# Patient Record
Sex: Female | Born: 1990 | Race: White | Hispanic: No | Marital: Single | Smoking: Current every day smoker
Health system: Southern US, Community
[De-identification: ages and names within clinical notes are randomized; demographics above are authoritative.]

## PROBLEM LIST (undated history)

## (undated) ENCOUNTER — Inpatient Hospital Stay (HOSPITAL_COMMUNITY): Payer: Self-pay

## (undated) DIAGNOSIS — F99 Mental disorder, not otherwise specified: Secondary | ICD-10-CM

## (undated) DIAGNOSIS — IMO0002 Reserved for concepts with insufficient information to code with codable children: Secondary | ICD-10-CM

## (undated) DIAGNOSIS — G8929 Other chronic pain: Secondary | ICD-10-CM

## (undated) DIAGNOSIS — T8859XA Other complications of anesthesia, initial encounter: Secondary | ICD-10-CM

## (undated) DIAGNOSIS — M549 Dorsalgia, unspecified: Secondary | ICD-10-CM

## (undated) HISTORY — PX: WISDOM TOOTH EXTRACTION: SHX21

## (undated) HISTORY — DX: Mental disorder, not otherwise specified: F99

## (undated) HISTORY — PX: NO PAST SURGERIES: SHX2092

## (undated) HISTORY — DX: Morbid (severe) obesity due to excess calories: E66.01

## (undated) HISTORY — DX: Other complications of anesthesia, initial encounter: T88.59XA

---
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
## 2010-09-04 ENCOUNTER — Emergency Department (HOSPITAL_COMMUNITY)
Admission: EM | Admit: 2010-09-04 | Discharge: 2010-09-04 | Disposition: A | Discharge status: Home / Self Care | Payer: Medicaid Other | Attending: Emergency Medicine | Admitting: Emergency Medicine

## 2010-09-04 DIAGNOSIS — M549 Dorsalgia, unspecified: Secondary | ICD-10-CM | POA: Insufficient documentation

## 2010-09-04 DIAGNOSIS — G8929 Other chronic pain: Secondary | ICD-10-CM | POA: Insufficient documentation

## 2010-09-04 DIAGNOSIS — M25539 Pain in unspecified wrist: Secondary | ICD-10-CM | POA: Insufficient documentation

## 2010-09-04 DIAGNOSIS — J45909 Unspecified asthma, uncomplicated: Secondary | ICD-10-CM | POA: Insufficient documentation

## 2010-09-04 DIAGNOSIS — Z79899 Other long term (current) drug therapy: Secondary | ICD-10-CM | POA: Insufficient documentation

## 2010-09-04 LAB — POCT PREGNANCY, URINE: Preg Test, Ur: NEGATIVE

## 2010-11-14 ENCOUNTER — Emergency Department (HOSPITAL_COMMUNITY)
Admission: EM | Admit: 2010-11-14 | Discharge: 2010-11-15 | Disposition: A | Discharge status: Home / Self Care | Payer: Medicaid Other | Attending: Emergency Medicine | Admitting: Emergency Medicine

## 2010-11-14 DIAGNOSIS — R21 Rash and other nonspecific skin eruption: Secondary | ICD-10-CM | POA: Insufficient documentation

## 2010-12-04 ENCOUNTER — Emergency Department (HOSPITAL_COMMUNITY)
Admission: EM | Admit: 2010-12-04 | Discharge: 2010-12-05 | Disposition: A | Discharge status: Home / Self Care | Payer: Medicaid Other | Attending: Emergency Medicine | Admitting: Emergency Medicine

## 2010-12-04 DIAGNOSIS — IMO0002 Reserved for concepts with insufficient information to code with codable children: Secondary | ICD-10-CM | POA: Insufficient documentation

## 2010-12-04 DIAGNOSIS — M542 Cervicalgia: Secondary | ICD-10-CM | POA: Insufficient documentation

## 2010-12-04 DIAGNOSIS — R110 Nausea: Secondary | ICD-10-CM | POA: Insufficient documentation

## 2010-12-04 DIAGNOSIS — W010XXA Fall on same level from slipping, tripping and stumbling without subsequent striking against object, initial encounter: Secondary | ICD-10-CM | POA: Insufficient documentation

## 2010-12-04 DIAGNOSIS — R109 Unspecified abdominal pain: Secondary | ICD-10-CM | POA: Insufficient documentation

## 2010-12-05 ENCOUNTER — Emergency Department (HOSPITAL_COMMUNITY): Payer: Medicaid Other

## 2010-12-05 LAB — URINALYSIS, ROUTINE W REFLEX MICROSCOPIC
Hgb urine dipstick: NEGATIVE
Ketones, ur: NEGATIVE mg/dL
Specific Gravity, Urine: 1.022 (ref 1.005–1.030)
Urobilinogen, UA: 1 mg/dL (ref 0.0–1.0)

## 2011-02-08 ENCOUNTER — Emergency Department (HOSPITAL_COMMUNITY)
Admission: EM | Admit: 2011-02-08 | Discharge: 2011-02-09 | Disposition: A | Discharge status: Home / Self Care | Payer: Self-pay | Attending: Emergency Medicine | Admitting: Emergency Medicine

## 2011-02-08 DIAGNOSIS — R109 Unspecified abdominal pain: Secondary | ICD-10-CM | POA: Insufficient documentation

## 2011-02-08 DIAGNOSIS — R1011 Right upper quadrant pain: Secondary | ICD-10-CM | POA: Insufficient documentation

## 2011-02-08 DIAGNOSIS — R110 Nausea: Secondary | ICD-10-CM | POA: Insufficient documentation

## 2011-02-08 DIAGNOSIS — J45909 Unspecified asthma, uncomplicated: Secondary | ICD-10-CM | POA: Insufficient documentation

## 2011-02-08 LAB — URINALYSIS, ROUTINE W REFLEX MICROSCOPIC
Glucose, UA: NEGATIVE mg/dL
Hgb urine dipstick: NEGATIVE
Ketones, ur: NEGATIVE mg/dL
Leukocytes, UA: NEGATIVE
pH: 5.5 (ref 5.0–8.0)

## 2011-02-09 ENCOUNTER — Emergency Department (HOSPITAL_COMMUNITY): Payer: Self-pay

## 2011-02-09 LAB — DIFFERENTIAL
Lymphocytes Relative: 30 % (ref 12–46)
Lymphs Abs: 2.4 10*3/uL (ref 0.7–4.0)
Monocytes Relative: 7 % (ref 3–12)
Neutrophils Relative %: 61 % (ref 43–77)

## 2011-02-09 LAB — COMPREHENSIVE METABOLIC PANEL
BUN: 8 mg/dL (ref 6–23)
CO2: 26 mEq/L (ref 19–32)
Chloride: 100 mEq/L (ref 96–112)
Creatinine, Ser: 0.66 mg/dL (ref 0.50–1.10)
GFR calc Af Amer: >60 mL/min (ref >60)
GFR calc non Af Amer: >60 mL/min (ref >60)
Glucose, Bld: 104 mg/dL — ABNORMAL HIGH (ref 70–99)
Total Bilirubin: 0.2 mg/dL — ABNORMAL LOW (ref 0.3–1.2)

## 2011-02-09 LAB — CBC
HCT: 39.7 % (ref 36.0–46.0)
MCV: 84.8 fL (ref 78.0–100.0)
RBC: 4.68 MIL/uL (ref 3.87–5.11)
WBC: 8.1 10*3/uL (ref 4.0–10.5)

## 2011-02-09 LAB — LIPASE, BLOOD: Lipase: 25 U/L (ref 11–59)

## 2011-04-09 ENCOUNTER — Inpatient Hospital Stay (HOSPITAL_COMMUNITY): Payer: Self-pay

## 2011-04-09 ENCOUNTER — Encounter (HOSPITAL_COMMUNITY): Payer: Self-pay | Admitting: *Deleted

## 2011-04-09 ENCOUNTER — Inpatient Hospital Stay (HOSPITAL_COMMUNITY)
Admission: AD | Admit: 2011-04-09 | Discharge: 2011-04-09 | Disposition: A | Discharge status: Home / Self Care | Payer: Self-pay | Source: Ambulatory Visit | Attending: Obstetrics & Gynecology | Admitting: Obstetrics & Gynecology

## 2011-04-09 DIAGNOSIS — O219 Vomiting of pregnancy, unspecified: Secondary | ICD-10-CM

## 2011-04-09 DIAGNOSIS — O210 Mild hyperemesis gravidarum: Secondary | ICD-10-CM | POA: Insufficient documentation

## 2011-04-09 HISTORY — DX: Dorsalgia, unspecified: M54.9

## 2011-04-09 HISTORY — DX: Other chronic pain: G89.29

## 2011-04-09 HISTORY — DX: Reserved for concepts with insufficient information to code with codable children: IMO0002

## 2011-04-09 LAB — HCG, QUANTITATIVE, PREGNANCY: hCG, Beta Chain, Quant, S: 8444 m[IU]/mL — ABNORMAL HIGH (ref <5)

## 2011-04-09 LAB — URINALYSIS, ROUTINE W REFLEX MICROSCOPIC
Glucose, UA: NEGATIVE mg/dL
Ketones, ur: NEGATIVE mg/dL
Leukocytes, UA: NEGATIVE
Nitrite: NEGATIVE
Protein, ur: NEGATIVE mg/dL

## 2011-04-09 LAB — WET PREP, GENITAL: Trich, Wet Prep: NONE SEEN

## 2011-04-09 LAB — POCT PREGNANCY, URINE: Preg Test, Ur: POSITIVE

## 2011-04-09 LAB — CBC
MCH: 27.7 pg (ref 26.0–34.0)
MCHC: 32.7 g/dL (ref 30.0–36.0)
Platelets: 270 10*3/uL (ref 150–400)
RBC: 4.65 MIL/uL (ref 3.87–5.11)

## 2011-04-09 MED ORDER — PROMETHAZINE HCL 25 MG PO TABS: 12.5000 mg | ORAL_TABLET | Freq: Four times a day (QID) | ORAL | Status: AC | PRN

## 2011-04-09 NOTE — Progress Notes (Signed)
Patient states she had a positive home pregnancy test on 10-26. Has had nausea and vomiting on and off since 10-27. Has some upper abdominal pain on and off with vomiting. Occasional lower sharp pain (twice in the past 4 days not today) Has not had any vomiting today. No bleeding or discharge.

## 2011-04-09 NOTE — ED Provider Notes (Signed)
History     CSN: [id number] Arrival date & time: 04/09/2011  3:56 PM   None     Chief Complaint  Patient presents with  . Emesis    HPI Cassandra Harrison is a 20 y.o. female who presents to MAU for nausea, vomiting, lower abdominal cramping and breast tenderness. She is past due for her period this month.  The history was provided by the patient.  Past Medical History  Diagnosis Date  . Asthma   . Chronic back pain   . Migraine   . Bulging disc     Past Surgical History  Procedure Date  . No past surgeries     No family history on file.  History  Substance Use Topics  . Smoking status: Current Everyday Smoker  . Smokeless tobacco: Not on file  . Alcohol Use: No    OB History    Grav Para Term Preterm Abortions TAB SAB Ect Mult Living   1               Review of Systems  Constitutional: Positive for fatigue. Negative for fever, chills and diaphoresis.  HENT: Negative for ear pain, congestion, sore throat, facial swelling, neck pain, neck stiffness, dental problem and sinus pressure.   Eyes: Negative for photophobia, pain and discharge.  Respiratory: Negative for cough, chest tightness and wheezing.   Gastrointestinal: Positive for nausea, vomiting and abdominal pain. Negative for diarrhea, constipation and abdominal distention.  Genitourinary: Positive for frequency and vaginal discharge. Negative for dysuria, flank pain, vaginal bleeding and difficulty urinating.  Musculoskeletal: Positive for back pain. Negative for myalgias and gait problem.       Chronic back pain.  Skin: Negative for color change and rash.  Neurological: Negative for dizziness, speech difficulty, weakness, light-headedness, numbness and headaches.  Psychiatric/Behavioral: Negative for confusion and agitation. The patient is not nervous/anxious.     Allergies  Latex  Home Medications  No current outpatient prescriptions on file.  BP 122/67  Pulse 91  Temp(Src) 98.6 F (37 C) (Oral)   Resp 16  Ht 5' 2.5" (1.588 m)  Wt 178 lb 6.4 oz (80.922 kg)  BMI 32.11 kg/m2  SpO2 99%  LMP 03/09/2011  Physical Exam  Nursing note and vitals reviewed. Constitutional: She is oriented to person, place, and time. She appears well-developed and well-nourished. No distress.  HENT:  Head: Normocephalic.  Eyes: EOM are normal.  Neck: Neck supple.  Cardiovascular: Normal rate.   Pulmonary/Chest: Effort normal.  Abdominal: Soft. There is no tenderness.  Genitourinary:       External genitalia without lesions. White discharge vaginal vault. Cervix closed and long. No CMT, no adnexal tenderness or mass palpable. Uterus with minimal enlargement.  Musculoskeletal: Normal range of motion.  Neurological: She is alert and oriented to person, place, and time. No cranial nerve deficit.  Skin: Skin is warm and dry.   Results for orders placed during the hospital encounter of 04/09/11 (from the past 24 hour(s))  HCG, QUANTITATIVE, PREGNANCY     Status: Abnormal   Collection Time   04/09/11  4:07 PM      Component Value Range   hCG, Beta Chain, Quant, S 8444 (*) <5 (mIU/mL)  URINALYSIS, ROUTINE W REFLEX MICROSCOPIC     Status: Normal   Collection Time   04/09/11  4:40 PM      Component Value Range   Color, Urine YELLOW  YELLOW    Appearance CLEAR  CLEAR    Specific  Gravity, Urine 1.025  1.005 - 1.030    pH 6.0  5.0 - 8.0    Glucose, UA NEGATIVE  NEGATIVE (mg/dL)   Hgb urine dipstick NEGATIVE  NEGATIVE    Bilirubin Urine NEGATIVE  NEGATIVE    Ketones, ur NEGATIVE  NEGATIVE (mg/dL)   Protein, ur NEGATIVE  NEGATIVE (mg/dL)   Urobilinogen, UA 0.2  0.0 - 1.0 (mg/dL)   Nitrite NEGATIVE  NEGATIVE    Leukocytes, UA NEGATIVE  NEGATIVE   POCT PREGNANCY, URINE     Status: Normal   Collection Time   04/09/11  5:00 PM      Component Value Range   Preg Test, Ur POSITIVE    WET PREP, GENITAL     Status: Abnormal   Collection Time   04/09/11  5:07 PM      Component Value Range   Yeast, Wet Prep  NONE SEEN  NONE SEEN    Trich, Wet Prep NONE SEEN  NONE SEEN    Clue Cells, Wet Prep NONE SEEN  NONE SEEN    WBC, Wet Prep HPF POC MANY (*) NONE SEEN   ABO/RH     Status: Normal   Collection Time   04/09/11  5:16 PM      Component Value Range   ABO/RH(D) B NEG    CBC     Status: Normal   Collection Time   04/09/11  5:16 PM      Component Value Range   WBC 7.7  4.0 - 10.5 (K/uL)   RBC 4.65  3.87 - 5.11 (MIL/uL)   Hemoglobin 12.9  12.0 - 15.0 (g/dL)   HCT 40.9  81.1 - 91.4 (%)   MCV 84.7  78.0 - 100.0 (fL)   MCH 27.7  26.0 - 34.0 (pg)   MCHC 32.7  30.0 - 36.0 (g/dL)   RDW 78.2  95.6 - 21.3 (%)   Platelets 270  150 - 400 (K/uL)   Assessment: Nausea and vomiting in early pregnancy  Plan:  Phenergan 12.5 mg po q 4 hours prn   Start prenatal care   Return here as needed for problems.  ED Course  Procedures  MDM          Kerrie Buffalo, NP 04/09/11 [phone]

## 2011-04-09 NOTE — Progress Notes (Signed)
Pt in c/o nausea and vomiting since Saturday, none today.  Also reports a dull, lower abdominal constant pain with intermittent sharp pains on left side (x1 Friday, x1 yesterday).  Denies any discharge or bleeding.

## 2011-05-29 ENCOUNTER — Inpatient Hospital Stay (HOSPITAL_COMMUNITY)
Admission: AD | Admit: 2011-05-29 | Discharge: 2011-05-29 | Disposition: A | Discharge status: Home / Self Care | Payer: Medicaid Other | Source: Ambulatory Visit | Attending: Obstetrics & Gynecology | Admitting: Obstetrics & Gynecology

## 2011-05-29 ENCOUNTER — Encounter (HOSPITAL_COMMUNITY): Payer: Self-pay | Admitting: *Deleted

## 2011-05-29 DIAGNOSIS — O210 Mild hyperemesis gravidarum: Secondary | ICD-10-CM | POA: Insufficient documentation

## 2011-05-29 DIAGNOSIS — O26899 Other specified pregnancy related conditions, unspecified trimester: Secondary | ICD-10-CM

## 2011-05-29 DIAGNOSIS — O219 Vomiting of pregnancy, unspecified: Secondary | ICD-10-CM

## 2011-05-29 DIAGNOSIS — O99891 Other specified diseases and conditions complicating pregnancy: Secondary | ICD-10-CM | POA: Insufficient documentation

## 2011-05-29 DIAGNOSIS — R109 Unspecified abdominal pain: Secondary | ICD-10-CM | POA: Insufficient documentation

## 2011-05-29 DIAGNOSIS — K219 Gastro-esophageal reflux disease without esophagitis: Secondary | ICD-10-CM | POA: Insufficient documentation

## 2011-05-29 LAB — URINALYSIS, ROUTINE W REFLEX MICROSCOPIC
Bilirubin Urine: NEGATIVE
Glucose, UA: NEGATIVE mg/dL
Hgb urine dipstick: NEGATIVE
Ketones, ur: 15 mg/dL — AB
Protein, ur: NEGATIVE mg/dL

## 2011-05-29 LAB — CBC
HCT: 36.2 % (ref 36.0–46.0)
MCV: 82.8 fL (ref 78.0–100.0)
RDW: 12.9 % (ref 11.5–15.5)
WBC: 7 10*3/uL (ref 4.0–10.5)

## 2011-05-29 LAB — COMPREHENSIVE METABOLIC PANEL
Albumin: 3.4 g/dL — ABNORMAL LOW (ref 3.5–5.2)
BUN: 8 mg/dL (ref 6–23)
CO2: 22 mEq/L (ref 19–32)
Chloride: 100 mEq/L (ref 96–112)
Creatinine, Ser: 0.54 mg/dL (ref 0.50–1.10)
GFR calc non Af Amer: >90 mL/min (ref >90)
Total Bilirubin: 0.3 mg/dL (ref 0.3–1.2)

## 2011-05-29 MED ORDER — ONDANSETRON 4 MG PO TBDP
4.0000 mg | ORAL_TABLET | Freq: Three times a day (TID) | ORAL | Status: AC | PRN
Start: 2011-05-29 — End: 2011-06-05

## 2011-05-29 MED ORDER — LACTATED RINGERS IV BOLUS (SEPSIS)
1000.0000 mL | Freq: Once | INTRAVENOUS | Status: AC
  Administered 2011-05-29: 1000 mL via INTRAVENOUS

## 2011-05-29 MED ORDER — ONDANSETRON 8 MG PO TBDP
8.0000 mg | ORAL_TABLET | Freq: Once | ORAL | Status: AC
  Administered 2011-05-29: 8 mg via ORAL
  Filled 2011-05-29: qty 1

## 2011-05-29 MED ORDER — GI COCKTAIL ~~LOC~~
30.0000 mL | Freq: Once | ORAL | Status: AC
  Administered 2011-05-29: 30 mL via ORAL
  Filled 2011-05-29: qty 30

## 2011-05-29 MED ORDER — FAMOTIDINE 20 MG PO TABS: 20.0000 mg | ORAL_TABLET | Freq: Two times a day (BID) | ORAL | Status: DC

## 2011-05-29 NOTE — Progress Notes (Signed)
Pt states her stomach has hurt, she has been nauseated and had 1 epsiode of diarrhea this morning

## 2011-05-29 NOTE — Progress Notes (Signed)
Patient is here with c/o n/v and abdominal cramping all day. She states that she is unable to keep fluids and meal down. Denies any vaginal bleeding, or discharge

## 2011-05-29 NOTE — ED Provider Notes (Signed)
History     Chief Complaint  Patient presents with  . Abdominal Pain   HPI 20 y.o. G1P0 at [redacted]w[redacted]d with abd pain, has been having generalized abdominal pain only when hungry since onset of pregnancy, pain worse today, not relieved by eating, tried cereal with milk and McChicken Sandwich. Nausea, no vomiting, 1 episode of diarrhea this morning. No pelvic pain or vaginal bleeding/discharge. Has not tried any meds for her symptoms. Planning on prenatal care at [facility].    Past Medical History  Diagnosis Date  . Asthma   . Chronic back pain   . Migraine   . Bulging disc   . Constipation     Past Surgical History  Procedure Date  . No past surgeries     History reviewed. No pertinent family history.  History  Substance Use Topics  . Smoking status: Current Everyday Smoker  . Smokeless tobacco: Not on file  . Alcohol Use: No    Allergies:  Allergies  Allergen Reactions  . Latex Rash    Patient states that she is allergic to latex condoms.  They give her a rash and a yeast infection.    Prescriptions prior to admission  Medication Sig Dispense Refill  . albuterol (PROVENTIL HFA;VENTOLIN HFA) 108 (90 BASE) MCG/ACT inhaler Inhale into the lungs every 6 (six) hours as needed.        [name] ibuprofen (ADVIL,MOTRIN) 200 MG tablet Take 400 mg by mouth every 6 (six) hours as needed. Patient took this medication for a headache.         Review of Systems  Constitutional: Negative.   Respiratory: Negative.   Cardiovascular: Negative.   Gastrointestinal: Positive for nausea, abdominal pain and diarrhea. Negative for vomiting and constipation.  Genitourinary: Negative for dysuria, urgency, frequency, hematuria and flank pain.       Negative for vaginal bleeding, vaginal discharge  Musculoskeletal: Negative.   Neurological: Negative.   Psychiatric/Behavioral: Negative.    Physical Exam   Blood pressure 114/58, pulse 90, temperature 99 F (37.2 C), temperature source Oral, resp. rate  20, height 5\' 2"  (1.575 m), weight 177 lb (80.287 kg), last menstrual period 03/09/2011, SpO2 99.00%.  Physical Exam  Nursing note and vitals reviewed. Constitutional: She is oriented to person, place, and time. She appears well-developed and well-nourished. No distress.  Cardiovascular: Normal rate.   Respiratory: Effort normal. No respiratory distress.  GI: Soft. She exhibits no distension and no mass. There is no tenderness. There is no rebound and no guarding.  Musculoskeletal: Normal range of motion.  Neurological: She is alert and oriented to person, place, and time.  Skin: Skin is warm and dry.  Psychiatric: She has a normal mood and affect.    MAU Course  Procedures Results for orders placed during the hospital encounter of 05/29/11 (from the past 24 hour(s))  URINALYSIS, ROUTINE W REFLEX MICROSCOPIC     Status: Abnormal   Collection Time   05/29/11  9:00 PM      Component Value Range   Color, Urine YELLOW  YELLOW    APPearance CLEAR  CLEAR    Specific Gravity, Urine >1.030 (*) 1.005 - 1.030    pH 6.0  5.0 - 8.0    Glucose, UA NEGATIVE  NEGATIVE (mg/dL)   Hgb urine dipstick NEGATIVE  NEGATIVE    Bilirubin Urine NEGATIVE  NEGATIVE    Ketones, ur 15 (*) NEGATIVE (mg/dL)   Protein, ur NEGATIVE  NEGATIVE (mg/dL)   Urobilinogen, UA 1.0  0.0 -  1.0 (mg/dL)   Nitrite NEGATIVE  NEGATIVE    Leukocytes, UA NEGATIVE  NEGATIVE   CBC     Status: Normal   Collection Time   05/29/11  9:25 PM      Component Value Range   WBC 7.0  4.0 - 10.5 (K/uL)   RBC 4.37  3.87 - 5.11 (MIL/uL)   Hemoglobin 12.2  12.0 - 15.0 (g/dL)   HCT 40.9  81.1 - 91.4 (%)   MCV 82.8  78.0 - 100.0 (fL)   MCH 27.9  26.0 - 34.0 (pg)   MCHC 33.7  30.0 - 36.0 (g/dL)   RDW 78.2  95.6 - 21.3 (%)   Platelets 199  150 - 400 (K/uL)  COMPREHENSIVE METABOLIC PANEL     Status: Abnormal   Collection Time   05/29/11  9:25 PM      Component Value Range   Sodium 132 (*) 135 - 145 (mEq/L)   Potassium 3.8  3.5 - 5.1  (mEq/L)   Chloride 100  96 - 112 (mEq/L)   CO2 22  19 - 32 (mEq/L)   Glucose, Bld 99  70 - 99 (mg/dL)   BUN 8  6 - 23 (mg/dL)   Creatinine, Ser 0.86  0.50 - 1.10 (mg/dL)   Calcium 9.6  8.4 - 57.8 (mg/dL)   Total Protein 6.8  6.0 - 8.3 (g/dL)   Albumin 3.4 (*) 3.5 - 5.2 (g/dL)   AST 13  0 - 37 (U/L)   ALT 11  0 - 35 (U/L)   Alkaline Phosphatase 48  39 - 117 (U/L)   Total Bilirubin 0.3  0.3 - 1.2 (mg/dL)   GFR calc non Af Amer >90  >90 (mL/min)   GFR calc Af Amer >90  >90 (mL/min)   IV hydration, nausea and pain improved with Zofran ODT and GI cocktail, tolerating po gingerale and crackers  Assessment and Plan  20 y.o. G1P0 at [redacted]w[redacted]d Pregnancy nausea/GERD - rx Zofran and Pepcid F/U for prenatal care as soon as possible  Linnette Panella 05/29/2011, 9:18 PM

## 2011-06-12 NOTE — L&D Delivery Note (Signed)
Delivery Note At 8:02 AM a viable unspecified sex was delivered via Vaginal, Spontaneous Delivery (Presentation: ;  ).  APGAR: , ; weight .   Placenta status: , .  Cord: 3 vessels with the following complications: None.  Cord pH: not done  Anesthesia: Epidural  Episiotomy: None Lacerations: None Suture Repair: 2.0 Est. Blood Loss (mL):   Mom to postpartum.  Baby to nursery-stable.  Mihika Surrette A 12/08/2011, 8:10 AM

## 2011-07-04 LAB — OB RESULTS CONSOLE RPR
RPR: NONREACTIVE
RPR: NONREACTIVE

## 2011-07-04 LAB — OB RESULTS CONSOLE HIV ANTIBODY (ROUTINE TESTING): HIV: NONREACTIVE

## 2011-09-18 ENCOUNTER — Other Ambulatory Visit: Payer: Self-pay | Admitting: Obstetrics

## 2011-09-18 ENCOUNTER — Encounter (HOSPITAL_COMMUNITY): Payer: Self-pay | Admitting: *Deleted

## 2011-09-18 ENCOUNTER — Inpatient Hospital Stay (HOSPITAL_COMMUNITY)
Admission: AD | Admit: 2011-09-18 | Discharge: 2011-09-18 | Disposition: A | Discharge status: Home / Self Care | Payer: Medicaid Other | Source: Ambulatory Visit | Attending: Obstetrics | Admitting: Obstetrics

## 2011-09-18 ENCOUNTER — Other Ambulatory Visit (HOSPITAL_COMMUNITY): Payer: Self-pay | Admitting: *Deleted

## 2011-09-18 DIAGNOSIS — Z2989 Encounter for other specified prophylactic measures: Secondary | ICD-10-CM | POA: Insufficient documentation

## 2011-09-18 DIAGNOSIS — Z298 Encounter for other specified prophylactic measures: Secondary | ICD-10-CM | POA: Insufficient documentation

## 2011-09-18 MED ORDER — RHO D IMMUNE GLOBULIN 1500 UNIT/2ML IJ SOLN
300.0000 ug | Freq: Once | INTRAMUSCULAR | Status: AC
  Administered 2011-09-18: 300 ug via INTRAMUSCULAR
  Filled 2011-09-18: qty 2

## 2011-09-18 NOTE — MAU Note (Signed)
Pt states, " I am here for Rhogam."

## 2011-09-19 LAB — RH IG WORKUP (INCLUDES ABO/RH)
ABO/RH(D): B NEG
Gestational Age(Wks): 28

## 2011-10-01 ENCOUNTER — Inpatient Hospital Stay (HOSPITAL_COMMUNITY)
Admission: AD | Admit: 2011-10-01 | Discharge: 2011-10-01 | Disposition: A | Discharge status: Hospice | Payer: Medicaid Other | Source: Ambulatory Visit | Attending: Obstetrics | Admitting: Obstetrics

## 2011-10-01 ENCOUNTER — Encounter (HOSPITAL_COMMUNITY): Payer: Self-pay | Admitting: *Deleted

## 2011-10-01 DIAGNOSIS — O99891 Other specified diseases and conditions complicating pregnancy: Secondary | ICD-10-CM | POA: Insufficient documentation

## 2011-10-01 DIAGNOSIS — R079 Chest pain, unspecified: Secondary | ICD-10-CM | POA: Insufficient documentation

## 2011-10-01 DIAGNOSIS — K30 Functional dyspepsia: Secondary | ICD-10-CM

## 2011-10-01 DIAGNOSIS — R1013 Epigastric pain: Secondary | ICD-10-CM

## 2011-10-01 DIAGNOSIS — O26899 Other specified pregnancy related conditions, unspecified trimester: Secondary | ICD-10-CM

## 2011-10-01 DIAGNOSIS — R109 Unspecified abdominal pain: Secondary | ICD-10-CM | POA: Insufficient documentation

## 2011-10-01 LAB — URINALYSIS, ROUTINE W REFLEX MICROSCOPIC
Bilirubin Urine: NEGATIVE
Glucose, UA: NEGATIVE mg/dL
Hgb urine dipstick: NEGATIVE
Ketones, ur: NEGATIVE mg/dL
Nitrite: NEGATIVE
Protein, ur: NEGATIVE mg/dL
Specific Gravity, Urine: 1.02 (ref 1.005–1.030)
Urobilinogen, UA: 0.2 mg/dL (ref 0.0–1.0)
pH: 6.5 (ref 5.0–8.0)

## 2011-10-01 MED ORDER — ONDANSETRON 8 MG PO TBDP
8.0000 mg | ORAL_TABLET | Freq: Once | ORAL | Status: AC
  Administered 2011-10-01: 8 mg via ORAL
  Filled 2011-10-01: qty 1

## 2011-10-01 MED ORDER — GI COCKTAIL ~~LOC~~
30.0000 mL | Freq: Once | ORAL | Status: AC
  Administered 2011-10-01: 30 mL via ORAL
  Filled 2011-10-01: qty 30

## 2011-10-01 NOTE — MAU Note (Signed)
Pt 's O2 Sat is 98-99%; laughing and talking with her friend at present'; pt does not appear to be having any distress with her heart or breathing; c/o pain over entire abdomen;;

## 2011-10-01 NOTE — MAU Provider Note (Signed)
History     CSN: [id number]  Arrival date and time: 10/01/11 5784   First Provider Initiated Contact with Patient 10/01/11 1854      Chief Complaint  Patient presents with  . Abdominal Pain  . Chest Pain   HPI Cassandra Harrison is 21 y.o. G1P0000 [redacted]w[redacted]d weeks presenting with abdominal pain that began at 5am went back to sleep and woke up at 9:30 and then it progressively worsened.   Tightening of her chest at began at 3:50 after she got off work.  Hx of asthma but doesn't feel it is that.  Her 02 Sats are 99-100% here.  Doesn't know where her inhaler is but will find it tonight.  Ate cheeseburger, FF and hushpuppies at 2:30.  Hasn't tried an antiacid for discomfort.  Has had constipation lately with last BM today.  Has nausea without vomiting.  Patient is laughing.  Exposed to "stomach flu" X 3 over the last 2 weeks.     Past Medical History  Diagnosis Date  . Asthma   . Chronic back pain   . Migraine   . Bulging disc   . Constipation     Past Surgical History  Procedure Date  . No past surgeries     Family History  Problem Relation Age of Onset  . Diabetes Mother   . COPD Mother   . Heart disease Mother   . COPD Father   . Heart disease Father   . Cancer Sister   . Cancer Paternal Aunt   . Diabetes Maternal Grandmother   . Cancer Maternal Grandmother   . COPD Paternal Grandmother   . Heart disease Paternal Grandfather     History  Substance Use Topics  . Smoking status: Current Everyday Smoker -- 0.2 packs/day  . Smokeless tobacco: Not on file  . Alcohol Use: No    Allergies:  Allergies  Allergen Reactions  . Latex Rash    Patient states that she is allergic to latex condoms.  They give her a rash and a yeast infection.    Prescriptions prior to admission  Medication Sig Dispense Refill  . acetaminophen (TYLENOL) 500 MG tablet Take 1,000 mg by mouth every 6 (six) hours as needed. Patient used this medication for pain.       [name] albuterol (PROVENTIL  HFA;VENTOLIN HFA) 108 (90 BASE) MCG/ACT inhaler Inhale into the lungs every 6 (six) hours as needed.        [name] dextromethorphan-guaiFENesin (ROBITUSSIN-DM) 10-100 MG/5ML liquid Take 5 mLs by mouth every 4 (four) hours as needed. Patient used this medication for congestion and cold.       . famotidine (PEPCID) 20 MG tablet Take 1 tablet (20 mg total) by mouth 2 (two) times daily.  60 tablet  3    ROS Physical Exam   Blood pressure 109/64, pulse 97, temperature 97.1 F (36.2 C), temperature source Oral, resp. rate 18, height 5' 2.5" (1.588 m), weight 96.163 kg (212 lb), last menstrual period 03/09/2011, SpO2 100.00%.  Physical Exam  Constitutional: She is oriented to person, place, and time. She appears well-developed and well-nourished. No distress.  HENT:  Head: Normocephalic.  Neck: Normal range of motion.  Cardiovascular: Normal rate.   Respiratory: Effort normal.  GI: There is no tenderness. There is no rebound and no guarding.  Neurological: She is alert and oriented to person, place, and time.  Skin: Skin is warm and dry.   CERVICAL EXAM BY Dee, RN--high, closed  MAU Course  Procedures  FMS baseline fetal heart rate is 140, moderate variability, no decels, contractions not seen, 10x10s seen  MDM GI Cocktail ordered.  At 10:00 patient states her chest tightness has resolved.  Nausea persists.  Will  Rx for Zofran.   21:00 patient states her nausea is better after Zofran.  Is ready to go home.  Assessment and Plan  A:  Abdominal pain at [redacted] wks gestation  P:  Instructed to avoid greasy, fried foods.      Keep appointment with Dr. Gaynell Face for tomorrow-encouraged to tell Dr. Gaynell Face about visit tonight.     Winchester Ducre,EVE M 10/01/2011, 6:57 PM

## 2011-10-01 NOTE — Discharge Instructions (Signed)
Abdominal Pain During Pregnancy Abdominal discomfort is common in pregnancy. Most of the time, it does not cause harm. There are many causes of abdominal pain. Some causes are more serious than others. Some of the causes of abdominal pain in pregnancy are easily diagnosed. Occasionally, the diagnosis takes time to understand. Other times, the cause is not determined. Abdominal pain can be a sign that something is very wrong with the pregnancy, or the pain may have nothing to do with the pregnancy at all. For this reason, always tell your caregiver if you have any abdominal discomfort. CAUSES Common and harmless causes of abdominal pain include:  Constipation.   Excess gas and bloating.   Round ligament pain. This is pain that is felt in the folds of the groin.   The position the baby or placenta is in.   Baby kicks.   Braxton-Hicks contractions. These are mild contractions that do not cause cervical dilation.  Serious causes of abdominal pain include:  Ectopic pregnancy. This happens when a fertilized egg implants outside of the uterus.   Miscarriage.   Preterm labor. This is when labor starts at less than 37 weeks of pregnancy.   Placental abruption. This is when the placenta partially or completely separates from the uterus.   Preeclampsia. This is often associated with high blood pressure and has been referred to as "toxemia in pregnancy."   Uterine or amniotic fluid infections.  Causes unrelated to pregnancy include:  Urinary tract infection.   Gallbladder stones or inflammation.   Hepatitis or other liver illness.   Intestinal problems, stomach flu, food poisoning, or ulcer.   Appendicitis.   Kidney (renal) stones.   Kidney infection (pylonephritis).  HOME CARE INSTRUCTIONS  For mild pain:  Do not have sexual intercourse or put anything in your vagina until your symptoms go away completely.   Get plenty of rest until your pain improves. If your pain does not  improve in 1 hour, call your caregiver.   Drink clear fluids if you feel nauseous. Avoid solid food as long as you are uncomfortable or nauseous.   Only take medicine as directed by your caregiver.   Keep all follow-up appointments with your caregiver.  SEEK IMMEDIATE MEDICAL CARE IF:  You are bleeding, leaking fluid, or passing tissue from the vagina.   You have increasing pain or cramping.   You have persistent vomiting.   You have painful or bloody urination.   You have a fever.   You notice a decrease in your baby's movements.   You have extreme weakness or feel faint.   You have shortness of breath, with or without abdominal pain.   You develop a severe headache with abdominal pain.   You have abnormal vaginal discharge with abdominal pain.   You have persistent diarrhea.   You have abdominal pain that continues even after rest, or gets worse.  MAKE SURE YOU:   Understand these instructions.   Will watch your condition.   Will get help right away if you are not doing well or get worse.  Document Released: 05/28/2005 Document Revised: 05/17/2011 Document Reviewed: 12/22/2010 [facility] Patient Information 2012 [place], [place].Heartburn During Pregnancy  Heartburn happens when stomach acid goes up into the esophagus. The esophagus is the tube between the mouth and the stomach. This acid causes a burning pain in the chest or throat. This happens more often in the later part of pregnancy because the womb (uterus) gets larger. It may also happen because of hormone  changes. Heartburn problems often go away after giving birth. HOME CARE  Take all medicine as told by your doctor.   Raise the head of your bed with blocks only as told by your doctor.   Do not exercise right after eating.   Avoid eating 2 or 3 hours before bed. Do not lie down right after eating.   Eat small meals throughout the day instead of 3 large meals.   Avoid foods that give you heartburn. Foods  you may want to avoid include:   Peppers.   Chocolate.   High-fat foods, including fried foods.   Spicy foods.   Garlic and onions.   Citrus fruits, including oranges, grapefruit, lemons, and limes.   Food containing tomatoes or tomato products.   Mint.   Bubbly (carbonated) drinks and drinks with caffeine.   Vinegar.  GET HELP RIGHT AWAY IF:  You have bad chest pain that goes down your arm or into your jaw or neck.   You feel sweaty, dizzy, or lightheaded.   You have trouble breathing.   You throw up (vomit) blood.   You have trouble or pain when swallowing.   You have bloody or black poop (stool).   You have heartburn more than 3 times a week, for more than 2 weeks.  MAKE SURE YOU:  Understand these instructions.   Will watch your condition.   Will get help right away if you are not doing well or get worse.  Document Released: 06/30/2010 Document Revised: 05/17/2011 Document Reviewed: 10/15/2010 [facility] Patient Information 2012 [place], [place].

## 2011-10-01 NOTE — MAU Note (Signed)
This morning, woke up, stomach was hurting. Went back to sleep, woke up at 0930- stomach still hurting but not was bad.  Went to work. Hurt off/ on during the day.  Now chest is feeling tight, ? Asthma related. Has been nausea- nothing coming up, just burping.

## 2011-11-30 ENCOUNTER — Encounter (HOSPITAL_COMMUNITY): Payer: Self-pay

## 2011-11-30 ENCOUNTER — Inpatient Hospital Stay (HOSPITAL_COMMUNITY)
Admission: AD | Admit: 2011-11-30 | Discharge: 2011-11-30 | Disposition: A | Discharge status: Home / Self Care | Payer: Medicaid Other | Source: Ambulatory Visit | Attending: Obstetrics | Admitting: Obstetrics

## 2011-11-30 DIAGNOSIS — N949 Unspecified condition associated with female genital organs and menstrual cycle: Secondary | ICD-10-CM | POA: Insufficient documentation

## 2011-11-30 DIAGNOSIS — O99891 Other specified diseases and conditions complicating pregnancy: Secondary | ICD-10-CM | POA: Insufficient documentation

## 2011-11-30 DIAGNOSIS — R109 Unspecified abdominal pain: Secondary | ICD-10-CM | POA: Insufficient documentation

## 2011-11-30 LAB — URINALYSIS, ROUTINE W REFLEX MICROSCOPIC
Bilirubin Urine: NEGATIVE
Leukocytes, UA: NEGATIVE
Nitrite: NEGATIVE
Specific Gravity, Urine: 1.025 (ref 1.005–1.030)
Urobilinogen, UA: 0.2 mg/dL (ref 0.0–1.0)
pH: 6 (ref 5.0–8.0)

## 2011-11-30 MED ORDER — OXYCODONE-ACETAMINOPHEN 5-325 MG PO TABS
2.0000 | ORAL_TABLET | Freq: Once | ORAL | Status: AC
  Administered 2011-11-30: 2 via ORAL
  Filled 2011-11-30: qty 2

## 2011-11-30 NOTE — Discharge Instructions (Signed)

## 2011-11-30 NOTE — MAU Note (Signed)
Pt states, " I've had pelvic pain and cramping since 11am. Sometimes it goes away and then comes right back."

## 2011-12-05 ENCOUNTER — Encounter (HOSPITAL_COMMUNITY): Payer: Self-pay | Admitting: *Deleted

## 2011-12-05 ENCOUNTER — Telehealth (HOSPITAL_COMMUNITY): Payer: Self-pay | Admitting: *Deleted

## 2011-12-05 NOTE — Telephone Encounter (Signed)
Preadmission screen  

## 2011-12-07 ENCOUNTER — Encounter (HOSPITAL_COMMUNITY): Payer: Self-pay

## 2011-12-07 ENCOUNTER — Encounter (HOSPITAL_COMMUNITY): Payer: Self-pay | Admitting: Anesthesiology

## 2011-12-07 ENCOUNTER — Inpatient Hospital Stay (HOSPITAL_COMMUNITY): Payer: Medicaid Other | Admitting: Anesthesiology

## 2011-12-07 ENCOUNTER — Inpatient Hospital Stay (HOSPITAL_COMMUNITY)
Admission: RE | Admit: 2011-12-07 | Discharge: 2011-12-10 | DRG: 775 | Disposition: A | Discharge status: Home / Self Care | Payer: Medicaid Other | Source: Ambulatory Visit | Attending: Obstetrics | Admitting: Obstetrics

## 2011-12-07 LAB — CBC
Hemoglobin: 11.2 g/dL — ABNORMAL LOW (ref 12.0–15.0)
MCH: 26.6 pg (ref 26.0–34.0)
MCHC: 32.9 g/dL (ref 30.0–36.0)
MCV: 80.8 fL (ref 78.0–100.0)
Platelets: 219 10*3/uL (ref 150–400)
RBC: 4.21 MIL/uL (ref 3.87–5.11)

## 2011-12-07 LAB — TYPE AND SCREEN

## 2011-12-07 MED ORDER — LIDOCAINE HCL (PF) 1 % IJ SOLN
30.0000 mL | INTRAMUSCULAR | Status: DC | PRN
  Filled 2011-12-07: qty 30

## 2011-12-07 MED ORDER — DIPHENHYDRAMINE HCL 50 MG/ML IJ SOLN
12.5000 mg | INTRAMUSCULAR | Status: DC | PRN
Start: 2011-12-07 — End: 2011-12-10
  Administered 2011-12-07 (×2): 12.5 mg via INTRAVENOUS
  Filled 2011-12-07 (×2): qty 1

## 2011-12-07 MED ORDER — CITRIC ACID-SODIUM CITRATE 334-500 MG/5ML PO SOLN: 30.0000 mL | ORAL | Status: DC | PRN

## 2011-12-07 MED ORDER — ONDANSETRON HCL 4 MG/2ML IJ SOLN: 4.0000 mg | Freq: Four times a day (QID) | INTRAMUSCULAR | Status: DC | PRN

## 2011-12-07 MED ORDER — TERBUTALINE SULFATE 1 MG/ML IJ SOLN: 0.2500 mg | Freq: Once | INTRAMUSCULAR | Status: AC | PRN

## 2011-12-07 MED ORDER — LACTATED RINGERS IV SOLN: 500.0000 mL | Freq: Once | INTRAVENOUS | Status: DC

## 2011-12-07 MED ORDER — OXYCODONE-ACETAMINOPHEN 5-325 MG PO TABS: 1.0000 | ORAL_TABLET | ORAL | Status: DC | PRN

## 2011-12-07 MED ORDER — LIDOCAINE HCL (PF) 1 % IJ SOLN
INTRAMUSCULAR | Status: DC | PRN
  Administered 2011-12-07 (×2): 5 mL

## 2011-12-07 MED ORDER — OXYTOCIN BOLUS FROM INFUSION
250.0000 mL | Freq: Once | INTRAVENOUS | Status: DC
  Filled 2011-12-07: qty 500

## 2011-12-07 MED ORDER — FLEET ENEMA 7-19 GM/118ML RE ENEM: 1.0000 | ENEMA | RECTAL | Status: DC | PRN

## 2011-12-07 MED ORDER — PHENYLEPHRINE 40 MCG/ML (10ML) SYRINGE FOR IV PUSH (FOR BLOOD PRESSURE SUPPORT): 80.0000 ug | PREFILLED_SYRINGE | INTRAVENOUS | Status: DC | PRN

## 2011-12-07 MED ORDER — OXYTOCIN 40 UNITS IN LACTATED RINGERS INFUSION - SIMPLE MED
1.0000 m[IU]/min | INTRAVENOUS | Status: DC
  Administered 2011-12-07: 1 m[IU]/min via INTRAVENOUS
  Filled 2011-12-07: qty 1000

## 2011-12-07 MED ORDER — LACTATED RINGERS IV SOLN
INTRAVENOUS | Status: DC
  Administered 2011-12-07 – 2011-12-08 (×5): via INTRAVENOUS

## 2011-12-07 MED ORDER — LACTATED RINGERS IV SOLN
500.0000 mL | INTRAVENOUS | Status: DC | PRN
  Administered 2011-12-08: 300 mL via INTRAVENOUS

## 2011-12-07 MED ORDER — EPHEDRINE 5 MG/ML INJ
10.0000 mg | INTRAVENOUS | Status: DC | PRN
  Filled 2011-12-07: qty 4

## 2011-12-07 MED ORDER — ACETAMINOPHEN 325 MG PO TABS: 650.0000 mg | ORAL_TABLET | ORAL | Status: DC | PRN

## 2011-12-07 MED ORDER — EPHEDRINE 5 MG/ML INJ: 10.0000 mg | INTRAVENOUS | Status: DC | PRN

## 2011-12-07 MED ORDER — BUTORPHANOL TARTRATE 2 MG/ML IJ SOLN
1.0000 mg | INTRAMUSCULAR | Status: DC | PRN
  Administered 2011-12-07 (×2): 1 mg via INTRAVENOUS
  Filled 2011-12-07 (×2): qty 1

## 2011-12-07 MED ORDER — PHENYLEPHRINE 40 MCG/ML (10ML) SYRINGE FOR IV PUSH (FOR BLOOD PRESSURE SUPPORT)
80.0000 ug | PREFILLED_SYRINGE | INTRAVENOUS | Status: DC | PRN
  Filled 2011-12-07: qty 5

## 2011-12-07 MED ORDER — IBUPROFEN 600 MG PO TABS
600.0000 mg | ORAL_TABLET | Freq: Four times a day (QID) | ORAL | Status: DC | PRN
  Filled 2011-12-07 (×2): qty 1

## 2011-12-07 MED ORDER — OXYTOCIN 40 UNITS IN LACTATED RINGERS INFUSION - SIMPLE MED
62.5000 mL/h | Freq: Once | INTRAVENOUS | Status: AC
  Administered 2011-12-08: 999 mL/h via INTRAVENOUS

## 2011-12-07 MED ORDER — FENTANYL 2.5 MCG/ML BUPIVACAINE 1/10 % EPIDURAL INFUSION (WH - ANES)
14.0000 mL/h | INTRAMUSCULAR | Status: DC
  Administered 2011-12-07 – 2011-12-08 (×4): 14 mL/h via EPIDURAL
  Filled 2011-12-07 (×4): qty 60

## 2011-12-07 NOTE — Anesthesia Procedure Notes (Signed)
Epidural Patient location during procedure: OB Start time: 12/07/2011 7:09 PM  Staffing Anesthesiologist: Brayton Caves R Performed by: anesthesiologist   Preanesthetic Checklist Completed: patient identified, site marked, surgical consent, pre-op evaluation, timeout performed, IV checked, risks and benefits discussed and monitors and equipment checked  Epidural Patient position: sitting Prep: site prepped and draped and DuraPrep Patient monitoring: continuous pulse ox and blood pressure Approach: midline Injection technique: LOR air and LOR saline  Needle:  Needle type: Tuohy  Needle gauge: 17 G Needle length: 9 cm Needle insertion depth: 6 cm Catheter type: closed end flexible Catheter size: 19 Gauge Catheter at skin depth: 11 cm Test dose: negative  Assessment Events: blood not aspirated, injection not painful, no injection resistance, negative IV test and no paresthesia  Additional Notes Patient identified.  Risk benefits discussed including failed block, incomplete pain control, headache, nerve damage, paralysis, blood pressure changes, nausea, vomiting, reactions to medication both toxic or allergic, and postpartum back pain.  Patient expressed understanding and wished to proceed.  All questions were answered.  Sterile technique used throughout procedure and epidural site dressed with sterile barrier dressing. No paresthesia or other complications noted.The patient did not experience any signs of intravascular injection such as tinnitus or metallic taste in mouth nor signs of intrathecal spread such as rapid motor block. Please see nursing notes for vital signs.

## 2011-12-07 NOTE — H&P (Signed)
This is Dr. Francoise Ceo dictating the history and physical on   Cassandra Harrison she's a 21 year old gravida 1 at 40 weeks and 2 days loose desires induction her EDC is 12/05/2011 she's on low-dose Pitocin cervix was 1 cm 90% vertex -2 amniotomy was performed fluid clear GBS was negative Past medical history negative Past surgical history negative Social history negative System review noncontributory Physical exam well-developed female in no distress HEENT negative Lungs clear to P&A Heart regular rhythm no murmurs no gallops Abdomen term estimated fetal weight 6 lbs. 12 oz. Pelvic as described above Extremities negative and and and and

## 2011-12-07 NOTE — Anesthesia Preprocedure Evaluation (Signed)
Anesthesia Evaluation  Patient identified by MRN, date of birth, ID band Patient awake    Reviewed: Allergy & Precautions, H&P , Patient's Chart, lab work & pertinent test results  Airway Mallampati: III TM Distance: >3 FB Neck ROM: full    Dental No notable dental hx.    Pulmonary neg pulmonary ROS, asthma ,  breath sounds clear to auscultation  Pulmonary exam normal       Cardiovascular negative cardio ROS  Rhythm:regular Rate:Normal     Neuro/Psych  Headaches, negative neurological ROS  negative psych ROS   GI/Hepatic negative GI ROS, Neg liver ROS,   Endo/Other  negative endocrine ROSMorbid obesity  Renal/GU negative Renal ROS     Musculoskeletal   Abdominal   Peds  Hematology negative hematology ROS (+)   Anesthesia Other Findings Chronic back pain     Migraine        Bulging disc     Constipation        Asthma   last used 11/29/11    Reproductive/Obstetrics (+) Pregnancy                           Anesthesia Physical Anesthesia Plan  ASA: III  Anesthesia Plan: Epidural   Post-op Pain Management:    Induction:   Airway Management Planned:   Additional Equipment:   Intra-op Plan:   Post-operative Plan:   Informed Consent: I have reviewed the patients History and Physical, chart, labs and discussed the procedure including the risks, benefits and alternatives for the proposed anesthesia with the patient or authorized representative who has indicated his/her understanding and acceptance.     Plan Discussed with:   Anesthesia Plan Comments:         Anesthesia Quick Evaluation

## 2011-12-08 ENCOUNTER — Encounter (HOSPITAL_COMMUNITY): Payer: Self-pay

## 2011-12-08 MED ORDER — ONDANSETRON HCL 4 MG/2ML IJ SOLN: 4.0000 mg | INTRAMUSCULAR | Status: DC | PRN

## 2011-12-08 MED ORDER — DIPHENHYDRAMINE HCL 25 MG PO CAPS: 25.0000 mg | ORAL_CAPSULE | Freq: Four times a day (QID) | ORAL | Status: DC | PRN

## 2011-12-08 MED ORDER — DIBUCAINE 1 % RE OINT
1.0000 "application " | TOPICAL_OINTMENT | RECTAL | Status: DC | PRN
  Filled 2011-12-08: qty 28

## 2011-12-08 MED ORDER — IBUPROFEN 600 MG PO TABS
600.0000 mg | ORAL_TABLET | Freq: Four times a day (QID) | ORAL | Status: DC
  Administered 2011-12-08 – 2011-12-10 (×7): 600 mg via ORAL
  Filled 2011-12-08 (×5): qty 1

## 2011-12-08 MED ORDER — SENNOSIDES-DOCUSATE SODIUM 8.6-50 MG PO TABS
2.0000 | ORAL_TABLET | Freq: Every day | ORAL | Status: DC
  Administered 2011-12-08 – 2011-12-09 (×2): 2 via ORAL

## 2011-12-08 MED ORDER — FERROUS SULFATE 325 (65 FE) MG PO TABS
325.0000 mg | ORAL_TABLET | Freq: Two times a day (BID) | ORAL | Status: DC
  Administered 2011-12-08 – 2011-12-10 (×4): 325 mg via ORAL
  Filled 2011-12-08 (×5): qty 1

## 2011-12-08 MED ORDER — LANOLIN HYDROUS EX OINT: TOPICAL_OINTMENT | CUTANEOUS | Status: DC | PRN

## 2011-12-08 MED ORDER — OXYCODONE-ACETAMINOPHEN 5-325 MG PO TABS
1.0000 | ORAL_TABLET | ORAL | Status: DC | PRN
  Administered 2011-12-09: 1 via ORAL
  Filled 2011-12-08: qty 1

## 2011-12-08 MED ORDER — SIMETHICONE 80 MG PO CHEW: 80.0000 mg | CHEWABLE_TABLET | ORAL | Status: DC | PRN

## 2011-12-08 MED ORDER — ONDANSETRON HCL 4 MG PO TABS: 4.0000 mg | ORAL_TABLET | ORAL | Status: DC | PRN

## 2011-12-08 MED ORDER — BENZOCAINE-MENTHOL 20-0.5 % EX AERO
1.0000 "application " | INHALATION_SPRAY | CUTANEOUS | Status: DC | PRN
  Administered 2011-12-08: 1 via TOPICAL
  Filled 2011-12-08 (×2): qty 56

## 2011-12-08 MED ORDER — WITCH HAZEL-GLYCERIN EX PADS: 1.0000 "application " | MEDICATED_PAD | CUTANEOUS | Status: DC | PRN

## 2011-12-08 MED ORDER — PRENATAL MULTIVITAMIN CH
1.0000 | ORAL_TABLET | Freq: Every day | ORAL | Status: DC
  Administered 2011-12-09 – 2011-12-10 (×2): 1 via ORAL
  Filled 2011-12-08 (×2): qty 1

## 2011-12-08 MED ORDER — TETANUS-DIPHTH-ACELL PERTUSSIS 5-2.5-18.5 LF-MCG/0.5 IM SUSP
0.5000 mL | Freq: Once | INTRAMUSCULAR | Status: AC
  Administered 2011-12-09: 0.5 mL via INTRAMUSCULAR
  Filled 2011-12-08: qty 0.5

## 2011-12-08 MED ORDER — ZOLPIDEM TARTRATE 5 MG PO TABS: 5.0000 mg | ORAL_TABLET | Freq: Every evening | ORAL | Status: DC | PRN

## 2011-12-08 NOTE — Addendum Note (Signed)
Addendum  created 12/08/11 1948 by Len Blalock, CRNA   Modules edited:Charges VN, Notes Section

## 2011-12-08 NOTE — Anesthesia Postprocedure Evaluation (Signed)
  Anesthesia Post-op Note  Patient: Cassandra Harrison  Procedure(s) Performed: * No procedures listed *  Patient Location: PACU and Mother/Baby  Anesthesia Type: Epidural  Level of Consciousness: awake, alert  and oriented  Airway and Oxygen Therapy: Patient Spontanous Breathing     Post-op Assessment: Patient's Cardiovascular Status Stable and Respiratory Function Stable  Post-op Vital Signs: stable  Complications: No apparent anesthesia complications

## 2011-12-09 LAB — CBC
MCHC: 32.6 g/dL (ref 30.0–36.0)
Platelets: 176 10*3/uL (ref 150–400)
RDW: 15.4 % (ref 11.5–15.5)

## 2011-12-09 MED ORDER — RHO D IMMUNE GLOBULIN 1500 UNIT/2ML IJ SOLN
300.0000 ug | Freq: Once | INTRAMUSCULAR | Status: AC
  Administered 2011-12-09: 300 ug via INTRAMUSCULAR
  Filled 2011-12-09: qty 2

## 2011-12-09 NOTE — Progress Notes (Signed)
Patient ID: Cassandra Harrison, female   DOB: 09-26-1990, 21 y.o.   MRN: [id number] Postpartum day one Vital signs normal Fundus firm Legs negative No complaints

## 2011-12-10 LAB — RH IG WORKUP (INCLUDES ABO/RH)
ABO/RH(D): B NEG
Gestational Age(Wks): 40
Unit division: 0

## 2011-12-10 MED ORDER — PNEUMOCOCCAL VAC POLYVALENT 25 MCG/0.5ML IJ INJ
0.5000 mL | INJECTION | Freq: Once | INTRAMUSCULAR | Status: AC
  Administered 2011-12-10: 0.5 mL via INTRAMUSCULAR
  Filled 2011-12-10: qty 0.5

## 2011-12-10 NOTE — Progress Notes (Signed)
UR chart review completed.  

## 2011-12-10 NOTE — Discharge Summary (Signed)
Obstetric Discharge Summary Reason for Admission: induction of labor Prenatal Procedures: none Intrapartum Procedures: spontaneous vaginal delivery Postpartum Procedures: none Complications-Operative and Postpartum: none Hemoglobin  Date Value Range Status  12/09/2011 9.7* 12.0 - 15.0 g/dL Final     HCT  Date Value Range Status  12/09/2011 29.8* 36.0 - 46.0 % Final    Physical Exam:  General: alert Lochia: appropriate Uterine Fundus: firm Incision: healing well DVT Evaluation: No evidence of DVT seen on physical exam.  Discharge Diagnoses: Term Pregnancy-delivered  Discharge Information: Date: 12/10/2011 Activity: pelvic rest Diet: routine Medications: Percocet Condition: stable Instructions: refer to practice specific booklet Discharge to: home Follow-up Information    Follow up with Haadi Santellan A, MD. Call in 6 weeks.   Contact information:   [address] [place] [address] [phone]          Newborn Data: Live born female  Birth Weight: 7 lb 11.3 oz (3495 g) APGAR: 8, 9  Home with mother.  Rebie Peale A 12/10/2011, 6:27 AM

## 2012-05-25 ENCOUNTER — Encounter (HOSPITAL_COMMUNITY): Payer: Self-pay | Admitting: Nurse Practitioner

## 2012-05-25 ENCOUNTER — Emergency Department (HOSPITAL_COMMUNITY): Payer: Self-pay

## 2012-05-25 ENCOUNTER — Emergency Department (HOSPITAL_COMMUNITY)
Admission: EM | Admit: 2012-05-25 | Discharge: 2012-05-25 | Disposition: A | Discharge status: Home / Self Care | Payer: Self-pay | Attending: Emergency Medicine | Admitting: Emergency Medicine

## 2012-05-25 DIAGNOSIS — Z79899 Other long term (current) drug therapy: Secondary | ICD-10-CM | POA: Insufficient documentation

## 2012-05-25 DIAGNOSIS — F17200 Nicotine dependence, unspecified, uncomplicated: Secondary | ICD-10-CM | POA: Insufficient documentation

## 2012-05-25 DIAGNOSIS — R059 Cough, unspecified: Secondary | ICD-10-CM | POA: Insufficient documentation

## 2012-05-25 DIAGNOSIS — G8929 Other chronic pain: Secondary | ICD-10-CM | POA: Insufficient documentation

## 2012-05-25 DIAGNOSIS — J45909 Unspecified asthma, uncomplicated: Secondary | ICD-10-CM | POA: Insufficient documentation

## 2012-05-25 DIAGNOSIS — R05 Cough: Secondary | ICD-10-CM

## 2012-05-25 DIAGNOSIS — R062 Wheezing: Secondary | ICD-10-CM | POA: Insufficient documentation

## 2012-05-25 DIAGNOSIS — Z8719 Personal history of other diseases of the digestive system: Secondary | ICD-10-CM | POA: Insufficient documentation

## 2012-05-25 DIAGNOSIS — Z8679 Personal history of other diseases of the circulatory system: Secondary | ICD-10-CM | POA: Insufficient documentation

## 2012-05-25 MED ORDER — PREDNISONE 20 MG PO TABS
60.0000 mg | ORAL_TABLET | Freq: Once | ORAL | Status: AC
  Administered 2012-05-25: 60 mg via ORAL
  Filled 2012-05-25: qty 3

## 2012-05-25 MED ORDER — IPRATROPIUM BROMIDE 0.02 % IN SOLN
0.5000 mg | Freq: Once | RESPIRATORY_TRACT | Status: AC
  Administered 2012-05-25: 0.5 mg via RESPIRATORY_TRACT
  Filled 2012-05-25: qty 2.5

## 2012-05-25 MED ORDER — ALBUTEROL SULFATE (5 MG/ML) 0.5% IN NEBU
5.0000 mg | INHALATION_SOLUTION | Freq: Once | RESPIRATORY_TRACT | Status: AC
  Administered 2012-05-25: 5 mg via RESPIRATORY_TRACT
  Filled 2012-05-25: qty 1

## 2012-05-25 MED ORDER — ALBUTEROL SULFATE HFA 108 (90 BASE) MCG/ACT IN AERS: 2.0000 | INHALATION_SPRAY | RESPIRATORY_TRACT | Status: DC | PRN

## 2012-05-25 MED ORDER — PREDNISONE 20 MG PO TABS: 60.0000 mg | ORAL_TABLET | Freq: Every day | ORAL | Status: DC

## 2012-05-25 NOTE — ED Provider Notes (Addendum)
History   This chart was scribed for Cassandra Roots, MD by Melba Coon, ED Scribe. The patient was seen in room TR09C/TR09C and the patient's care was started at 3:10PM.    CSN: [id number]  Arrival date & time 05/25/12  1355   None     Chief Complaint  Patient presents with  . URI    (Consider location/radiation/quality/duration/timing/severity/associated sxs/prior treatment) The history is provided by the patient. No language interpreter was used.   Cassandra Harrison is a 21 y.o. female who presents to the Emergency Department complaining of constant, moderate to severe URI symptoms to include: cough, cold, and congestion with an onset 2 days ago that has gotten progressively worse. She has never experienced these type of symptoms in their current severity. Reports sore throat and rhinorrhea. Denies HA, fever, neck pain, rash, back pain, CP, SOB, abdominal pain, nausea, emesis, diarrhea, dysuria, or extremity pain, edema, weakness, numbness, or tingling. She has a Hx of asthma. No known allergies. No other pertinent medical symptoms. She is a current smoker.   Past Medical History  Diagnosis Date  . Chronic back pain   . Migraine   . Bulging disc   . Constipation   . Asthma     last used 11/29/11    Past Surgical History  Procedure Date  . No past surgeries     Family History  Problem Relation Age of Onset  . Diabetes Mother   . COPD Mother   . Heart disease Mother   . COPD Father   . Heart disease Father   . Cancer Sister   . Cancer Paternal Aunt   . Diabetes Maternal Grandmother   . Cancer Maternal Grandmother   . COPD Paternal Grandmother   . Heart disease Paternal Grandfather   . Other Neg Hx     History  Substance Use Topics  . Smoking status: Current Every Day Smoker -- 0.2 packs/day  . Smokeless tobacco: Not on file  . Alcohol Use: No    OB History    Grav Para Term Preterm Abortions TAB SAB Ect Mult Living   1 1 1  0 0 0 0 0 0 1      Review  of Systems 10 Systems reviewed and all are negative for acute change except as noted in the HPI.   Allergies  Latex  Home Medications   Current Outpatient Rx  Name  Route  Sig  Dispense  Refill  . ALBUTEROL SULFATE HFA 108 (90 BASE) MCG/ACT IN AERS   Inhalation   Inhale 2 puffs into the lungs every 6 (six) hours as needed. For shortness of breath         . BECLOMETHASONE DIPROPIONATE 80 MCG/ACT IN AERS   Inhalation   Inhale 1 puff into the lungs 2 (two) times daily.         [name] DM-PHENYLEPHRINE-ACETAMINOPHEN 10-5-325 MG PO CAPS   Oral   Take 1 packet by mouth at bedtime as needed. For cough         . GUAIFENESIN 100 MG/5ML PO LIQD   Oral   Take 200 mg by mouth 3 (three) times daily as needed. For cough           BP 116/79  Pulse 90  Temp 97.9 F (36.6 C) (Oral)  Resp 20  SpO2 97%  LMP 05/25/2012  Physical Exam  Nursing note and vitals reviewed. Constitutional: She is oriented to person, place, and time. She appears well-developed and well-nourished.  No distress.  HENT:  Nose: Nose normal.  Mouth/Throat: Oropharynx is clear and moist.  Eyes: Conjunctivae normal are normal. No scleral icterus.  Neck: Normal range of motion. Neck supple. No tracheal deviation present.  Cardiovascular: Normal rate, regular rhythm, normal heart sounds and intact distal pulses.  Exam reveals no gallop and no friction rub.   No murmur heard. Pulmonary/Chest: Effort normal. No respiratory distress. She has wheezes.  Abdominal: Soft. Normal appearance and bowel sounds are normal. She exhibits no distension. There is no tenderness.  Musculoskeletal: She exhibits no edema and no tenderness.  Neurological: She is alert and oriented to person, place, and time.  Skin: Skin is warm and dry. No rash noted.  Psychiatric: She has a normal mood and affect.    ED Course  Procedures (including critical care time)  COORDINATION OF CARE:  3:13PM - CXR and breathing treatment will be ordered  for [name].   4:11PM - imaging results reviewed and are unremarkable.   *RADIOLOGY REPORT*  Clinical Data: Cough. Chest congestion.  CHEST - 2 VIEW  Comparison: None.  Findings: The heart size and mediastinal contours are within normal limits. Both lungs are clear. The visualized skeletal structures are unremarkable.  IMPRESSION: No active cardiopulmonary disease.   Original Report Authenticated By: Myles Rosenthal, M.D.      MDM  I personally performed the services described in this documentation, which was scribed in my presence. The recorded information has been reviewed and is accurate.  Albuterol and atrovent neb. Cxr. pred po.  Reviewed nursing notes and prior charts for additional history.   Recheck wheezing improved. cxr neg. Hx/exam c/w viral uri and asthma/wheezing.   Persistent wheezing. Albuterol and atrovent neb.    Recheck good air exhange, no resp difficulty, stable for d/c.      Cassandra Roots, MD 05/25/12 1739

## 2012-05-25 NOTE — ED Notes (Signed)
Pt c/o cough, cold, congestion x 2 days. No fevers.

## 2013-01-30 ENCOUNTER — Encounter (HOSPITAL_COMMUNITY): Payer: Self-pay

## 2013-01-30 ENCOUNTER — Emergency Department (HOSPITAL_COMMUNITY): Payer: Medicaid Other

## 2013-01-30 ENCOUNTER — Emergency Department (HOSPITAL_COMMUNITY)
Admission: EM | Admit: 2013-01-30 | Discharge: 2013-01-30 | Disposition: A | Discharge status: Home / Self Care | Payer: Medicaid Other | Attending: Emergency Medicine | Admitting: Emergency Medicine

## 2013-01-30 DIAGNOSIS — F17200 Nicotine dependence, unspecified, uncomplicated: Secondary | ICD-10-CM | POA: Insufficient documentation

## 2013-01-30 DIAGNOSIS — IMO0002 Reserved for concepts with insufficient information to code with codable children: Secondary | ICD-10-CM | POA: Insufficient documentation

## 2013-01-30 DIAGNOSIS — G8929 Other chronic pain: Secondary | ICD-10-CM | POA: Insufficient documentation

## 2013-01-30 DIAGNOSIS — Z8719 Personal history of other diseases of the digestive system: Secondary | ICD-10-CM | POA: Insufficient documentation

## 2013-01-30 DIAGNOSIS — R0789 Other chest pain: Secondary | ICD-10-CM | POA: Insufficient documentation

## 2013-01-30 DIAGNOSIS — Z8739 Personal history of other diseases of the musculoskeletal system and connective tissue: Secondary | ICD-10-CM | POA: Insufficient documentation

## 2013-01-30 DIAGNOSIS — J45901 Unspecified asthma with (acute) exacerbation: Secondary | ICD-10-CM | POA: Insufficient documentation

## 2013-01-30 DIAGNOSIS — R000 Tachycardia, unspecified: Secondary | ICD-10-CM | POA: Insufficient documentation

## 2013-01-30 DIAGNOSIS — R059 Cough, unspecified: Secondary | ICD-10-CM | POA: Insufficient documentation

## 2013-01-30 DIAGNOSIS — Z91040 Latex allergy status: Secondary | ICD-10-CM | POA: Insufficient documentation

## 2013-01-30 DIAGNOSIS — J40 Bronchitis, not specified as acute or chronic: Secondary | ICD-10-CM | POA: Insufficient documentation

## 2013-01-30 DIAGNOSIS — R05 Cough: Secondary | ICD-10-CM | POA: Insufficient documentation

## 2013-01-30 DIAGNOSIS — Z8679 Personal history of other diseases of the circulatory system: Secondary | ICD-10-CM | POA: Insufficient documentation

## 2013-01-30 MED ORDER — ALBUTEROL SULFATE (5 MG/ML) 0.5% IN NEBU
5.0000 mg | INHALATION_SOLUTION | Freq: Once | RESPIRATORY_TRACT | Status: AC
  Administered 2013-01-30: 5 mg via RESPIRATORY_TRACT
  Filled 2013-01-30 (×2): qty 1

## 2013-01-30 MED ORDER — PREDNISONE 20 MG PO TABS
60.0000 mg | ORAL_TABLET | Freq: Once | ORAL | Status: AC
  Administered 2013-01-30: 60 mg via ORAL
  Filled 2013-01-30: qty 3

## 2013-01-30 MED ORDER — ALBUTEROL SULFATE (5 MG/ML) 0.5% IN NEBU
5.0000 mg | INHALATION_SOLUTION | Freq: Once | RESPIRATORY_TRACT | Status: AC
  Administered 2013-01-30: 5 mg via RESPIRATORY_TRACT
  Filled 2013-01-30: qty 1

## 2013-01-30 MED ORDER — HYDROCODONE-ACETAMINOPHEN 5-325 MG PO TABS: 1.0000 | ORAL_TABLET | ORAL | Status: DC | PRN

## 2013-01-30 MED ORDER — IPRATROPIUM BROMIDE 0.02 % IN SOLN
0.5000 mg | Freq: Once | RESPIRATORY_TRACT | Status: AC
  Administered 2013-01-30: 0.5 mg via RESPIRATORY_TRACT
  Filled 2013-01-30: qty 2.5

## 2013-01-30 MED ORDER — PREDNISONE 20 MG PO TABS: 40.0000 mg | ORAL_TABLET | Freq: Every day | ORAL | Status: DC

## 2013-01-30 MED ORDER — HYDROCODONE-ACETAMINOPHEN 5-325 MG PO TABS
1.0000 | ORAL_TABLET | Freq: Once | ORAL | Status: AC
Start: 2013-01-30 — End: 2013-01-30
  Administered 2013-01-30: 1 via ORAL
  Filled 2013-01-30: qty 1

## 2013-01-30 MED ORDER — OXYCODONE-ACETAMINOPHEN 5-325 MG PO TABS
1.0000 | ORAL_TABLET | Freq: Once | ORAL | Status: DC
  Filled 2013-01-30: qty 1

## 2013-01-30 NOTE — ED Notes (Signed)
Patient transported to X-ray 

## 2013-01-30 NOTE — Discharge Instructions (Signed)

## 2013-01-30 NOTE — ED Provider Notes (Signed)
CSN: [id number]     Arrival date & time 01/30/13  1645 History     First MD Initiated Contact with Patient 01/30/13 1712     Chief Complaint  Patient presents with  . URI   (Consider location/radiation/quality/duration/timing/severity/associated sxs/prior Treatment) The history is provided by the patient.   patient's had chest tightness and cough for the last 3 days. She's had some wheezing. She states she's had contact with someone with similar symptoms. She's a history of asthma. She states she's been taking her medications. She states she is dull chest tightness. Some mild sputum production, no fevers. No lightheadedness or dizziness. No Swelling. She does not smoke. Past Medical History  Diagnosis Date  . Chronic back pain   . Migraine   . Bulging disc   . Constipation   . Asthma     last used 11/29/11   Past Surgical History  Procedure Laterality Date  . No past surgeries     Family History  Problem Relation Age of Onset  . Diabetes Mother   . COPD Mother   . Heart disease Mother   . COPD Father   . Heart disease Father   . Cancer Sister   . Cancer Paternal Aunt   . Diabetes Maternal Grandmother   . Cancer Maternal Grandmother   . COPD Paternal Grandmother   . Heart disease Paternal Grandfather   . Other Neg Hx    History  Substance Use Topics  . Smoking status: Current Every Day Smoker -- 0.25 packs/day  . Smokeless tobacco: Not on file  . Alcohol Use: No   OB History   Grav Para Term Preterm Abortions TAB SAB Ect Mult Living   1 1 1  0 0 0 0 0 0 1     Review of Systems  Constitutional: Negative for activity change and appetite change.  HENT: Negative for neck stiffness.   Eyes: Negative for pain.  Respiratory: Positive for cough, chest tightness and shortness of breath.   Cardiovascular: Negative for chest pain and leg swelling.  Gastrointestinal: Negative for nausea, vomiting, abdominal pain and diarrhea.  Genitourinary: Negative for flank pain.   Musculoskeletal: Negative for back pain.  Skin: Negative for rash.  Neurological: Negative for weakness, numbness and headaches.  Psychiatric/Behavioral: Negative for behavioral problems.    Allergies  Latex  Home Medications   Current Outpatient Rx  Name  Route  Sig  Dispense  Refill  . albuterol (PROVENTIL HFA;VENTOLIN HFA) 108 (90 BASE) MCG/ACT inhaler   Inhalation   Inhale 2 puffs into the lungs every 6 (six) hours as needed. For shortness of breath         . albuterol (PROVENTIL HFA;VENTOLIN HFA) 108 (90 BASE) MCG/ACT inhaler   Inhalation   Inhale 2 puffs into the lungs every 4 (four) hours as needed for wheezing.   1 Inhaler   3   . beclomethasone (QVAR) 80 MCG/ACT inhaler   Inhalation   Inhale 1 puff into the lungs 2 (two) times daily.         [name] DM-Phenylephrine-Acetaminophen (ALKA-SELTZER PLS SINUS & COUGH) 10-5-325 MG CAPS   Oral   Take 1 packet by mouth at bedtime as needed. For cough         . guaiFENesin (ROBITUSSIN) 100 MG/5ML liquid   Oral   Take 200 mg by mouth 3 (three) times daily as needed. For cough         . HYDROcodone-acetaminophen (NORCO/VICODIN) 5-325 MG per tablet   Oral  Take 1 tablet by mouth every 4 (four) hours as needed for pain (cough).   10 tablet   0   . predniSONE (DELTASONE) 20 MG tablet   Oral   Take 3 tablets (60 mg total) by mouth daily.   12 tablet   0   . predniSONE (DELTASONE) 20 MG tablet   Oral   Take 2 tablets (40 mg total) by mouth daily.   6 tablet   0    BP 109/65  Pulse 109  Temp(Src) 98.2 F (36.8 C)  Resp 19  SpO2 100%  LMP 01/26/2013  Breastfeeding? No Physical Exam  Nursing note and vitals reviewed. Constitutional: She is oriented to person, place, and time. She appears well-developed and well-nourished.  HENT:  Head: Normocephalic and atraumatic.  Eyes: EOM are normal. Pupils are equal, round, and reactive to light.  Neck: Normal range of motion. Neck supple.  Cardiovascular: Regular  rhythm and normal heart sounds.   No murmur heard. Mild tachycardia  Pulmonary/Chest: Effort normal. No respiratory distress. She has wheezes. She has no rales.  Mild diffuse expiratory wheezes  Abdominal: Soft. Bowel sounds are normal. She exhibits no distension. There is no tenderness. There is no rebound and no guarding.  Musculoskeletal: Normal range of motion.  Neurological: She is alert and oriented to person, place, and time. No cranial nerve deficit.  Skin: Skin is warm and dry.  Psychiatric: She has a normal mood and affect. Her speech is normal.    ED Course   Procedures (including critical care time)  Labs Reviewed - No data to display Dg Chest 2 View (if Patient Has Fever And/or Copd)  01/30/2013   *RADIOLOGY REPORT*  Clinical Data:  Cough, cold symptoms, history asthma and smoking  CHEST - 2 VIEW  Comparison: 05/25/2012  Findings: Normal heart size, mediastinal contours, and pulmonary vascularity. Mild chronic peribronchial thickening. Lungs otherwise clear. No pleural effusion or pneumothorax. No acute bony abnormalities.  IMPRESSION: Chronic peribronchial thickening which could reflect chronic bronchitis or asthma. No acute infiltrate.   Original Report Authenticated By: Ulyses Southward, M.D.   1. Bronchitis     MDM  Patient with shortness of breath. Likely bronchitis. X-ray does not show acute infiltrate, patient will be discharged home. She does not appear to be in severe respiratory distress.  Juliet Rude. Rubin Payor, MD 01/31/13 [phone]

## 2013-01-30 NOTE — ED Notes (Signed)
Chest congestion, tight cough. Nasal congested, headache   Chest tightness

## 2013-02-21 ENCOUNTER — Encounter (HOSPITAL_COMMUNITY): Payer: Self-pay

## 2013-02-21 ENCOUNTER — Emergency Department (HOSPITAL_COMMUNITY): Payer: Medicaid Other

## 2013-02-21 ENCOUNTER — Emergency Department (HOSPITAL_COMMUNITY)
Admission: EM | Admit: 2013-02-21 | Discharge: 2013-02-21 | Disposition: A | Discharge status: Home / Self Care | Payer: Medicaid Other | Attending: Emergency Medicine | Admitting: Emergency Medicine

## 2013-02-21 DIAGNOSIS — Z79899 Other long term (current) drug therapy: Secondary | ICD-10-CM | POA: Insufficient documentation

## 2013-02-21 DIAGNOSIS — F17200 Nicotine dependence, unspecified, uncomplicated: Secondary | ICD-10-CM | POA: Insufficient documentation

## 2013-02-21 DIAGNOSIS — J45901 Unspecified asthma with (acute) exacerbation: Secondary | ICD-10-CM | POA: Insufficient documentation

## 2013-02-21 DIAGNOSIS — Z8719 Personal history of other diseases of the digestive system: Secondary | ICD-10-CM | POA: Insufficient documentation

## 2013-02-21 DIAGNOSIS — IMO0001 Reserved for inherently not codable concepts without codable children: Secondary | ICD-10-CM | POA: Insufficient documentation

## 2013-02-21 DIAGNOSIS — Z91040 Latex allergy status: Secondary | ICD-10-CM | POA: Insufficient documentation

## 2013-02-21 DIAGNOSIS — J3489 Other specified disorders of nose and nasal sinuses: Secondary | ICD-10-CM

## 2013-02-21 DIAGNOSIS — R51 Headache: Secondary | ICD-10-CM | POA: Insufficient documentation

## 2013-02-21 DIAGNOSIS — R5381 Other malaise: Secondary | ICD-10-CM | POA: Insufficient documentation

## 2013-02-21 DIAGNOSIS — H9209 Otalgia, unspecified ear: Secondary | ICD-10-CM | POA: Insufficient documentation

## 2013-02-21 DIAGNOSIS — IMO0002 Reserved for concepts with insufficient information to code with codable children: Secondary | ICD-10-CM | POA: Insufficient documentation

## 2013-02-21 DIAGNOSIS — H53149 Visual discomfort, unspecified: Secondary | ICD-10-CM | POA: Insufficient documentation

## 2013-02-21 DIAGNOSIS — J029 Acute pharyngitis, unspecified: Secondary | ICD-10-CM | POA: Insufficient documentation

## 2013-02-21 DIAGNOSIS — G8929 Other chronic pain: Secondary | ICD-10-CM | POA: Insufficient documentation

## 2013-02-21 DIAGNOSIS — J40 Bronchitis, not specified as acute or chronic: Secondary | ICD-10-CM

## 2013-02-21 DIAGNOSIS — Z8679 Personal history of other diseases of the circulatory system: Secondary | ICD-10-CM | POA: Insufficient documentation

## 2013-02-21 DIAGNOSIS — R0789 Other chest pain: Secondary | ICD-10-CM | POA: Insufficient documentation

## 2013-02-21 MED ORDER — BENZONATATE 100 MG PO CAPS
ORAL_CAPSULE | ORAL | Status: AC
  Filled 2013-02-21: qty 1

## 2013-02-21 MED ORDER — ALBUTEROL (5 MG/ML) CONTINUOUS INHALATION SOLN
10.0000 mg/h | INHALATION_SOLUTION | RESPIRATORY_TRACT | Status: DC
  Administered 2013-02-21: 10 mg/h via RESPIRATORY_TRACT

## 2013-02-21 MED ORDER — MOMETASONE FUROATE 50 MCG/ACT NA SUSP: 2.0000 | Freq: Every day | NASAL | Status: DC

## 2013-02-21 MED ORDER — OXYMETAZOLINE HCL 0.05 % NA SOLN: 2.0000 | Freq: Two times a day (BID) | NASAL | Status: DC

## 2013-02-21 MED ORDER — PREDNISONE 20 MG PO TABS: 40.0000 mg | ORAL_TABLET | Freq: Every day | ORAL | Status: DC

## 2013-02-21 MED ORDER — IBUPROFEN 200 MG PO TABS
ORAL_TABLET | ORAL | Status: AC
  Filled 2013-02-21: qty 3

## 2013-02-21 MED ORDER — BENZONATATE 100 MG PO CAPS
100.0000 mg | ORAL_CAPSULE | Freq: Once | ORAL | Status: AC
  Administered 2013-02-21: 100 mg via ORAL

## 2013-02-21 MED ORDER — BENZONATATE 100 MG PO CAPS: 100.0000 mg | ORAL_CAPSULE | Freq: Three times a day (TID) | ORAL | Status: DC

## 2013-02-21 MED ORDER — IBUPROFEN 200 MG PO TABS
600.0000 mg | ORAL_TABLET | Freq: Once | ORAL | Status: AC
  Administered 2013-02-21: 600 mg via ORAL

## 2013-02-21 MED ORDER — METHYLPREDNISOLONE SODIUM SUCC 125 MG IJ SOLR
125.0000 mg | Freq: Once | INTRAMUSCULAR | Status: AC
  Administered 2013-02-21: 125 mg via INTRAVENOUS

## 2013-02-21 MED ORDER — ALBUTEROL (5 MG/ML) CONTINUOUS INHALATION SOLN
INHALATION_SOLUTION | RESPIRATORY_TRACT | Status: AC
  Filled 2013-02-21: qty 20

## 2013-02-21 MED ORDER — METHYLPREDNISOLONE SODIUM SUCC 125 MG IJ SOLR
INTRAMUSCULAR | Status: AC
  Filled 2013-02-21: qty 2

## 2013-02-21 NOTE — ED Notes (Signed)
Pt states cold x 2 days with coughing and sinus pressure.  Pt has pain behind ears and pain in chest with coughing.  Pt also states sore throat.  Pt was taking Nyquil at night.

## 2013-02-21 NOTE — ED Provider Notes (Signed)
CSN: [id number]     Arrival date & time 02/21/13  0012 History   First MD Initiated Contact with Patient 02/21/13 0146     Chief Complaint  Patient presents with  . Cough   (Consider location/radiation/quality/duration/timing/severity/associated sxs/prior Treatment) Patient is a 22 y.o. female presenting with cough.  Cough Associated symptoms: ear pain, headaches, myalgias, rhinorrhea, shortness of breath, sore throat and wheezing   Associated symptoms: no chest pain, no chills, no fever and no rash    Patient has had 2 days of URI symptoms characterized as nasal congestion, coughing, sinus pressure, bilateral ear pain and sore throat. She states that she has had some chest tightness and wheezing as well. She has diffuse myalgias but denies fever. He has a history of asthma and has been using her rescue inhaler. No sick contacts. No lower extremity swelling or edema. Past Medical History  Diagnosis Date  . Chronic back pain   . Migraine   . Bulging disc   . Constipation   . Asthma     last used 11/29/11   Past Surgical History  Procedure Laterality Date  . No past surgeries     Family History  Problem Relation Age of Onset  . Diabetes Mother   . COPD Mother   . Heart disease Mother   . COPD Father   . Heart disease Father   . Cancer Sister   . Cancer Paternal Aunt   . Diabetes Maternal Grandmother   . Cancer Maternal Grandmother   . COPD Paternal Grandmother   . Heart disease Paternal Grandfather   . Other Neg Hx    History  Substance Use Topics  . Smoking status: Current Every Day Smoker -- 0.25 packs/day  . Smokeless tobacco: Not on file  . Alcohol Use: No   OB History   Grav Para Term Preterm Abortions TAB SAB Ect Mult Living   1 1 1  0 0 0 0 0 0 1     Review of Systems  Constitutional: Positive for fatigue. Negative for fever and chills.  HENT: Positive for ear pain, congestion, sore throat, rhinorrhea and sinus pressure. Negative for neck pain and neck  stiffness.   Eyes: Positive for photophobia. Negative for visual disturbance.  Respiratory: Positive for cough, chest tightness, shortness of breath and wheezing.   Cardiovascular: Negative for chest pain.  Gastrointestinal: Negative for nausea, vomiting and abdominal pain.  Musculoskeletal: Positive for myalgias.  Skin: Negative for rash and wound.  Neurological: Positive for headaches. Negative for dizziness, weakness, light-headedness and numbness.  All other systems reviewed and are negative.    Allergies  Olive oil and Latex  Home Medications   Current Outpatient Rx  Name  Route  Sig  Dispense  Refill  . DM-Phenylephrine-Acetaminophen (ALKA-SELTZER PLS SINUS & COUGH) 10-5-325 MG CAPS   Oral   Take 1 packet by mouth at bedtime as needed. For cough         . Pseudoeph-Doxylamine-DM-APAP (NYQUIL PO)   Oral   Take 10 mLs by mouth at bedtime as needed (cold symptoms).         [name] albuterol (PROVENTIL HFA;VENTOLIN HFA) 108 (90 BASE) MCG/ACT inhaler   Inhalation   Inhale 2 puffs into the lungs every 4 (four) hours as needed for wheezing.   1 Inhaler   3   . benzonatate (TESSALON) 100 MG capsule   Oral   Take 1 capsule (100 mg total) by mouth every 8 (eight) hours.   21 capsule  0   . mometasone (NASONEX) 50 MCG/ACT nasal spray   Nasal   Place 2 sprays into the nose daily.   17 g   12   . oxymetazoline (AFRIN NASAL SPRAY) 0.05 % nasal spray   Nasal   Place 2 sprays into the nose 2 (two) times daily.   30 mL   0   . predniSONE (DELTASONE) 20 MG tablet   Oral   Take 2 tablets (40 mg total) by mouth daily.   10 tablet   0    BP 107/73  Pulse 97  Temp(Src) 98.6 F (37 C) (Oral)  Resp 18  SpO2 100%  LMP 01/26/2013 Physical Exam  Nursing note and vitals reviewed. Constitutional: She is oriented to person, place, and time. She appears well-developed and well-nourished. No distress.  HENT:  Head: Normocephalic and atraumatic.  Mouth/Throat: Oropharynx is  clear and moist.  Patient with mild frontal sinus tenderness to palpation. Patient has bilateral swollen nasal turbinates. Patient has bilateral erythematous TMs. Posterior oropharynx is erythematous without evidence of exudates.  Eyes: EOM are normal. Pupils are equal, round, and reactive to light.  Neck: Normal range of motion. Neck supple.  No meningeal signs  Cardiovascular: Normal rate and regular rhythm.   Pulmonary/Chest: Effort normal. No respiratory distress. She has wheezes (mild diffuse expiratory wheezing throughout). She has no rales.  Abdominal: Soft. Bowel sounds are normal. She exhibits no distension and no mass. There is no tenderness. There is no rebound and no guarding.  Musculoskeletal: Normal range of motion. She exhibits no edema and no tenderness.  No calf swelling or tenderness.  Lymphadenopathy:    She has no cervical adenopathy.  Neurological: She is alert and oriented to person, place, and time.  Patient is alert and oriented x3 with clear, goal oriented speech. Patient has 5/5 motor in all extremities. Sensation is intact to light touch.Patient has a normal gait and walks without assistance.   Skin: Skin is warm and dry. No rash noted. No erythema.  Psychiatric: She has a normal mood and affect. Her behavior is normal.    ED Course  Procedures (including critical care time) Labs Review Labs Reviewed - No data to display Imaging Review Dg Chest 2 View  02/21/2013   *RADIOLOGY REPORT*  Clinical Data: Cough  CHEST - 2 VIEW  Comparison: Prior radiograph from 01/30/2013  Findings: Cardiac and mediastinal silhouettes are stable in size and contour, and remain within normal limits.  The lungs are normally inflated.  No airspace consolidation, pulmonary edema, or pleural effusion is identified.  There is no pneumothorax.  No acute osseous abnormality.  IMPRESSION: No acute cardiopulmonary process.   Original Report Authenticated By: Rise Mu, M.D.    MDM   Patient with likely viral with URI versus sinusitis with exacerbation of her underlying asthma. Chest x-ray with no evidence of pneumonia.  The patient's wheezing has improved significantly since the breathing treatment. She also states the cough is improved. We'll discharge home with a short course of steroids and antitussives. Return precautions have been given.  Loren Racer, MD 02/21/13 [phone]

## 2013-08-17 ENCOUNTER — Emergency Department (HOSPITAL_COMMUNITY)
Admission: EM | Admit: 2013-08-17 | Discharge: 2013-08-17 | Disposition: A | Discharge status: Home / Self Care | Payer: Medicaid Other | Attending: Emergency Medicine | Admitting: Emergency Medicine

## 2013-08-17 ENCOUNTER — Encounter (HOSPITAL_COMMUNITY): Payer: Self-pay | Admitting: Emergency Medicine

## 2013-08-17 DIAGNOSIS — M549 Dorsalgia, unspecified: Secondary | ICD-10-CM | POA: Insufficient documentation

## 2013-08-17 DIAGNOSIS — J45909 Unspecified asthma, uncomplicated: Secondary | ICD-10-CM | POA: Insufficient documentation

## 2013-08-17 DIAGNOSIS — Z79899 Other long term (current) drug therapy: Secondary | ICD-10-CM | POA: Insufficient documentation

## 2013-08-17 DIAGNOSIS — H9209 Otalgia, unspecified ear: Secondary | ICD-10-CM | POA: Insufficient documentation

## 2013-08-17 DIAGNOSIS — H9203 Otalgia, bilateral: Secondary | ICD-10-CM

## 2013-08-17 DIAGNOSIS — H7390 Unspecified disorder of tympanic membrane, unspecified ear: Secondary | ICD-10-CM | POA: Insufficient documentation

## 2013-08-17 DIAGNOSIS — R630 Anorexia: Secondary | ICD-10-CM | POA: Insufficient documentation

## 2013-08-17 DIAGNOSIS — Z91040 Latex allergy status: Secondary | ICD-10-CM | POA: Insufficient documentation

## 2013-08-17 DIAGNOSIS — J069 Acute upper respiratory infection, unspecified: Secondary | ICD-10-CM

## 2013-08-17 DIAGNOSIS — G8929 Other chronic pain: Secondary | ICD-10-CM | POA: Insufficient documentation

## 2013-08-17 DIAGNOSIS — F17200 Nicotine dependence, unspecified, uncomplicated: Secondary | ICD-10-CM | POA: Insufficient documentation

## 2013-08-17 MED ORDER — OXYMETAZOLINE HCL 0.05 % NA SOLN
1.0000 | Freq: Once | NASAL | Status: AC
  Administered 2013-08-17: 1 via NASAL
  Filled 2013-08-17: qty 15

## 2013-08-17 NOTE — ED Provider Notes (Signed)
CSN: [id number]     Arrival date & time 08/17/13  2127 History  This chart was scribed for non-physician practitioner Arman FilterGail K Davonte Siebenaler, NP, working with Nelia Shiobert L Beaton, MD, by Yevette EdwardsAngela Bracken, ED Scribe. This patient was seen in room WTR5/WTR5 and the patient's care was started at 11:03 PM. one    Chief Complaint  Patient presents with  . Nasal Congestion  . Otalgia    The history is provided by the patient. No language interpreter was used.   HPI Comments: Cassandra Harrison is a 23 y.o. female who presents to the Emergency Department complaining of three days of nasal congestion. The nasal congestion has been associated with sinus pressure, bilateral otalgia, and a suppressed appetite.  She has used Sudafed and [employer] Sel[employer] Cold without relief. She denies a fever.   Past Medical History  Diagnosis Date  . Chronic back pain   . Migraine   . Bulging disc   . Constipation   . Asthma     last used 11/29/11   Past Surgical History  Procedure Laterality Date  . No past surgeries     Family History  Problem Relation Age of Onset  . Diabetes Mother   . COPD Mother   . Heart disease Mother   . COPD Father   . Heart disease Father   . Cancer Sister   . Cancer Paternal Aunt   . Diabetes Maternal Grandmother   . Cancer Maternal Grandmother   . COPD Paternal Grandmother   . Heart disease Paternal Grandfather   . Other Neg Hx    History  Substance Use Topics  . Smoking status: Current Every Day Smoker -- 0.25 packs/day  . Smokeless tobacco: Not on file  . Alcohol Use: No   OB History   Grav Para Term Preterm Abortions TAB SAB Ect Mult Living   1 1 1  0 0 0 0 0 0 1     Review of Systems  Constitutional: Positive for appetite change. Negative for fever.  HENT: Positive for congestion, ear pain and sinus pressure.   Respiratory: Negative for shortness of breath.   Musculoskeletal: Negative for myalgias.  Neurological: Negative for headaches.  All other systems reviewed and are  negative.    Allergies  Olive oil and Latex  Home Medications   Current Outpatient Rx  Name  Route  Sig  Dispense  Refill  . DM-Phenylephrine-Acetaminophen (ALKA-SELTZER PLS SINUS & COUGH) 10-5-325 MG CAPS   Oral   Take 1 packet by mouth at bedtime as needed. For cough         . pseudoephedrine (SUDAFED) 30 MG tablet   Oral   Take 30 mg by mouth every 4 (four) hours as needed for congestion.         [name]. albuterol (PROVENTIL HFA;VENTOLIN HFA) 108 (90 BASE) MCG/ACT inhaler   Inhalation   Inhale 2 puffs into the lungs every 4 (four) hours as needed for wheezing.   1 Inhaler   3    Triage Vitals: BP 113/73  Pulse 74  Temp(Src) 97.6 F (36.4 C) (Oral)  Resp 18  SpO2 98%  LMP 08/10/2013  Physical Exam  Nursing note and vitals reviewed. Constitutional: She is oriented to person, place, and time. She appears well-developed and well-nourished. No distress.  HENT:  Head: Normocephalic and atraumatic.  Right Ear: Tympanic membrane is bulging.  Left Ear: Tympanic membrane is bulging.  Mouth/Throat: Oropharynx is clear and moist.  Slight bulging of TM without erythema  of canal,  Eyes: EOM are normal.  Neck: Neck supple. No tracheal deviation present.  Cardiovascular: Normal rate and regular rhythm.   Pulmonary/Chest: Effort normal and breath sounds normal. No respiratory distress. She has no wheezes.  Musculoskeletal: Normal range of motion.  Lymphadenopathy:    She has no cervical adenopathy.  Neurological: She is alert and oriented to person, place, and time.  Skin: Skin is warm and dry.  Psychiatric: She has a normal mood and affect. Her behavior is normal.    ED Course  Procedures (including critical care time) DIAGNOSTIC STUDIES: Oxygen Saturation is 98% on room air, normal by my interpretation.    COORDINATION OF CARE:  11:06 PM- Discussed treatment plan with patient, and the patient agreed to the plan.   Labs Review Labs Reviewed - No data to  display Imaging Review No results found.   EKG Interpretation None      MDM   Final diagnoses:  URI (upper respiratory infection)  Otalgia of both ears       I personally performed the services described in this document, which was scribed in my presence. The recorded information has been reviewed and is accurate.     Arman Filter, NP 08/17/13 2320

## 2013-08-17 NOTE — ED Notes (Signed)
Pt complains of head pressure, ear pain and congestion for about three days, nothing OTC is giving her relief

## 2013-08-17 NOTE — Discharge Instructions (Signed)
Use the supplied Afrin minutes, twice a day, 1 spray to each nostril for 3, days.  Only continue taking Sudafed as a decongestant

## 2013-08-18 NOTE — ED Provider Notes (Signed)
Medical screening examination/treatment/procedure(s) were performed by non-physician practitioner and as supervising physician I was immediately available for consultation/collaboration.   Sarvesh Meddaugh L Adonay Scheier, MD 08/18/13 1248 

## 2014-03-09 ENCOUNTER — Encounter (HOSPITAL_COMMUNITY): Payer: Self-pay | Admitting: Emergency Medicine

## 2014-03-09 ENCOUNTER — Emergency Department (HOSPITAL_COMMUNITY): Payer: Medicaid Other

## 2014-03-09 ENCOUNTER — Emergency Department (HOSPITAL_COMMUNITY)
Admission: EM | Admit: 2014-03-09 | Discharge: 2014-03-09 | Disposition: A | Discharge status: Home / Self Care | Payer: Medicaid Other | Attending: Emergency Medicine | Admitting: Emergency Medicine

## 2014-03-09 DIAGNOSIS — J45901 Unspecified asthma with (acute) exacerbation: Secondary | ICD-10-CM | POA: Diagnosis not present

## 2014-03-09 DIAGNOSIS — Z79899 Other long term (current) drug therapy: Secondary | ICD-10-CM | POA: Diagnosis not present

## 2014-03-09 DIAGNOSIS — F17200 Nicotine dependence, unspecified, uncomplicated: Secondary | ICD-10-CM | POA: Diagnosis not present

## 2014-03-09 DIAGNOSIS — G8929 Other chronic pain: Secondary | ICD-10-CM | POA: Insufficient documentation

## 2014-03-09 DIAGNOSIS — Q759 Congenital malformation of skull and face bones, unspecified: Secondary | ICD-10-CM | POA: Insufficient documentation

## 2014-03-09 DIAGNOSIS — R059 Cough, unspecified: Secondary | ICD-10-CM | POA: Insufficient documentation

## 2014-03-09 DIAGNOSIS — Z91040 Latex allergy status: Secondary | ICD-10-CM | POA: Insufficient documentation

## 2014-03-09 DIAGNOSIS — Z8719 Personal history of other diseases of the digestive system: Secondary | ICD-10-CM | POA: Insufficient documentation

## 2014-03-09 DIAGNOSIS — J209 Acute bronchitis, unspecified: Secondary | ICD-10-CM

## 2014-03-09 DIAGNOSIS — R05 Cough: Secondary | ICD-10-CM | POA: Diagnosis present

## 2014-03-09 DIAGNOSIS — Z8679 Personal history of other diseases of the circulatory system: Secondary | ICD-10-CM | POA: Insufficient documentation

## 2014-03-09 MED ORDER — BENZONATATE 100 MG PO CAPS: 100.0000 mg | ORAL_CAPSULE | Freq: Three times a day (TID) | ORAL | Status: DC

## 2014-03-09 MED ORDER — PREDNISONE 20 MG PO TABS
40.0000 mg | ORAL_TABLET | Freq: Once | ORAL | Status: AC
  Administered 2014-03-09: 40 mg via ORAL
  Filled 2014-03-09: qty 2

## 2014-03-09 MED ORDER — ALBUTEROL SULFATE HFA 108 (90 BASE) MCG/ACT IN AERS: 2.0000 | INHALATION_SPRAY | RESPIRATORY_TRACT | Status: DC | PRN

## 2014-03-09 MED ORDER — ALBUTEROL SULFATE (2.5 MG/3ML) 0.083% IN NEBU
5.0000 mg | INHALATION_SOLUTION | Freq: Once | RESPIRATORY_TRACT | Status: AC
  Administered 2014-03-09: 5 mg via RESPIRATORY_TRACT
  Filled 2014-03-09: qty 6

## 2014-03-09 MED ORDER — PREDNISONE 20 MG PO TABS: 40.0000 mg | ORAL_TABLET | Freq: Every day | ORAL | Status: DC

## 2014-03-09 MED ORDER — NAPROXEN 500 MG PO TABS: 500.0000 mg | ORAL_TABLET | Freq: Two times a day (BID) | ORAL | Status: DC

## 2014-03-09 NOTE — ED Provider Notes (Signed)
CSN: [id number]     Arrival date & time 03/09/14  1736 History   First MD Initiated Contact with Patient 03/09/14 1849     Chief Complaint  Patient presents with  . Chest Pain  . Cough     (Consider location/radiation/quality/duration/timing/severity/associated sxs/prior Treatment) HPI Comments: 23 year old female, history of asthma and headaches who presents with a complaint of 3 days of coughing, sore throat, nasal congestion. The symptoms are persistent, nothing makes it better or worse, she has been out of her albuterol medications for many months. She has been trying over-the-counter medications including Sudafed and Alka-Seltzer plus without relief. She denies fevers chills nausea vomiting diarrhea or swelling of the legs. She has no known sick contacts and has had no travel. She has maintained a normal appetite  The history is provided by the patient.    Past Medical History  Diagnosis Date  . Chronic back pain   . Migraine   . Bulging disc   . Constipation   . Asthma     last used 11/29/11   Past Surgical History  Procedure Laterality Date  . No past surgeries     Family History  Problem Relation Age of Onset  . Diabetes Mother   . COPD Mother   . Heart disease Mother   . COPD Father   . Heart disease Father   . Cancer Sister   . Cancer Paternal Aunt   . Diabetes Maternal Grandmother   . Cancer Maternal Grandmother   . COPD Paternal Grandmother   . Heart disease Paternal Grandfather   . Other Neg Hx    History  Substance Use Topics  . Smoking status: Current Every Day Smoker -- 0.25 packs/day  . Smokeless tobacco: Not on file  . Alcohol Use: No   OB History   Grav Para Term Preterm Abortions TAB SAB Ect Mult Living   1 1 1  0 0 0 0 0 0 1     Review of Systems  All other systems reviewed and are negative.     Allergies  Olive oil and Latex  Home Medications   Prior to Admission medications   Medication Sig Start Date End Date Taking? Authorizing  Provider  albuterol (PROVENTIL HFA;VENTOLIN HFA) 108 (90 BASE) MCG/ACT inhaler Inhale 2 puffs into the lungs every 4 (four) hours as needed for wheezing. 05/25/12   Suzi Roots, MD  albuterol (PROVENTIL HFA;VENTOLIN HFA) 108 (90 BASE) MCG/ACT inhaler Inhale 2 puffs into the lungs every 4 (four) hours as needed for wheezing or shortness of breath. 03/09/14   Vida Roller, MD  benzonatate (TESSALON) 100 MG capsule Take 1 capsule (100 mg total) by mouth every 8 (eight) hours. 03/09/14   Vida Roller, MD  DM-Phenylephrine-Acetaminophen (ALKA-SELTZER PLS SINUS & COUGH) 10-5-325 MG CAPS Take 1 packet by mouth at bedtime as needed. For cough    Historical Provider, MD  naproxen (NAPROSYN) 500 MG tablet Take 1 tablet (500 mg total) by mouth 2 (two) times daily with a meal. 03/09/14   Vida Roller, MD  predniSONE (DELTASONE) 20 MG tablet Take 2 tablets (40 mg total) by mouth daily. 03/09/14   Vida Roller, MD  pseudoephedrine (SUDAFED) 30 MG tablet Take 30 mg by mouth every 4 (four) hours as needed for congestion.    Historical Provider, MD   BP 107/64  Pulse 74  Temp(Src) 98.2 F (36.8 C) (Oral)  Resp 20  SpO2 99%  LMP 02/09/2014 Physical Exam  Nursing  note and vitals reviewed. Constitutional: She appears well-developed and well-nourished. No distress.  HENT:  Head: Normocephalic and atraumatic.  Mouth/Throat: Oropharynx is clear and moist. No oropharyngeal exudate.  Oropharynx is clear and moist, nasal passages with swelling of the nasal turbinates and mild rhinorrhea, no sinus tenderness  Eyes: Conjunctivae and EOM are normal. Pupils are equal, round, and reactive to light. Right eye exhibits no discharge. Left eye exhibits no discharge. No scleral icterus.  Neck: Normal range of motion. Neck supple. No JVD present. No thyromegaly present.  Cardiovascular: Normal rate, regular rhythm, normal heart sounds and intact distal pulses.  Exam reveals no gallop and no friction rub.   No murmur  heard. Pulmonary/Chest: Effort normal. No respiratory distress. She has wheezes (mild expiratory wheezing, no distress or increased work of breathing, speaks in full sentences). She has no rales.  Abdominal: Soft. Bowel sounds are normal. She exhibits no distension and no mass. There is no tenderness.  Musculoskeletal: Normal range of motion. She exhibits no edema and no tenderness.  Lymphadenopathy:    She has no cervical adenopathy.  Neurological: She is alert. Coordination normal.  Skin: Skin is warm and dry. No rash noted. No erythema.  Psychiatric: She has a normal mood and affect. Her behavior is normal.    ED Course  Procedures (including critical care time) Labs Review Labs Reviewed  POC URINE PREG, ED    Imaging Review Dg Chest 2 View  03/09/2014   CLINICAL DATA:  Chronic back pain with cough for 2 days.  EXAM: CHEST  2 VIEW  COMPARISON:  02/21/2013 radiographs.  FINDINGS: The heart size and mediastinal contours are stable. The lungs appear stable with mild central airway thickening, likely related to smoking. There is no hyperinflation or confluent airspace opacity. There is no pleural effusion. The osseous structures appear unchanged.  IMPRESSION: No acute cardiopulmonary process. Mild chronic central airway thickening, likely related to smoking.   Electronically Signed   By: Roxy Horseman M.D.   On: 03/09/2014 18:31    ED ECG REPORT  I personally interpreted this EKG   Date: 03/09/2014   Rate: 104  Rhythm: sinus tachycardia  QRS Axis: normal  Intervals: normal  ST/T Wave abnormalities: normal  Conduction Disutrbances:none  Narrative Interpretation:   Old EKG Reviewed: none available   MDM   Final diagnoses:  Acute bronchitis, unspecified organism    The patient is wheezing and coughing consistent with asthma exacerbation or acute bronchitis. In the presence of the persistent coughing or upper respiratory symptoms this is likely related to infection, chest x-ray  shows no signs of pneumonia, vital signs reassuring, patient will be given albuterol nebulizer with prescriptions for prednisone and albuterol MDI with a cough suppressant and an anti-inflammatory for her mild headache. She is not in distress and is amenable for discharge with this plan.  Meds given in ED:  Medications  albuterol (PROVENTIL) (2.5 MG/3ML) 0.083% nebulizer solution 5 mg (not administered)  predniSONE (DELTASONE) tablet 40 mg (not administered)    New Prescriptions   ALBUTEROL (PROVENTIL HFA;VENTOLIN HFA) 108 (90 BASE) MCG/ACT INHALER    Inhale 2 puffs into the lungs every 4 (four) hours as needed for wheezing or shortness of breath.   BENZONATATE (TESSALON) 100 MG CAPSULE    Take 1 capsule (100 mg total) by mouth every 8 (eight) hours.   NAPROXEN (NAPROSYN) 500 MG TABLET    Take 1 tablet (500 mg total) by mouth 2 (two) times daily with a meal.  PREDNISONE (DELTASONE) 20 MG TABLET    Take 2 tablets (40 mg total) by mouth daily.        Vida RollerBrian D Kimimila Tauzin, MD 03/09/14 [phone]

## 2014-03-09 NOTE — ED Notes (Signed)
Pt discharged to home; states friend will pick her up. NAD noted

## 2014-03-09 NOTE — ED Notes (Signed)
Pt c/o generalized CP into back worse with cough x 3 days

## 2014-03-09 NOTE — ED Notes (Signed)
Pt presents to ED with SOB; hx of asthma. States that she has not been to a PCP since moving here and has not had prescriptions for inhalers renewed. Pt appears anxious.

## 2014-03-09 NOTE — Discharge Instructions (Signed)
°Emergency Department Resource Guide °1) Find a Doctor and Pay Out of Pocket °Although you won't have to find out who is covered by your insurance plan, it is a good idea to ask around and get recommendations. You will then need to call the office and see if the doctor you have chosen will accept you as a new patient and what types of options they offer for patients who are self-pay. Some doctors offer discounts or will set up payment plans for their patients who do not have insurance, but you will need to ask so you aren't surprised when you get to your appointment. ° °2) Contact Your Local Health Department °Not all health departments have doctors that can see patients for sick visits, but many do, so it is worth a call to see if yours does. If you don't know where your local health department is, you can check in your phone book. The CDC also has a tool to help you locate your state's health department, and many state websites also have listings of all of their local health departments. ° °3) Find a Walk-in Clinic °If your illness is not likely to be very severe or complicated, you may want to try a walk in clinic. These are popping up all over the country in pharmacies, drugstores, and shopping centers. They're usually staffed by nurse practitioners or physician assistants that have been trained to treat common illnesses and complaints. They're usually fairly quick and inexpensive. However, if you have serious medical issues or chronic medical problems, these are probably not your best option. ° °No Primary Care Doctor: °- Call [facility] at  [phone] - they can help you locate a primary care doctor that  accepts your insurance, provides certain services, etc. °- Physician Referral Service- 1-800-533-3463 ° °Chronic Pain Problems: °Organization         Address  Phone   Notes  °[facility]  [phone] Patients need to be referred by their primary care doctor.  ° °Medication  Assistance: °Organization         Address  Phone   Notes  °[place] [facility] [address] °[place], [place] [address] [phone]) [phone] --Must be a resident of [place] °-- Must have NO insurance coverage whatsoever (no Medicaid/ Medicare, etc.) °-- The pt. MUST have a primary care doctor that directs their care regularly and follows them in the community °  °[facility]  (866) 331-1348   °[employer]  (888) 892-1162   ° °Agencies that provide inexpensive medical care: °Organization         Address  Phone   Notes  °[facility]  ([phone]) [phone]   °[facility]    ([phone]) [phone]   °[facility] [address] °[place], [place] [address] [phone]) [phone]   °[facility] [address], °[place] ([phone]) [phone]   °[facility]    ([phone]) [phone]   °[facility]    ([phone]) [phone]   °[facility] ° [address], [place] Phone:  ([phone]) [phone], Fax:  ([phone] Hours of Operation:  9 am - 6 pm, M-F.  Also accepts Medicaid/Medicare and self-pay.  °[facility] ° [address],  Phone: ([phone]) [phone], Fax: [phone]. Hours of Operation:  8:30 am - 5:30 pm, M-F.  Also accepts Medicaid and self-pay.  °[facility] [place] [address], [place] Phone: ([phone]) [phone]   °[facility] [address], [place], [place] (336)[phone], Ext. 123 Mondays & Thursdays: 7-9 AM.  First 15 patients are seen on a first come, first serve basis. °  ° °Medicaid-accepting [place] Providers: ° °Organization         Address  Phone   Notes  °[facility] [address], [place] [phone]) [phone] Also accepts self-pay patients.  °[facility] [address], [place] ° ([phone]) [phone]   °[facility] [address], [place]  ([phone]) [phone]   °[facility] [address] [place] [address], [place] ([phone]) [phone]   °[facility] [address], [place]  ° ([phone]) [phone] Only accepts [place] Access Medicaid patients after they have their name applied to their card.  ° °Self-Pay (no insurance) in [place]: ° °Organization         Address  Phone   Notes  °Sickle Cell Patients, [facility] [address], [place] ([phone]) [phone]   °[facility] [address], [place] ([phone]) [phone]   °[facility] ° [address], [place] [phone]) [phone]   °[facility]/Dr. Osei-Bonsu ° [address] [place] [address], [place] or [address], [place] [phone]) [phone] Phone number for both [place] and [place] locations is the same.  °[facility] [address], [place] [phone]) [phone]   °[facility] [address] [place] [address], [place] or [address] ([phone]) [phone] °([phone]) [phone]   °[facility] [address], [place] ([phone]) [phone], phone; ([phone]) [phone], fax Sees patients 1st and 3rd Saturday of every month.  Must not qualify for public or private insurance (i.e. Medicaid, Medicare, [place] Health Choice, Veterans' Benefits) • Household income should be no more than 200% of the poverty level •The clinic cannot treat you if you are pregnant or think you are pregnant • Sexually transmitted diseases are not treated at the clinic.  ° °

## 2014-04-12 ENCOUNTER — Encounter (HOSPITAL_COMMUNITY): Payer: Self-pay | Admitting: Emergency Medicine

## 2015-03-07 ENCOUNTER — Emergency Department (HOSPITAL_COMMUNITY)
Admission: EM | Admit: 2015-03-07 | Discharge: 2015-03-07 | Disposition: A | Discharge status: Home / Self Care | Payer: Medicaid Other | Attending: Emergency Medicine | Admitting: Emergency Medicine

## 2015-03-07 ENCOUNTER — Encounter (HOSPITAL_COMMUNITY): Payer: Self-pay | Admitting: Cardiology

## 2015-03-07 DIAGNOSIS — Z8679 Personal history of other diseases of the circulatory system: Secondary | ICD-10-CM | POA: Insufficient documentation

## 2015-03-07 DIAGNOSIS — Z8719 Personal history of other diseases of the digestive system: Secondary | ICD-10-CM | POA: Insufficient documentation

## 2015-03-07 DIAGNOSIS — Z91040 Latex allergy status: Secondary | ICD-10-CM | POA: Insufficient documentation

## 2015-03-07 DIAGNOSIS — Z79899 Other long term (current) drug therapy: Secondary | ICD-10-CM | POA: Insufficient documentation

## 2015-03-07 DIAGNOSIS — H9202 Otalgia, left ear: Secondary | ICD-10-CM | POA: Diagnosis not present

## 2015-03-07 DIAGNOSIS — J9801 Acute bronchospasm: Secondary | ICD-10-CM | POA: Diagnosis not present

## 2015-03-07 DIAGNOSIS — G8929 Other chronic pain: Secondary | ICD-10-CM | POA: Diagnosis not present

## 2015-03-07 DIAGNOSIS — Z720 Tobacco use: Secondary | ICD-10-CM | POA: Diagnosis not present

## 2015-03-07 DIAGNOSIS — R0602 Shortness of breath: Secondary | ICD-10-CM | POA: Diagnosis present

## 2015-03-07 MED ORDER — ACETAMINOPHEN 325 MG PO TABS
650.0000 mg | ORAL_TABLET | Freq: Once | ORAL | Status: AC
  Administered 2015-03-07: 650 mg via ORAL
  Filled 2015-03-07: qty 2

## 2015-03-07 MED ORDER — PREDNISONE 20 MG PO TABS: ORAL_TABLET | ORAL | Status: DC

## 2015-03-07 MED ORDER — NAPROXEN 500 MG PO TABS: 500.0000 mg | ORAL_TABLET | Freq: Two times a day (BID) | ORAL | Status: DC

## 2015-03-07 MED ORDER — ALBUTEROL SULFATE (2.5 MG/3ML) 0.083% IN NEBU
5.0000 mg | INHALATION_SOLUTION | Freq: Once | RESPIRATORY_TRACT | Status: AC
  Administered 2015-03-07: 5 mg via RESPIRATORY_TRACT
  Filled 2015-03-07: qty 6

## 2015-03-07 MED ORDER — IPRATROPIUM BROMIDE 0.02 % IN SOLN
0.5000 mg | Freq: Once | RESPIRATORY_TRACT | Status: AC
  Administered 2015-03-07: 0.5 mg via RESPIRATORY_TRACT
  Filled 2015-03-07: qty 2.5

## 2015-03-07 MED ORDER — ALBUTEROL SULFATE HFA 108 (90 BASE) MCG/ACT IN AERS
2.0000 | INHALATION_SPRAY | Freq: Once | RESPIRATORY_TRACT | Status: AC
  Administered 2015-03-07: 2 via RESPIRATORY_TRACT
  Filled 2015-03-07: qty 6.7

## 2015-03-07 MED ORDER — PREDNISONE 20 MG PO TABS
60.0000 mg | ORAL_TABLET | Freq: Once | ORAL | Status: AC
  Administered 2015-03-07: 60 mg via ORAL
  Filled 2015-03-07: qty 3

## 2015-03-07 NOTE — ED Notes (Signed)
Pt reports cough, nasal/chest congestion, and SOb over the past couple days. Reports taking OTC medication without much relief.

## 2015-03-07 NOTE — ED Provider Notes (Signed)
CSN: [id number]     Arrival date & time 03/07/15  1430 History  By signing my name below, I, Soijett Blue, attest that this documentation has been prepared under the direction and in the presence of Celene Skeen, PA-C Electronically Signed: Soijett Blue, ED Scribe. 03/07/2015. 4:24 PM.  Chief Complaint  Patient presents with  . Cough  . Nasal Congestion  . Shortness of Breath  . Otalgia      The history is provided by the patient. No language interpreter was used.    Cassandra Harrison is a 24 y.o. female with a hx of bronchitis and asthma, who presents to the Emergency Department complaining of progressing dry cough onset 4 days ago . She denies having an inhaler at this point. She reports that it has been awhile since she last had an asthma exertion and she notes that it is worsened with weather change. She states that she is having associated symptoms of sore throat, nasal congestion, SOB, ear pain, and chest tightness. She states that she has tried OTC medications with no relief for her symptoms. She denies fever, chills, color change, rash, wound, and any other symptoms. She reports that she does have sick contacts.   Past Medical History  Diagnosis Date  . Chronic back pain   . Migraine   . Bulging disc   . Constipation   . Asthma     last used 11/29/11   Past Surgical History  Procedure Laterality Date  . No past surgeries     Family History  Problem Relation Age of Onset  . Diabetes Mother   . COPD Mother   . Heart disease Mother   . COPD Father   . Heart disease Father   . Cancer Sister   . Cancer Paternal Aunt   . Diabetes Maternal Grandmother   . Cancer Maternal Grandmother   . COPD Paternal Grandmother   . Heart disease Paternal Grandfather   . Other Neg Hx    Social History  Substance Use Topics  . Smoking status: Current Every Day Smoker -- 0.25 packs/day  . Smokeless tobacco: None  . Alcohol Use: No   OB History    Gravida Para Term Preterm AB TAB SAB  Ectopic Multiple Living   0 0 0 0 0 0 1     Review of Systems  Constitutional: Positive for fatigue. Negative for fever and chills.  HENT: Positive for congestion, ear pain (left) and sore throat.   Respiratory: Positive for cough, chest tightness and shortness of breath.   Gastrointestinal: Negative for nausea, vomiting, abdominal pain and diarrhea.  Skin: Negative for color change, pallor and wound.  All other systems reviewed and are negative.   Allergies  Olive oil and Latex  Home Medications   Prior to Admission medications   Medication Sig Start Date End Date Taking? Authorizing Provider  albuterol (PROVENTIL HFA;VENTOLIN HFA) 108 (90 BASE) MCG/ACT inhaler Inhale 2 puffs into the lungs every 4 (four) hours as needed for wheezing. 05/25/12   Cathren Laine, MD  albuterol (PROVENTIL HFA;VENTOLIN HFA) 108 (90 BASE) MCG/ACT inhaler Inhale 2 puffs into the lungs every 4 (four) hours as needed for wheezing or shortness of breath. 03/09/14   Eber Hong, MD  benzonatate (TESSALON) 100 MG capsule Take 1 capsule (100 mg total) by mouth every 8 (eight) hours. 03/09/14   Eber Hong, MD  DM-Phenylephrine-Acetaminophen (ALKA-SELTZER PLS SINUS & COUGH) 10-5-325 MG CAPS Take 1 packet by mouth at bedtime  as needed. For cough    Historical Provider, MD  predniSONE (DELTASONE) 20 MG tablet 2 tabs po daily x 4 days 03/07/15   Kathrynn Speed, PA-C  pseudoephedrine (SUDAFED) 30 MG tablet Take 30 mg by mouth every 4 (four) hours as needed for congestion.    Historical Provider, MD   BP 111/69 mmHg  Pulse 96  Temp(Src) 98.2 F (36.8 C) (Oral)  Resp 14  Ht  (1.575 m)  Wt 220 lb (99.791 kg)  BMI 40.23 kg/m2  SpO2 96%  LMP 02/10/2015 Physical Exam  Constitutional: She is oriented to person, place, and time. She appears well-developed and well-nourished. No distress.  HENT:  Head: Normocephalic and atraumatic.  Right Ear: Tympanic membrane normal. No mastoid tenderness. Tympanic membrane  is not injected and not erythematous.  Left Ear: No mastoid tenderness. Tympanic membrane is injected. Tympanic membrane is not erythematous.  Nose: Mucosal edema present. Right sinus exhibits no maxillary sinus tenderness and no frontal sinus tenderness. Left sinus exhibits no maxillary sinus tenderness and no frontal sinus tenderness.  Mouth/Throat: Uvula is midline, oropharynx is clear and moist and mucous membranes are normal. No posterior oropharyngeal edema, posterior oropharyngeal erythema or tonsillar abscesses.  Post nasal drip.  Eyes: Conjunctivae and EOM are normal.  Neck: Normal range of motion. Neck supple.  Cardiovascular: Normal rate, regular rhythm and normal heart sounds.  Exam reveals no gallop and no friction rub.   No murmur heard. Pulmonary/Chest: Effort normal. No respiratory distress. She has wheezes. She has no rales.  Expiratory wheezes mid-lower lung fields bilateral. Poor air movement.  Abdominal: Soft.  Musculoskeletal: Normal range of motion.  Lymphadenopathy:    She has no cervical adenopathy.  Neurological: She is alert and oriented to person, place, and time.  Skin: Skin is warm and dry.  Psychiatric: She has a normal mood and affect. Her behavior is normal.  Nursing note and vitals reviewed.   ED Course  Procedures (including critical care time) DIAGNOSTIC STUDIES: Oxygen Saturation is 96% on RA, nl by my interpretation.    COORDINATION OF CARE: 4:24 PM Discussed treatment plan with pt at bedside which includes breathing treatment and pt agreed to plan.    Labs Review Labs Reviewed - No data to display  Imaging Review No results found. I have personally reviewed and evaluated these images and lab results as part of my medical decision-making.   EKG Interpretation None      MDM   Final diagnoses:  Bronchospasm  Left ear pain   Nontoxic appearing, NAD. Afebrile. VSS. Poor air movement and bilateral wheezes on initial exam. Given DuoNeb  with significant improvement of air movement. Still has some wheezing. No fevers. Low suspicion for pneumonia. I do not feel chest x-ray is warranted at this time. Will start patient on a short course of prednisone and given albuterol inhaler. For ear pain, advised decongestants and over-the-counter pain relievers. Resources given for PCP follow-up. Stable for discharge. Return precautions given. Patient states understanding of treatment care plan and is agreeable.  I personally performed the services described in this documentation, which was scribed in my presence. The recorded information has been reviewed and is accurate.  Kathrynn Speed, PA-C 03/07/15 1744  Donnetta Hutching, MD 03/08/15 [phone]

## 2015-03-07 NOTE — Discharge Instructions (Signed)
Take prednisone as prescribed beginning tomorrow as you were given the first dose in the emergency department today. Take over-the-counter decongestions such as Sudafed or Mucinex. Use albuterol inhaler every 4-6 hours as needed for cough and wheezing. You may take over-the-counter medications such as ibuprofen, Tylenol or naproxen for pain.  Bronchospasm A bronchospasm is a spasm or tightening of the airways going into the lungs. During a bronchospasm breathing becomes more difficult because the airways get smaller. When this happens there can be coughing, a whistling sound when breathing (wheezing), and difficulty breathing. Bronchospasm is often associated with asthma, but not all patients who experience a bronchospasm have asthma. CAUSES  A bronchospasm is caused by inflammation or irritation of the airways. The inflammation or irritation may be triggered by:   Allergies (such as to animals, pollen, food, or mold). Allergens that cause bronchospasm may cause wheezing immediately after exposure or many hours later.   Infection. Viral infections are believed to be the most common cause of bronchospasm.   Exercise.   Irritants (such as pollution, cigarette smoke, strong odors, aerosol sprays, and paint fumes).   Weather changes. Winds increase molds and pollens in the air. Rain refreshes the air by washing irritants out. Cold air may cause inflammation.   Stress and emotional upset.  SIGNS AND SYMPTOMS   Wheezing.   Excessive nighttime coughing.   Frequent or severe coughing with a simple cold.   Chest tightness.   Shortness of breath.  DIAGNOSIS  Bronchospasm is usually diagnosed through a history and physical exam. Tests, such as chest X-rays, are sometimes done to look for other conditions. TREATMENT   Inhaled medicines can be given to open up your airways and help you breathe. The medicines can be given using either an inhaler or a nebulizer machine.  Corticosteroid  medicines may be given for severe bronchospasm, usually when it is associated with asthma. HOME CARE INSTRUCTIONS   Always have a plan prepared for seeking medical care. Know when to call your health care provider and local emergency services (911 in the [place] Know where you can access local emergency care.  Only take medicines as directed by your health care provider.  If you were prescribed an inhaler or nebulizer machine, ask your health care provider to explain how to use it correctly. Always use a spacer with your inhaler if you were given one.  It is necessary to remain calm during an attack. Try to relax and breathe more slowly.  Control your home environment in the following ways:   Change your heating and air conditioning filter at least once a month.   Limit your use of fireplaces and wood stoves.  Do not smoke and do not allow smoking in your home.   Avoid exposure to perfumes and fragrances.   Get rid of pests (such as roaches and mice) and their droppings.   Throw away plants if you see mold on them.   Keep your house clean and dust free.   Replace carpet with wood, tile, or vinyl flooring. Carpet can trap dander and dust.   Use allergy-proof pillows, mattress covers, and box spring covers.   Wash bed sheets and blankets every week in hot water and dry them in a dryer.   Use blankets that are made of polyester or cotton.   Wash hands frequently. SEEK MEDICAL CARE IF:   You have muscle aches.   You have chest pain.   The sputum changes from clear or white to yellow,  green, gray, or bloody.   The sputum you cough up gets thicker.   There are problems that may be related to the medicine you are given, such as a rash, itching, swelling, or trouble breathing.  SEEK IMMEDIATE MEDICAL CARE IF:   You have worsening wheezing and coughing even after taking your prescribed medicines.   You have increased difficulty breathing.   You develop  severe chest pain. MAKE SURE YOU:   Understand these instructions.  Will watch your condition.  Will get help right away if you are not doing well or get worse. Document Released: 05/31/2003 Document Revised: 06/02/2013 Document Reviewed: 11/17/2012 [employer] [facility] Patient Information 2015 [place], [place]. This information is not intended to replace advice given to you by your health care provider. Make sure you discuss any questions you have with your health care provider.  Otalgia The most common reason for this in children is an infection of the middle ear. Pain from the middle ear is usually caused by a build-up of fluid and pressure behind the eardrum. Pain from an earache can be sharp, dull, or burning. The pain may be temporary or constant. The middle ear is connected to the nasal passages by a short narrow tube called the Eustachian tube. The Eustachian tube allows fluid to drain out of the middle ear, and helps keep the pressure in your ear equalized. CAUSES  A cold or allergy can block the Eustachian tube with inflammation and the build-up of secretions. This is especially likely in small children, because their Eustachian tube is shorter and more horizontal. When the Eustachian tube closes, the normal flow of fluid from the middle ear is stopped. Fluid can accumulate and cause stuffiness, pain, hearing loss, and an ear infection if germs start growing in this area. SYMPTOMS  The symptoms of an ear infection may include fever, ear pain, fussiness, increased crying, and irritability. Many children will have temporary and minor hearing loss during and right after an ear infection. Permanent hearing loss is rare, but the risk increases the more infections a child has. Other causes of ear pain include retained water in the outer ear canal from swimming and bathing. Ear pain in adults is less likely to be from an ear infection. Ear pain may be referred from other locations. Referred pain may be from  the joint between your jaw and the skull. It may also come from a tooth problem or problems in the neck. Other causes of ear pain include:  A foreign body in the ear.  Outer ear infection.  Sinus infections.  Impacted ear wax.  Ear injury.  Arthritis of the jaw or TMJ problems.  Middle ear infection.  Tooth infections.  Sore throat with pain to the ears. DIAGNOSIS  Your caregiver can usually make the diagnosis by examining you. Sometimes other special studies, including x-rays and lab work may be necessary. TREATMENT   If antibiotics were prescribed, use them as directed and finish them even if you or your child's symptoms seem to be improved.  Sometimes PE tubes are needed in children. These are little plastic tubes which are put into the eardrum during a simple surgical procedure. They allow fluid to drain easier and allow the pressure in the middle ear to equalize. This helps relieve the ear pain caused by pressure changes. HOME CARE INSTRUCTIONS   Only take over-the-counter or prescription medicines for pain, discomfort, or fever as directed by your caregiver. DO NOT GIVE CHILDREN ASPIRIN because of the association of Reye's  Syndrome in children taking aspirin.  Use a cold pack applied to the outer ear for 15-20 minutes, 03-04 times per day or as needed may reduce pain. Do not apply ice directly to the skin. You may cause frost bite.  Over-the-counter ear drops used as directed may be effective. Your caregiver may sometimes prescribe ear drops.  Resting in an upright position may help reduce pressure in the middle ear and relieve pain.  Ear pain caused by rapidly descending from high altitudes can be relieved by swallowing or chewing gum. Allowing infants to suck on a bottle during airplane travel can help.  Do not smoke in the house or near children. If you are unable to quit smoking, smoke outside.  Control allergies. SEEK IMMEDIATE MEDICAL CARE IF:   You or your  child are becoming sicker.  Pain or fever relief is not obtained with medicine.  You or your child's symptoms (pain, fever, or irritability) do not improve within 24 to 48 hours or as instructed.  Severe pain suddenly stops hurting. This may indicate a ruptured eardrum.  You or your children develop new problems such as severe headaches, stiff neck, difficulty swallowing, or swelling of the face or around the ear. Document Released: 01/13/2004 Document Revised: 08/20/2011 Document Reviewed: 05/19/2008 [facility] Patient Information 2015 [place], [place]. This information is not intended to replace advice given to you by your health care provider. Make sure you discuss any questions you have with your health care provider.

## 2015-03-07 NOTE — ED Notes (Signed)
Declined W/C at D/C and was escorted to lobby by RN. 

## 2015-07-01 ENCOUNTER — Emergency Department (HOSPITAL_COMMUNITY)
Admission: EM | Admit: 2015-07-01 | Discharge: 2015-07-02 | Disposition: A | Discharge status: Home / Self Care | Payer: Medicaid Other | Attending: Emergency Medicine | Admitting: Emergency Medicine

## 2015-07-01 ENCOUNTER — Encounter (HOSPITAL_COMMUNITY): Payer: Self-pay | Admitting: *Deleted

## 2015-07-01 DIAGNOSIS — Z8679 Personal history of other diseases of the circulatory system: Secondary | ICD-10-CM | POA: Diagnosis not present

## 2015-07-01 DIAGNOSIS — G8929 Other chronic pain: Secondary | ICD-10-CM | POA: Insufficient documentation

## 2015-07-01 DIAGNOSIS — J029 Acute pharyngitis, unspecified: Secondary | ICD-10-CM | POA: Diagnosis not present

## 2015-07-01 DIAGNOSIS — Z79899 Other long term (current) drug therapy: Secondary | ICD-10-CM | POA: Diagnosis not present

## 2015-07-01 DIAGNOSIS — J45909 Unspecified asthma, uncomplicated: Secondary | ICD-10-CM | POA: Diagnosis not present

## 2015-07-01 DIAGNOSIS — Z91040 Latex allergy status: Secondary | ICD-10-CM | POA: Insufficient documentation

## 2015-07-01 DIAGNOSIS — Z8739 Personal history of other diseases of the musculoskeletal system and connective tissue: Secondary | ICD-10-CM | POA: Diagnosis not present

## 2015-07-01 DIAGNOSIS — F17200 Nicotine dependence, unspecified, uncomplicated: Secondary | ICD-10-CM | POA: Diagnosis not present

## 2015-07-01 LAB — RAPID STREP SCREEN (MED CTR MEBANE ONLY): Streptococcus, Group A Screen (Direct): NEGATIVE

## 2015-07-01 MED ORDER — LIDOCAINE VISCOUS 2 % MT SOLN
15.0000 mL | Freq: Once | OROMUCOSAL | Status: AC
  Administered 2015-07-01: 15 mL via OROMUCOSAL
  Filled 2015-07-01: qty 15

## 2015-07-01 NOTE — ED Notes (Signed)
Pt complains of sore throat since this morning that has gotten progressively worse throughout the day. Pt states she just got over another cold.

## 2015-07-01 NOTE — ED Provider Notes (Signed)
CSN: [id number]     Arrival date & time 07/01/15  2210 History   First MD Initiated Contact with Patient 07/01/15 2306     Chief Complaint  Patient presents with  . Sore Throat     (Consider location/radiation/quality/duration/timing/severity/associated sxs/prior Treatment) Patient is a 25 y.o. female presenting with pharyngitis. The history is provided by the patient and medical records.  Sore Throat Associated symptoms include a sore throat.     25 year old female with history of migraine headaches, chronic back pain, and asthma, presenting to the ED for sore throat. Patient states this began yesterday and has been progressively worsening since this time. She states her children and her husband are also sick with similar symptoms. She states it is painful to swallow, however she has no difficulty eating or drinking. She denies any fever or chills. She denies any chest pain, shortness of breath, cough, abdominal pain, nausea, or vomiting.  Past Medical History  Diagnosis Date  . Chronic back pain   . Migraine   . Bulging disc   . Constipation   . Asthma     last used 11/29/11   Past Surgical History  Procedure Laterality Date  . No past surgeries     Family History  Problem Relation Age of Onset  . Diabetes Mother   . COPD Mother   . Heart disease Mother   . COPD Father   . Heart disease Father   . Cancer Sister   . Cancer Paternal Aunt   . Diabetes Maternal Grandmother   . Cancer Maternal Grandmother   . COPD Paternal Grandmother   . Heart disease Paternal Grandfather   . Other Neg Hx    Social History  Substance Use Topics  . Smoking status: Current Every Day Smoker -- 0.25 packs/day  . Smokeless tobacco: None  . Alcohol Use: No   OB History    Gravida Para Term Preterm AB TAB SAB Ectopic Multiple Living   0 0 0 0 0 0 1     Review of Systems  HENT: Positive for sore throat.   All other systems reviewed and are negative.     Allergies  Olive oil  and Latex  Home Medications   Prior to Admission medications   Medication Sig Start Date End Date Taking? Authorizing Provider  albuterol (PROVENTIL HFA;VENTOLIN HFA) 108 (90 BASE) MCG/ACT inhaler Inhale 2 puffs into the lungs every 4 (four) hours as needed for wheezing or shortness of breath. 03/09/14  Yes Eber Hong, MD   BP 118/81 mmHg  Pulse 92  Temp(Src) 98.2 F (36.8 C) (Oral)  Resp 18  SpO2 98%  LMP 06/05/2015   Physical Exam  Constitutional: She is oriented to person, place, and time. She appears well-developed and well-nourished. No distress.  HENT:  Head: Normocephalic and atraumatic.  Right Ear: Tympanic membrane and ear canal normal.  Left Ear: Tympanic membrane and ear canal normal.  Nose: Nose normal.  Mouth/Throat: Uvula is midline and mucous membranes are normal. Posterior oropharyngeal erythema present.  Tonsils 1+ bilaterally with small exudates present; uvula midline without evidence of peritonsillar abscess; handling secretions appropriately; no difficulty swallowing or speaking; normal phonation; no stridor  Eyes: Conjunctivae and EOM are normal. Pupils are equal, round, and reactive to light.  Neck: Trachea normal, normal range of motion, full passive range of motion without pain and phonation normal. Neck supple.  Cardiovascular: Normal rate, regular rhythm and normal heart sounds.   Pulmonary/Chest: Effort normal and  breath sounds normal. No respiratory distress. She has no wheezes.  Abdominal: Soft. Bowel sounds are normal.  Musculoskeletal: Normal range of motion.  Lymphadenopathy:    She has cervical adenopathy.  Bilateral anterior cervical lymphadenopathy  Neurological: She is alert and oriented to person, place, and time.  Skin: Skin is warm and dry. She is not diaphoretic.  Psychiatric: She has a normal mood and affect.  Nursing note and vitals reviewed.   ED Course  Procedures (including critical care time) Labs Review Labs Reviewed  RAPID  STREP SCREEN (NOT AT [facility])  CULTURE, GROUP A STREP [facility])    Imaging Review No results found. I have personally reviewed and evaluated these images and lab results as part of my medical decision-making.   EKG Interpretation None      MDM   Final diagnoses:  Sore throat   25 year old female here with sore throat began yesterday. Patient is afebrile, nontoxic. She does have mild tonsillar edema with exudate noted. Her uvula remains midline, no evidence of peritonsillar abscess at this time. Normal phonation, no stridor. She does have bilateral anterior cervical lymphadenopathy.  Rapid strep was sent as is negative, culture pending.  Given her sick contacts and PE findings, will cover for strep throat with amoxicillin.  Encouraged to follow-up with PCP, given resource guide.  Discussed plan with patient, he/she acknowledged understanding and agreed with plan of care.  Return precautions given for new or worsening symptoms.  Garlon Hatchet, PA-C 07/02/15 0008  Bethann Berkshire, MD 07/04/15 [phone]

## 2015-07-02 MED ORDER — AMOXICILLIN 500 MG PO CAPS: 500.0000 mg | ORAL_CAPSULE | Freq: Three times a day (TID) | ORAL | Status: DC

## 2015-07-02 NOTE — Discharge Instructions (Signed)
Take the prescribed medication as directed. Follow-up with your primary care physician.  If you do not have one, see resource guide to find one in the area. Return to the ED for new or worsening symptoms.   Emergency Department Resource Guide 1) Find a Doctor and Pay Out of Pocket Although you won't have to find out who is covered by your insurance plan, it is a good idea to ask around and get recommendations. You will then need to call the office and see if the doctor you have chosen will accept you as a new patient and what types of options they offer for patients who are self-pay. Some doctors offer discounts or will set up payment plans for their patients who do not have insurance, but you will need to ask so you aren't surprised when you get to your appointment.  2) Contact Your Local Health Department Not all health departments have doctors that can see patients for sick visits, but many do, so it is worth a call to see if yours does. If you don't know where your local health department is, you can check in your phone book. The CDC also has a tool to help you locate your state's health department, and many state websites also have listings of all of their local health departments.  3) Find a Walk-in Clinic If your illness is not likely to be very severe or complicated, you may want to try a walk in clinic. These are popping up all over the country in pharmacies, drugstores, and shopping centers. They're usually staffed by nurse practitioners or physician assistants that have been trained to treat common illnesses and complaints. They're usually fairly quick and inexpensive. However, if you have serious medical issues or chronic medical problems, these are probably not your best option.  No Primary Care Doctor: - Call Health Connect at  [phone] - they can help you locate a primary care doctor that  accepts your insurance, provides certain services, etc. - Physician Referral Service-  [phone]  Chronic Pain Problems: Organization         Address  Phone   Notes  [facility]  [phone] Patients need to be referred by their primary care doctor.   Medication Assistance: Organization         Address  Phone   Notes  [facility] [address] [place] [place], [place] [address] [phone] --Must be a resident of [facility] -- Must have NO insurance coverage whatsoever (no Medicaid/ Medicare, etc.) -- The pt. MUST have a primary care doctor that directs their care regularly and follows them in the community   Med[facility]ist  [phone]   [employer]  [phone]    Agencies that provide inexpensive medical care: Organization         Address  Phone   Notes  [facility]  [phone]   [facility]    [phone]   [facility] [address] [place], [place] [address] [phone]   [facility] [address] [place], [place] [phone]   Planned Parenthood    [phone]   [facility]    [phone]   [facility]  [address], [place] Phone:  [phone], Fax:  [phone] Hours of Operation:  9 am - 6 pm, M-F.  Also accepts Medicaid/Medicare and self-pay.  [facility]  [place] for [place], [address], [place] Phone: [phone], Fax: [phone]. Hours of Operation:  8:30 am - 5:30 pm, M-F.  Also accepts Medicaid and self-pay.  [facility] [address] [place] Phone: [phone]   [place], [place], [place] [phone], Ext. [phone] Mondays & Thursdays: 7-9 AM.  First 15 patients are seen on a first come, first serve basis.    [place] Providers:  Organization         Address  Phone   Notes  [facility] [address],  [place] [phone] Also accepts self-pay patients.  [facility] [id number] [place], [place]  [phone]   [place], [address], [place] [phone]   [facility] [address] [place] [phone]   Lucianne Lei [address], [place]   [phone] Only accepts [place] Access [place] patients after they have their name applied to their card.   Self-Pay (no insurance) in [facility]:  Organization         Address  Phone   Notes  Sickle Cell Patients, [facility] [place] [phone]   [facility] [place] [phone]   [facility] [place]  [place], [place], [place] [phone]   [facility]/Dr. Osei-Bonsu  [address], [place] or [address], [place] [phone] Phone number for both [place] and [place] locations is the same.  [facility] [address] [place] [phone]   [facility]  [address], [place] or [address] [place] [address] [phone]   [facility] [address], [place] [phone], phone; [phone], fax Sees patients 1st and 3rd Saturday of every month.  Must not qualify for public or private insurance (i.e. Medicaid, Medicare, [place] Health Choice, Veterans' Benefits)  Household income should be no more than 200% of the poverty level The clinic cannot treat you if you are pregnant or think you are pregnant  Sexually transmitted diseases are not treated at the clinic.    Dental Care: Organization         Address  Phone  Notes  [facility] Department of [facility] [place] [phone] Accepts children up to age 40 who are enrolled in  [place] or [place]; pregnant women with a Medicaid card; and children who have applied for Medicaid or [place] Health Choice, but were declined, whose parents can pay a reduced fee at time of service.  [facility] Department of [facility]  [address], [place] [phone] Accepts children up to age 85 who are enrolled in [place] or [place]; pregnant women with a Medicaid card; and children who have applied for Medicaid or [place] Health Choice, but were declined, whose parents can pay a reduced fee at time of service.  [facility]  [place] [phone] Patients are seen by appointment only. Walk-ins are not accepted. [place] will see patients 62 years of age and older. Monday - Tuesday (8am-5pm) Most Wednesdays (8:30-5pm) $30 per visit,  cash only  [employer] [facility] [employer] PRO[facility]  [address], [place] [phone] Patients are seen by appointment only. Walk-ins are not accepted. [place] will see patients 68 years of age and older. One Wednesday Evening (Monthly: Volunteer Based).  $30 per visit, cash only  [place]  [phone] for adults; Children under age 32, call Graduate Pediatric Dentistry at [phone]. Children aged 36-14, please call [phone] to request a pediatric application.  Dental services are provided in all areas of dental care including fillings, crowns and bridges, complete and partial dentures, implants, gum treatment, root canals, and extractions. Preventive care is also provided. Treatment is provided to both adults and children. Patients are selected via a lottery and there is often a waiting list.   [facility] [address] [place]  [phone] [email or web address]   [facility] [address] [place], [place] [phone], Ext. [phone] Second and Fourth Thursday of each month, opens at 6:30  AM; Clinic ends at 9 AM.  Patients are seen on a first-come first-served basis, and a limited number are seen during each clinic.   [facility]  [address] [place]ard Danker [place], [place] [phone]   Eligibility Requirements You must have lived in [place], [place], or [place] counties for at least the last three months.   You cannot be eligible for state or federal sponsored [employer], including [employer], [place], or [employer].   You generally cannot be eligible for healthcare insurance through your employer.    How to apply: Eligibility screenings are held every Tuesday and Wednesday afternoon from 1:00 pm until 4:00 pm. You do not need an appointment for the interview!  [facility] [address] [place], [place]   [place]  [place] Department  [place]  [phone]    Behavioral Health Resources in the Community: Intensive Outpatient Programs Organization         Address  Phone  Notes  Bejou Flanagan. [address], [place], [place] [phone]   [facility] Outpatient [address] [place], [place]   ADS: [facility] [address] [place], [place]   [place] [address],  [place], [place] or [phone]   Substance Abuse Resources Organization         Address  Phone  Notes  Al[facility] Drug Services  [phone]   [place]  [phone]   [facility] [place]   Chinita Pester  [phone]   Residential & Outpatient Substance Abuse Program  [phone]   Psychological Services Organization         Address  Phone  Notes  [facility] [place]  [place]  [phone]   [place] [address] [place] or  [phone]    Mobile Crisis Teams Organization         Address  Phone  Notes  [facility], Mobile Crisis Care Unit  [phone]   [facility]  [address] [place], [place]   [place] [address], [place] [phone]    Self-Help/Support Groups Organization         Address  Phone  Notes  [facility] [place] - variety of support groups  [place] Call for more information  Narcotics Anonymous (NA), [facility] [address], [employer] [place]  2 meetings at this location   Special educational needs teacher         Address  Phone  Notes  [facility] Residential Treatment [place],    [place]  [phone]   [facility]  [address] [place] [id number], [place], [place]   [place], [place] [phone] Admissions: 8am-3pm M-F  Incentives Substance [place] [address]    [place], [place] [phone]   [facility] [address] [place], [place], [place]   [facility] [address],  [place], [place]   [facility] - Intensive Outpatient [address], [place], [place]   [facility] [address]  [place], [place] [phone] or [phone]   [facility] (RTS) [address] [place], [place] Accepts Medicaid  [facility] [place] [address],  [place] [phone] Substance Abuse/Addiction Treatment   [facility] Organization         Address  Phone  Notes  [facility]  [phone]   Domenic Schwab, PhD [address] Arlis Porta [place], [place]   [phone] or [phone]   [place] and [place], [place] [phone]   [facility] [address],  [place], [place] [phone] Insurance/Medicaid/sponsorship through [facility] and Families [address], [address] [place]                                    [place], [place] [phone] [place] [address], [place] [phone]    Dr. Adele Schilder  [phone]   [facility] Dept. 1) [address], [place] 2) [place] 3)  [place] [address], [place] [phone]  [phone]   [place] [phone] or [phone] (After Hours)

## 2015-07-04 LAB — CULTURE, GROUP A STREP (THRC)

## 2015-09-20 ENCOUNTER — Emergency Department (HOSPITAL_COMMUNITY)
Admission: EM | Admit: 2015-09-20 | Discharge: 2015-09-21 | Disposition: A | Discharge status: Home / Self Care | Payer: Medicaid Other | Attending: Emergency Medicine | Admitting: Emergency Medicine

## 2015-09-20 ENCOUNTER — Encounter (HOSPITAL_COMMUNITY): Payer: Self-pay | Admitting: Oncology

## 2015-09-20 DIAGNOSIS — Z79899 Other long term (current) drug therapy: Secondary | ICD-10-CM | POA: Insufficient documentation

## 2015-09-20 DIAGNOSIS — N390 Urinary tract infection, site not specified: Secondary | ICD-10-CM | POA: Diagnosis not present

## 2015-09-20 DIAGNOSIS — F17200 Nicotine dependence, unspecified, uncomplicated: Secondary | ICD-10-CM | POA: Diagnosis not present

## 2015-09-20 DIAGNOSIS — R110 Nausea: Secondary | ICD-10-CM | POA: Diagnosis not present

## 2015-09-20 DIAGNOSIS — Z8739 Personal history of other diseases of the musculoskeletal system and connective tissue: Secondary | ICD-10-CM | POA: Diagnosis not present

## 2015-09-20 DIAGNOSIS — J45909 Unspecified asthma, uncomplicated: Secondary | ICD-10-CM | POA: Diagnosis not present

## 2015-09-20 DIAGNOSIS — Z91040 Latex allergy status: Secondary | ICD-10-CM | POA: Insufficient documentation

## 2015-09-20 DIAGNOSIS — G8929 Other chronic pain: Secondary | ICD-10-CM | POA: Insufficient documentation

## 2015-09-20 DIAGNOSIS — Z8719 Personal history of other diseases of the digestive system: Secondary | ICD-10-CM | POA: Diagnosis not present

## 2015-09-20 DIAGNOSIS — R319 Hematuria, unspecified: Secondary | ICD-10-CM | POA: Diagnosis present

## 2015-09-20 DIAGNOSIS — Z8679 Personal history of other diseases of the circulatory system: Secondary | ICD-10-CM | POA: Diagnosis not present

## 2015-09-20 LAB — URINE MICROSCOPIC-ADD ON

## 2015-09-20 LAB — URINALYSIS, ROUTINE W REFLEX MICROSCOPIC
Bilirubin Urine: NEGATIVE
Glucose, UA: NEGATIVE mg/dL
Ketones, ur: NEGATIVE mg/dL
NITRITE: NEGATIVE
Protein, ur: NEGATIVE mg/dL
SPECIFIC GRAVITY, URINE: 1.006 (ref 1.005–1.030)
pH: 6.5 (ref 5.0–8.0)

## 2015-09-20 NOTE — ED Notes (Signed)
Pt c/o burning w/ urination, blood on toilet paper after wiping and lower abdominal pain since 0800 this am.

## 2015-09-21 MED ORDER — CEPHALEXIN 500 MG PO CAPS: 500.0000 mg | ORAL_CAPSULE | Freq: Four times a day (QID) | ORAL | Status: DC

## 2015-09-21 MED ORDER — PHENAZOPYRIDINE HCL 200 MG PO TABS
200.0000 mg | ORAL_TABLET | Freq: Three times a day (TID) | ORAL | Status: DC
  Administered 2015-09-21: 200 mg via ORAL
  Filled 2015-09-21: qty 1

## 2015-09-21 MED ORDER — CEPHALEXIN 500 MG PO CAPS
500.0000 mg | ORAL_CAPSULE | Freq: Once | ORAL | Status: AC
  Administered 2015-09-21: 500 mg via ORAL
  Filled 2015-09-21: qty 1

## 2015-09-21 MED ORDER — PHENAZOPYRIDINE HCL 200 MG PO TABS: 200.0000 mg | ORAL_TABLET | Freq: Three times a day (TID) | ORAL | Status: DC

## 2015-09-21 NOTE — Discharge Instructions (Signed)

## 2015-09-21 NOTE — ED Provider Notes (Signed)
CSN: [id number]     Arrival date & time 09/20/15  2226 History   First MD Initiated Contact with Patient 09/21/15 0245     Chief Complaint  Patient presents with  . Urinary Tract Infection     (Consider location/radiation/quality/duration/timing/severity/associated sxs/prior Treatment) Patient is a 25 y.o. female presenting with urinary tract infection. The history is provided by the patient and the nursing home. No language interpreter was used.  Urinary Tract Infection Pain quality:  Burning Pain severity:  Mild Duration:  1 day Timing:  Constant Chronicity:  New Recent urinary tract infections: no   Urinary symptoms: hematuria   Associated symptoms: abdominal pain and nausea   Associated symptoms: no fever, no flank pain, no vaginal discharge and no vomiting     Past Medical History  Diagnosis Date  . Chronic back pain   . Migraine   . Bulging disc   . Constipation   . Asthma     last used 11/29/11   Past Surgical History  Procedure Laterality Date  . No past surgeries     Family History  Problem Relation Age of Onset  . Diabetes Mother   . COPD Mother   . Heart disease Mother   . COPD Father   . Heart disease Father   . Cancer Sister   . Cancer Paternal Aunt   . Diabetes Maternal Grandmother   . Cancer Maternal Grandmother   . COPD Paternal Grandmother   . Heart disease Paternal Grandfather   . Other Neg Hx    Social History  Substance Use Topics  . Smoking status: Current Every Day Smoker -- 0.25 packs/day  . Smokeless tobacco: Never Used  . Alcohol Use: No   OB History    Gravida Para Term Preterm AB TAB SAB Ectopic Multiple Living   1 1 1  0 0 0 0 0 0 1     Review of Systems  Constitutional: Negative for fever.  Respiratory: Negative for shortness of breath.   Cardiovascular: Negative for chest pain.  Gastrointestinal: Positive for nausea and abdominal pain. Negative for vomiting.  Genitourinary: Positive for dysuria and hematuria. Negative for  flank pain and vaginal discharge.  Musculoskeletal: Negative for myalgias.      Allergies  Olive oil and Latex  Home Medications   Prior to Admission medications   Medication Sig Start Date End Date Taking? Authorizing Provider  albuterol (PROVENTIL HFA;VENTOLIN HFA) 108 (90 BASE) MCG/ACT inhaler Inhale 2 puffs into the lungs every 4 (four) hours as needed for wheezing or shortness of breath. 03/09/14  Yes Eber HongBrian Miller, MD  amoxicillin (AMOXIL) 500 MG capsule Take 1 capsule (500 mg total) by mouth 3 (three) times daily. Patient not taking: Reported on 09/21/2015 07/02/15   Garlon HatchetLisa M Sanders, PA-C   BP 117/81 mmHg  Pulse 87  Temp(Src) 98.5 F (36.9 C) (Oral)  Resp 14  Ht 5\' 2"  (1.575 m)  Wt 102.059 kg  BMI 41.14 kg/m2  SpO2 100%  LMP 09/05/2015 (Approximate) Physical Exam  Constitutional: She is oriented to person, place, and time. She appears well-developed and well-nourished. No distress.  Pulmonary/Chest: Effort normal.  Abdominal: Soft.  Minimal abdominal tenderness.   Musculoskeletal: Normal range of motion.  Neurological: She is alert and oriented to person, place, and time.  Skin: Skin is warm and dry.  Psychiatric: She has a normal mood and affect.    ED Course  Procedures (including critical care time) Labs Review Labs Reviewed  URINALYSIS, ROUTINE W REFLEX MICROSCOPIC (  NOT AT [facility]) - Abnormal; Notable for the following:    APPearance CLOUDY (*)    Hgb urine dipstick LARGE (*)    Leukocytes, UA LARGE (*)    All other components within normal limits  URINE MICROSCOPIC-ADD ON - Abnormal; Notable for the following:    Squamous Epithelial / LPF 0-5 (*)    Bacteria, UA MANY (*)    All other components within normal limits    Imaging Review No results found. I have personally reviewed and evaluated these images and lab results as part of my medical decision-making.   EKG Interpretation None      MDM   Final diagnoses:  None    1. UTI,  uncomplicated  The patient is well appearing, no sign of urosepsis, with UTI confirmed on lab testing and corresponding symptoms. She can be discharged home with abx, pyridium and return precautions.     Elpidio Anis, PA-C 09/21/15 1610  Gilda Crease, MD 09/22/15 [phone]

## 2016-02-11 ENCOUNTER — Encounter: Payer: Self-pay | Admitting: *Deleted

## 2016-02-11 LAB — LAB REPORT - SCANNED: PAP SMEAR: NEGATIVE

## 2016-06-29 ENCOUNTER — Encounter (HOSPITAL_COMMUNITY): Payer: Self-pay | Admitting: Nurse Practitioner

## 2016-06-29 ENCOUNTER — Emergency Department (HOSPITAL_COMMUNITY)
Admission: EM | Admit: 2016-06-29 | Discharge: 2016-06-29 | Disposition: A | Discharge status: Home / Self Care | Payer: Medicaid Other | Attending: Physician Assistant | Admitting: Physician Assistant

## 2016-06-29 DIAGNOSIS — J40 Bronchitis, not specified as acute or chronic: Secondary | ICD-10-CM | POA: Insufficient documentation

## 2016-06-29 DIAGNOSIS — Z91040 Latex allergy status: Secondary | ICD-10-CM | POA: Insufficient documentation

## 2016-06-29 DIAGNOSIS — J069 Acute upper respiratory infection, unspecified: Secondary | ICD-10-CM | POA: Diagnosis present

## 2016-06-29 DIAGNOSIS — F17200 Nicotine dependence, unspecified, uncomplicated: Secondary | ICD-10-CM | POA: Insufficient documentation

## 2016-06-29 DIAGNOSIS — Z79899 Other long term (current) drug therapy: Secondary | ICD-10-CM | POA: Diagnosis not present

## 2016-06-29 MED ORDER — PREDNISONE 20 MG PO TABS: ORAL_TABLET | ORAL | 0 refills | Status: DC

## 2016-06-29 MED ORDER — BENZONATATE 100 MG PO CAPS: 100.0000 mg | ORAL_CAPSULE | Freq: Three times a day (TID) | ORAL | 0 refills | Status: DC

## 2016-06-29 MED ORDER — ALBUTEROL SULFATE HFA 108 (90 BASE) MCG/ACT IN AERS
2.0000 | INHALATION_SPRAY | RESPIRATORY_TRACT | Status: DC | PRN
  Administered 2016-06-29: 2 via RESPIRATORY_TRACT
  Filled 2016-06-29: qty 6.7

## 2016-06-29 NOTE — ED Triage Notes (Signed)
Pt presents with c/o URI. She reports headaches, body aches, congestion, cough, sore throat. She denies fevers, chills, n/v/d. She has tried nyquil, mucinex, inhalers, honey lemon tea with no relief of symptoms.

## 2016-06-29 NOTE — ED Provider Notes (Signed)
[facility]-EMERGENCY DEPT Provider Note     By signing my name below, I, Earmon Phoenix, attest that this documentation has been prepared under the direction and in the presence of Fayrene Helper, PA-C. Electronically Signed: Earmon Phoenix, ED Scribe. 06/29/16. 1:41 PM.    History   Chief Complaint Chief Complaint  Patient presents with  . URI   The history is provided by the patient and medical records. No language interpreter was used.    Cassandra Harrison is an obese 26 y.o. female with PMHx of asthma who presents to the Emergency Department complaining of URI symptoms that began about one week ago. She reports associated HA, wheezing, chest tightness, generalized body aches, nasal congestion, dry cough and sore throat. She has taken Nyquil, Mucinex, used her MDI (average of four times daily for the past two days) and drank honey lemon tea with no significant relief. Pt denies modifying factors. She reports sick contacts at onset of symptoms. She denies fever, chills, nausea, vomiting, diarrhea, rash, drooling, difficulty breathing or swallowing. She is a smoker. She denies allergies to any medications.   Past Medical History:  Diagnosis Date  . Asthma    last used 11/29/11  . Bulging disc   . Chronic back pain   . Constipation   . Migraine     There are no active problems to display for this patient.   Past Surgical History:  Procedure Laterality Date  . NO PAST SURGERIES      OB History    Gravida Para Term Preterm AB Living   1 1 1  0 0 1   SAB TAB Ectopic Multiple Live Births   0 0 0 0 1       Home Medications    Prior to Admission medications   Medication Sig Start Date End Date Taking? Authorizing Provider  albuterol (PROVENTIL HFA;VENTOLIN HFA) 108 (90 BASE) MCG/ACT inhaler Inhale 2 puffs into the lungs every 4 (four) hours as needed for wheezing or shortness of breath. 03/09/14   Eber Hong, MD  amoxicillin (AMOXIL) 500 MG capsule Take 1 capsule (500 mg  total) by mouth 3 (three) times daily. Patient not taking: Reported on 09/21/2015 07/02/15   Garlon Hatchet, PA-C  cephALEXin (KEFLEX) 500 MG capsule Take 1 capsule (500 mg total) by mouth 4 (four) times daily. 09/21/15   Elpidio Anis, PA-C  phenazopyridine (PYRIDIUM) 200 MG tablet Take 1 tablet (200 mg total) by mouth 3 (three) times daily with meals. 09/21/15   Elpidio Anis, PA-C    Family History Family History  Problem Relation Age of Onset  . Diabetes Mother   . COPD Mother   . Heart disease Mother   . COPD Father   . Heart disease Father   . Cancer Sister   . Cancer Paternal Aunt   . Diabetes Maternal Grandmother   . Cancer Maternal Grandmother   . COPD Paternal Grandmother   . Heart disease Paternal Grandfather   . Other Neg Hx     Social History Social History  Substance Use Topics  . Smoking status: Current Every Day Smoker    Packs/day: 0.25  . Smokeless tobacco: Never Used  . Alcohol use No     Allergies   Olive oil and Latex   Review of Systems Review of Systems  Constitutional: Negative for chills and fever.  HENT: Positive for congestion and sore throat. Negative for drooling and trouble swallowing.   Respiratory: Positive for cough, chest tightness and wheezing. Negative  for shortness of breath.   Gastrointestinal: Negative for diarrhea, nausea and vomiting.  Musculoskeletal: Positive for myalgias.  Neurological: Positive for headaches.     Physical Exam Updated Vital Signs BP 110/75   Pulse 97   Temp 98 F (36.7 C)   Resp 18   SpO2 95%   Physical Exam  Constitutional: She is oriented to person, place, and time. She appears well-developed and well-nourished.  HENT:  Head: Normocephalic and atraumatic.  Right Ear: Tympanic membrane is erythematous. No middle ear effusion.  Left Ear: Tympanic membrane is erythematous.  No middle ear effusion.  Nose: Rhinorrhea present.  Mouth/Throat: Uvula is midline, oropharynx is clear and moist and mucous  membranes are normal. No tonsillar exudate.  TMs mildly erythematous bilaterally with no signs of effusion.  Neck: Normal range of motion.  Cardiovascular: Normal rate, regular rhythm and normal heart sounds.   Pulmonary/Chest: Effort normal. No respiratory distress. She has wheezes. She has rhonchi.  Mild expiratory wheezes with scattered rhonchi.  Musculoskeletal: Normal range of motion.  Neurological: She is alert and oriented to person, place, and time.  Skin: Skin is warm and dry.  Psychiatric: She has a normal mood and affect. Her behavior is normal.  Nursing note and vitals reviewed.    ED Treatments / Results  DIAGNOSTIC STUDIES: Oxygen Saturation is 95% on RA, adequate by my interpretation.   COORDINATION OF CARE: 1:36 PM- Will refill MDI and prescribe cough medication. Pt verbalizes understanding and agrees to plan.  Medications  albuterol (PROVENTIL HFA;VENTOLIN HFA) 108 (90 Base) MCG/ACT inhaler 2 puff (not administered)    Labs (all labs ordered are listed, but only abnormal results are displayed) Labs Reviewed - No data to display  EKG  EKG Interpretation None       Radiology No results found.  Procedures Procedures (including critical care time)  Medications Ordered in ED Medications  albuterol (PROVENTIL HFA;VENTOLIN HFA) 108 (90 Base) MCG/ACT inhaler 2 puff (not administered)     Initial Impression / Assessment and Plan / ED Course  I have reviewed the triage vital signs and the nursing notes.  Pertinent labs & imaging results that were available during my care of the patient were reviewed by me and considered in my medical decision making (see chart for details).     Pt symptoms consistent with URI/bronchitis. CXR not indicated at this time. Pt will be discharged with symptomatic treatment.  Discussed return precautions.  Pt is hemodynamically stable & in NAD prior to discharge.   I personally performed the services described in this  documentation, which was scribed in my presence. The recorded information has been reviewed and is accurate.     Final Clinical Impressions(s) / ED Diagnoses   Final diagnoses:  Bronchitis    New Prescriptions New Prescriptions   BENZONATATE (TESSALON) 100 MG CAPSULE    Take 1 capsule (100 mg total) by mouth every 8 (eight) hours.   PREDNISONE (DELTASONE) 20 MG TABLET    2 tabs po daily x 4 days     Fayrene HelperBowie Alanii Ramer, PA-C 06/29/16 1345    Courteney Lyn Mackuen, MD 06/29/16 1428

## 2016-06-29 NOTE — Discharge Instructions (Signed)
Use albuterol inhaler 2 puffs every 4 hours as needed for wheezing and shortness of breath.

## 2016-10-01 ENCOUNTER — Ambulatory Visit (INDEPENDENT_AMBULATORY_CARE_PROVIDER_SITE_OTHER): Payer: Medicaid Other | Admitting: Obstetrics

## 2016-10-01 ENCOUNTER — Other Ambulatory Visit (HOSPITAL_COMMUNITY)
Admission: RE | Admit: 2016-10-01 | Discharge: 2016-10-01 | Disposition: A | Discharge status: Home / Self Care | Payer: Medicaid Other | Source: Ambulatory Visit | Attending: Obstetrics | Admitting: Obstetrics

## 2016-10-01 ENCOUNTER — Encounter: Payer: Self-pay | Admitting: Obstetrics

## 2016-10-01 VITALS — BP 115/82 | HR 81 | Ht 62.25 in | Wt 241.5 lb

## 2016-10-01 DIAGNOSIS — Z113 Encounter for screening for infections with a predominantly sexual mode of transmission: Secondary | ICD-10-CM

## 2016-10-01 DIAGNOSIS — Z3202 Encounter for pregnancy test, result negative: Secondary | ICD-10-CM

## 2016-10-01 DIAGNOSIS — N898 Other specified noninflammatory disorders of vagina: Secondary | ICD-10-CM | POA: Diagnosis not present

## 2016-10-01 DIAGNOSIS — Z01419 Encounter for gynecological examination (general) (routine) without abnormal findings: Secondary | ICD-10-CM

## 2016-10-01 DIAGNOSIS — Z0000 Encounter for general adult medical examination without abnormal findings: Secondary | ICD-10-CM

## 2016-10-01 LAB — POCT URINE PREGNANCY: Preg Test, Ur: NEGATIVE

## 2016-10-01 NOTE — Progress Notes (Signed)
Subjective:        Cassandra Harrison is a 26 y.o. female here for a routine exam.  Current complaints: Seasonal allergies.    Personal health questionnaire:  Is patient Ashkenazi Jewish, have a family history of breast and/or ovarian cancer: no Is there a family history of uterine cancer diagnosed at age < 63, gastrointestinal cancer, urinary tract cancer, family member who is a Personnel officer syndrome-associated carrier: no Is the patient overweight and hypertensive, family history of diabetes, personal history of gestational diabetes, preeclampsia or PCOS: no Is patient over 42, have PCOS,  family history of premature CHD under age 90, diabetes, smoke, have hypertension or peripheral artery disease:  no At any time, has a partner hit, kicked or otherwise hurt or frightened you?: no Over the past 2 weeks, have you felt down, depressed or hopeless?: no Over the past 2 weeks, have you felt little interest or pleasure in doing things?:no   Gynecologic History No LMP recorded. Contraception: unknown Last Pap: 2015. Results were: unknown Last mammogram: n/a. Results were: n/a  Obstetric History OB History  Gravida Para Term Preterm AB Living  0 0 1  SAB TAB Ectopic Multiple Live Births  0 0 0 0 1    # Outcome Date GA Lbr Len/2nd Weight Sex Delivery Anes PTL Lv  1 Term 12/08/11 [redacted]w[redacted]d / 01:52 7 lb 11.3 oz (3.495 kg) F Vag-Spont EPI  LIV      Past Medical History:  Diagnosis Date  . Asthma    last used 11/29/11  . Bulging disc   . Chronic back pain   . Constipation   . Migraine     Past Surgical History:  Procedure Laterality Date  . NO PAST SURGERIES       Current Outpatient Prescriptions:  .  albuterol (PROVENTIL HFA;VENTOLIN HFA) 108 (90 BASE) MCG/ACT inhaler, Inhale 2 puffs into the lungs every 4 (four) hours as needed for wheezing or shortness of breath., Disp: 1 Inhaler, Rfl: 3 .  Cetirizine HCl (ZYRTEC ALLERGY) 10 MG CAPS, Take by mouth daily., Disp: , Rfl:   Allergies  Allergen Reactions  . Olive Oil Nausea And Vomiting    She can not have anything with olives in it  . Latex Rash    Patient states that she is allergic to latex condoms.  They give her a rash and a yeast infection.    Social History  Substance Use Topics  . Smoking status: Current Every Day Smoker    Packs/day: 0.25  . Smokeless tobacco: Never Used  . Alcohol use No    Family History  Problem Relation Age of Onset  . Diabetes Mother   . COPD Mother   . Heart disease Mother   . COPD Father   . Heart disease Father   . Cancer Sister   . Cancer Paternal Aunt   . Diabetes Maternal Grandmother   . Cancer Maternal Grandmother   . COPD Paternal Grandmother   . Heart disease Paternal Grandfather   . Other Neg Hx       Review of Systems  Constitutional: negative for fatigue and weight loss Respiratory: positive for seasonal allergic rhinitis Cardiovascular: negative for chest pain, fatigue and palpitations Gastrointestinal: negative for abdominal pain and change in bowel habits Musculoskeletal:negative for myalgias Neurological: negative for gait problems and tremors Behavioral/Psych: negative for abusive relationship, depression Endocrine: negative for temperature intolerance    Genitourinary:negative for abnormal menstrual periods, genital lesions, hot flashes, sexual  problems and vaginal discharge Integument/breast: negative for breast lump, breast tenderness, nipple discharge and skin lesion(s)    Objective:       BP 115/82   Pulse 81   Ht 5' 2.25" (1.581 m)   Wt 241 lb 8 oz (109.5 kg)   BMI 43.82 kg/m  General:   alert  Skin:   no rash or abnormalities  Lungs:   clear to auscultation bilaterally  Heart:   regular rate and rhythm, S1, S2 normal, no murmur, click, rub or gallop  Breasts:   normal without suspicious masses, skin or nipple changes or axillary nodes  Abdomen:  normal findings: no organomegaly, soft, non-tender and no hernia  Pelvis:   External genitalia: normal general appearance Urinary system: urethral meatus normal and bladder without fullness, nontender Vaginal: normal without tenderness, induration or masses Cervix: normal appearance Adnexa: normal bimanual exam Uterus: anteverted and non-tender, normal size   Lab Review Urine pregnancy test Labs reviewed yes Radiologic studies reviewed no  50% of 20 min visit spent on counseling and coordination of care.    Assessment:    Healthy female exam.    Morbid Obesity ( BMI = 43 )  Contraceptive Counseling and Advice.  Needs tobacco cessation for hormonal contraceptive use.   Plan:    Education reviewed: calcium supplements, depression evaluation, low fat, low cholesterol diet, safe sex/STD prevention, self breast exams, smoking cessation and weight bearing exercise. Contraception: considering options. Follow up in: 1 year.   Meds ordered this encounter  Medications  . Cetirizine HCl (ZYRTEC ALLERGY) 10 MG CAPS    Sig: Take by mouth daily.   Orders Placed This Encounter  Procedures  . POCT urine pregnancy

## 2016-10-02 LAB — CERVICOVAGINAL ANCILLARY ONLY
Bacterial vaginitis: POSITIVE — AB
Candida vaginitis: NEGATIVE
Chlamydia: NEGATIVE
Neisseria Gonorrhea: NEGATIVE
Trichomonas: NEGATIVE

## 2016-10-02 LAB — CYTOLOGY - PAP: DIAGNOSIS: NEGATIVE

## 2016-10-03 ENCOUNTER — Other Ambulatory Visit: Payer: Self-pay | Admitting: Obstetrics

## 2016-10-03 DIAGNOSIS — B9689 Other specified bacterial agents as the cause of diseases classified elsewhere: Secondary | ICD-10-CM

## 2016-10-03 DIAGNOSIS — N760 Acute vaginitis: Principal | ICD-10-CM

## 2016-10-03 MED ORDER — METRONIDAZOLE 500 MG PO TABS: 500.0000 mg | ORAL_TABLET | Freq: Two times a day (BID) | ORAL | 2 refills | Status: DC

## 2016-12-10 ENCOUNTER — Encounter (HOSPITAL_COMMUNITY): Payer: Self-pay | Admitting: Emergency Medicine

## 2016-12-10 ENCOUNTER — Emergency Department (HOSPITAL_COMMUNITY)
Admission: EM | Admit: 2016-12-10 | Discharge: 2016-12-10 | Disposition: A | Discharge status: Home / Self Care | Payer: Medicaid Other | Attending: Emergency Medicine | Admitting: Emergency Medicine

## 2016-12-10 ENCOUNTER — Emergency Department (HOSPITAL_COMMUNITY): Payer: Medicaid Other

## 2016-12-10 DIAGNOSIS — F17200 Nicotine dependence, unspecified, uncomplicated: Secondary | ICD-10-CM | POA: Diagnosis not present

## 2016-12-10 DIAGNOSIS — A084 Viral intestinal infection, unspecified: Secondary | ICD-10-CM | POA: Diagnosis not present

## 2016-12-10 DIAGNOSIS — Z91040 Latex allergy status: Secondary | ICD-10-CM | POA: Diagnosis not present

## 2016-12-10 DIAGNOSIS — J45909 Unspecified asthma, uncomplicated: Secondary | ICD-10-CM | POA: Diagnosis not present

## 2016-12-10 DIAGNOSIS — R1011 Right upper quadrant pain: Secondary | ICD-10-CM

## 2016-12-10 LAB — URINALYSIS, ROUTINE W REFLEX MICROSCOPIC
BILIRUBIN URINE: NEGATIVE
Glucose, UA: NEGATIVE mg/dL
Hgb urine dipstick: NEGATIVE
Ketones, ur: NEGATIVE mg/dL
LEUKOCYTES UA: NEGATIVE
NITRITE: NEGATIVE
Protein, ur: NEGATIVE mg/dL
SPECIFIC GRAVITY, URINE: 1.028 (ref 1.005–1.030)
pH: 5 (ref 5.0–8.0)

## 2016-12-10 LAB — CBC WITH DIFFERENTIAL/PLATELET
BASOS ABS: 0 10*3/uL (ref 0.0–0.1)
Basophils Relative: 0 %
EOS PCT: 1 %
Eosinophils Absolute: 0.1 10*3/uL (ref 0.0–0.7)
HEMATOCRIT: 43.7 % (ref 36.0–46.0)
Hemoglobin: 15 g/dL (ref 12.0–15.0)
LYMPHS PCT: 11 %
Lymphs Abs: 0.9 10*3/uL (ref 0.7–4.0)
MCH: 28 pg (ref 26.0–34.0)
MCHC: 34.3 g/dL (ref 30.0–36.0)
MCV: 81.7 fL (ref 78.0–100.0)
Monocytes Absolute: 0.4 10*3/uL (ref 0.1–1.0)
Monocytes Relative: 4 %
NEUTROS ABS: 7.6 10*3/uL (ref 1.7–7.7)
Neutrophils Relative %: 84 %
PLATELETS: 244 10*3/uL (ref 150–400)
RBC: 5.35 MIL/uL — AB (ref 3.87–5.11)
RDW: 12.9 % (ref 11.5–15.5)
WBC: 9 10*3/uL (ref 4.0–10.5)

## 2016-12-10 LAB — COMPREHENSIVE METABOLIC PANEL
ALBUMIN: 4.1 g/dL (ref 3.5–5.0)
ALT: 87 U/L — AB (ref 14–54)
AST: 72 U/L — AB (ref 15–41)
Alkaline Phosphatase: 69 U/L (ref 38–126)
Anion gap: 10 (ref 5–15)
BUN: 12 mg/dL (ref 6–20)
CHLORIDE: 107 mmol/L (ref 101–111)
CO2: 19 mmol/L — ABNORMAL LOW (ref 22–32)
CREATININE: 0.73 mg/dL (ref 0.44–1.00)
Calcium: 9.6 mg/dL (ref 8.9–10.3)
GFR calc Af Amer: >60 mL/min (ref >60)
GLUCOSE: 111 mg/dL — AB (ref 65–99)
Potassium: 4.3 mmol/L (ref 3.5–5.1)
Sodium: 136 mmol/L (ref 135–145)
Total Bilirubin: 0.5 mg/dL (ref 0.3–1.2)
Total Protein: 7.6 g/dL (ref 6.5–8.1)

## 2016-12-10 LAB — I-STAT BETA HCG BLOOD, ED (MC, WL, AP ONLY): I-stat hCG, quantitative: <5 m[IU]/mL (ref <5)

## 2016-12-10 LAB — LIPASE, BLOOD: LIPASE: 17 U/L (ref 11–51)

## 2016-12-10 MED ORDER — DICYCLOMINE HCL 20 MG PO TABS: 20.0000 mg | ORAL_TABLET | Freq: Two times a day (BID) | ORAL | 0 refills | Status: DC

## 2016-12-10 MED ORDER — SODIUM CHLORIDE 0.9 % IV BOLUS (SEPSIS)
1000.0000 mL | Freq: Once | INTRAVENOUS | Status: AC
  Administered 2016-12-10: 1000 mL via INTRAVENOUS

## 2016-12-10 MED ORDER — IBUPROFEN 200 MG PO TABS
600.0000 mg | ORAL_TABLET | Freq: Once | ORAL | Status: AC
  Administered 2016-12-10: 600 mg via ORAL
  Filled 2016-12-10: qty 3

## 2016-12-10 MED ORDER — ONDANSETRON HCL 4 MG/2ML IJ SOLN
4.0000 mg | Freq: Once | INTRAMUSCULAR | Status: AC
Start: 2016-12-10 — End: 2016-12-10
  Administered 2016-12-10: 4 mg via INTRAVENOUS
  Filled 2016-12-10: qty 2

## 2016-12-10 MED ORDER — ONDANSETRON 4 MG PO TBDP
4.0000 mg | ORAL_TABLET | Freq: Once | ORAL | Status: AC
  Administered 2016-12-10: 4 mg via ORAL
  Filled 2016-12-10: qty 1

## 2016-12-10 MED ORDER — LOPERAMIDE HCL 2 MG PO CAPS: 2.0000 mg | ORAL_CAPSULE | Freq: Four times a day (QID) | ORAL | 0 refills | Status: DC | PRN

## 2016-12-10 MED ORDER — MORPHINE SULFATE (PF) 2 MG/ML IV SOLN
4.0000 mg | Freq: Once | INTRAVENOUS | Status: DC
  Filled 2016-12-10: qty 2

## 2016-12-10 MED ORDER — ONDANSETRON 4 MG PO TBDP: 4.0000 mg | ORAL_TABLET | Freq: Three times a day (TID) | ORAL | 0 refills | Status: DC | PRN

## 2016-12-10 NOTE — ED Provider Notes (Signed)
[facility]-EMERGENCY DEPT Provider Note   CSN: [id number] Arrival date & time: 12/10/16  0434     History   Chief Complaint Chief Complaint  Patient presents with  . Abdominal Pain    HPI Cassandra Harrison is a 26 y.o. female.  HPI   Patient is a 26 year old female with no pertinent past medical history presents the ED with complaint of abdominal pain, onset yesterday morning. Patient reports having gradually worsening waxing and waning sharp pain to her right upper abdomen. Endorses associated nausea, NBNB vomiting and nonbloody diarrhea for the past day. She notes her pain worsen last night resulting her coming to the ED for evaluation. Denies taking any medications at home for her symptoms. Reports her pain is worse when laying supine or on her right side. Denies fever, chills, chest pain, shortness of breath, hematemesis, flank pain, urinary symptoms, blood in urine or stool. Patient reports she is currently on her menstrual cycle. Denies history of abdominal surgeries. Denies any recent hospitalizations, recent antibiotic use or recent travel outside of the [place]. Denies drinking from any freshwater sources. Denies any known sick contacts.  Past Medical History:  Diagnosis Date  . Asthma    last used 11/29/11  . Bulging disc   . Chronic back pain   . Constipation   . Migraine     There are no active problems to display for this patient.   Past Surgical History:  Procedure Laterality Date  . NO PAST SURGERIES      OB History    Gravida Para Term Preterm AB Living   1 1 1  0 0 1   SAB TAB Ectopic Multiple Live Births   0 0 0 0 1       Home Medications    Prior to Admission medications   Medication Sig Start Date End Date Taking? Authorizing Provider  albuterol (PROVENTIL HFA;VENTOLIN HFA) 108 (90 BASE) MCG/ACT inhaler Inhale 2 puffs into the lungs every 4 (four) hours as needed for wheezing or shortness of breath. 03/09/14  Yes Eber HongMiller, Brian, MD  Cetirizine HCl (ZYRTEC  ALLERGY) 10 MG CAPS Take 10 mg by mouth daily.    Yes [provider]  dicyclomine (BENTYL) 20 MG tablet Take 1 tablet (20 mg total) by mouth 2 (two) times daily. 12/10/16   Barrett HenleNadeau, Aulani Shipton Elizabeth, PA-C  loperamide (IMODIUM) 2 MG capsule Take 1 capsule (2 mg total) by mouth 4 (four) times daily as needed for diarrhea or loose stools. 12/10/16   Barrett HenleNadeau, Peng Thorstenson Elizabeth, PA-C  metroNIDAZOLE (FLAGYL) 500 MG tablet Take 1 tablet (500 mg total) by mouth 2 (two) times daily. Patient not taking: Reported on 12/10/2016 10/03/16   Brock BadHarper, Charles A, MD  ondansetron (ZOFRAN ODT) 4 MG disintegrating tablet Take 1 tablet (4 mg total) by mouth every 8 (eight) hours as needed for nausea or vomiting. 12/10/16   Barrett HenleNadeau, Earmon Sherrow Elizabeth, PA-C    Family History Family History  Problem Relation Age of Onset  . Diabetes Mother   . COPD Mother   . Heart disease Mother   . COPD Father   . Heart disease Father   . Cancer Sister   . Cancer Paternal Aunt   . Diabetes Maternal Grandmother   . Cancer Maternal Grandmother   . COPD Paternal Grandmother   . Heart disease Paternal Grandfather   . Other Neg Hx     Social History Social History  Substance Use Topics  . Smoking status: Current Every Day Smoker  Packs/day: 0.25  . Smokeless tobacco: Never Used  . Alcohol use No     Allergies   Olive oil and Latex   Review of Systems Review of Systems  Gastrointestinal: Positive for abdominal pain, diarrhea, nausea and vomiting.  All other systems reviewed and are negative.    Physical Exam Updated Vital Signs BP 104/71 (BP Location: Left Arm)   Pulse 84   Temp 98.2 F (36.8 C) (Oral)   Resp 20   Ht 5\' 2"  (1.575 m)   Wt 107.2 kg (236 lb 6.4 oz)   LMP 12/10/2016 (Exact Date)   SpO2 99%   BMI 43.24 kg/m   Physical Exam  Constitutional: She is oriented to person, place, and time. She appears well-developed and well-nourished. No distress.  HENT:  Head: Normocephalic and atraumatic.    Mouth/Throat: Oropharynx is clear and moist. No oropharyngeal exudate.  Eyes: Conjunctivae and EOM are normal. Right eye exhibits no discharge. Left eye exhibits no discharge. No scleral icterus.  Neck: Normal range of motion. Neck supple.  Cardiovascular: Normal rate, regular rhythm, normal heart sounds and intact distal pulses.   Pulmonary/Chest: Effort normal and breath sounds normal. No respiratory distress. She has no wheezes. She has no rales. She exhibits no tenderness.  Abdominal: Soft. Normal appearance and bowel sounds are normal. She exhibits no distension and no mass. There is tenderness in the right upper quadrant, epigastric area and periumbilical area. There is positive Murphy's sign. There is no rigidity, no rebound, no guarding and no CVA tenderness. No hernia.  Musculoskeletal: She exhibits no edema.  Neurological: She is alert and oriented to person, place, and time.  Skin: Skin is warm and dry. She is not diaphoretic.  Nursing note and vitals reviewed.    ED Treatments / Results  Labs (all labs ordered are listed, but only abnormal results are displayed) Labs Reviewed  CBC WITH DIFFERENTIAL/PLATELET - Abnormal; Notable for the following:       Result Value   RBC 5.35 (*)    All other components within normal limits  COMPREHENSIVE METABOLIC PANEL - Abnormal; Notable for the following:    CO2 19 (*)    Glucose, Bld 111 (*)    AST 72 (*)    ALT 87 (*)    All other components within normal limits  URINALYSIS, ROUTINE W REFLEX MICROSCOPIC - Abnormal; Notable for the following:    APPearance HAZY (*)    All other components within normal limits  LIPASE, BLOOD  I-STAT BETA HCG BLOOD, ED ([facility], [facility], [facility] ONLY)    EKG  EKG Interpretation None       Radiology US Abdomen Limited Ruq  Result Date: 12/10/2016 CLINICAL DATA:  Right upper quadrant abdominal pain starting yesterday. EXAM: ULTRASOUND ABDOMEN LIMITED RIGHT UPPER QUADRANT COMPARISON:  06/29/2014 FINDINGS:  Gallbladder: No gallstones or wall thickening visualized. No sonographic Murphy sign noted by sonographer. Common bile duct: Diameter: 4 mm Liver: No focal lesion identified. Coarse echogenic liver with poor sonic penetration compatible with diffuse hepatic steatosis. IMPRESSION: 1. Coarse echogenic liver with poor sonic penetration compatible with diffuse hepatic steatosis. Otherwise unremarkable. Electronically Signed   By: Gaylyn Rong M.D.   On: 12/10/2016 08:25    Procedures Procedures (including critical care time)  Medications Ordered in ED Medications  ondansetron (ZOFRAN-ODT) disintegrating tablet 4 mg (4 mg Oral Given 12/10/16 0605)  sodium chloride 0.9 % bolus 1,000 mL (1,000 mLs Intravenous New Bag/Given 12/10/16 0758)  ondansetron (ZOFRAN) injection 4 mg (4  mg Intravenous Given 12/10/16 0758)  ibuprofen (ADVIL,MOTRIN) tablet 600 mg (600 mg Oral Given 12/10/16 0815)     Initial Impression / Assessment and Plan / ED Course  I have reviewed the triage vital signs and the nursing notes.  Pertinent labs & imaging results that were available during my care of the patient were reviewed by me and considered in my medical decision making (see chart for details).     Patient presents with right upper abdominal pain with associated nausea, vomiting and diarrhea that started yesterday. Denies any known sick contacts. Denies fever. VSS. Exam revealed tenderness over right upper quadrant, epigastric and periumbilical region with positive Murphy sign. Remaining exam unremarkable. Patient given IV fluids, pain meds and antiemetics. Pregnancy negative. Mildly elevated LFTs, AST 72, ALT 87. Remaining labs unremarkable. Due to patient's elevated LFTs and abdominal tenderness on exam, will order abdominal ultrasound for further evaluation of gallbladder/liver. Ultrasound revealed diffuse hepatic stated ptosis, otherwise unremarkable, no gallstones or wall thickening. On reevaluation patient is sitting  resting comfortably in bed and reports improvement of symptoms. Tolerating PO. Suspect patient's symptoms are likely due to viral gastroenteritis. Plan discharge patient home with symptomatically treatment in piece P follow-up. Discussed return precautions.  Final Clinical Impressions(s) / ED Diagnoses   Final diagnoses:  RUQ pain  Viral gastroenteritis    New Prescriptions New Prescriptions   DICYCLOMINE (BENTYL) 20 MG TABLET    Take 1 tablet (20 mg total) by mouth 2 (two) times daily.   LOPERAMIDE (IMODIUM) 2 MG CAPSULE    Take 1 capsule (2 mg total) by mouth 4 (four) times daily as needed for diarrhea or loose stools.   ONDANSETRON (ZOFRAN ODT) 4 MG DISINTEGRATING TABLET    Take 1 tablet (4 mg total) by mouth every 8 (eight) hours as needed for nausea or vomiting.     Barrett Henle, PA-C 12/10/16 1027    Derwood Kaplan, MD 12/11/16 [phone]

## 2016-12-10 NOTE — ED Notes (Signed)
ED Provider at bedside. 

## 2016-12-10 NOTE — ED Triage Notes (Signed)
Pt reports having vomiting and diarrhea that started yesterday morning at 1000. Pt reports 4 episodes of vomiting and 6 episodes of diarrhea. Pt reports cramping throughout stomach.

## 2016-12-10 NOTE — Discharge Instructions (Signed)
Take your medications as prescribed as needed for nausea, vomiting or diarrhea. Continue to keep fluids at home term and hydrated. I recommended eating a bland diet for the next few days until her symptoms have improved. Follow-up with your primary care provider the next 3-4 days if you have not had improvement of symptoms. Return to the emergency department if symptoms worsen or new onset of fever, chest pain, difficulty breathing, new/worsening abdominal pain, vomiting blood, unable to keep fluids down, blood in stool.

## 2017-04-20 ENCOUNTER — Emergency Department (HOSPITAL_COMMUNITY): Payer: Medicaid Other

## 2017-04-20 ENCOUNTER — Encounter (HOSPITAL_COMMUNITY): Payer: Self-pay

## 2017-04-20 ENCOUNTER — Emergency Department (HOSPITAL_COMMUNITY)
Admission: EM | Admit: 2017-04-20 | Discharge: 2017-04-21 | Disposition: A | Discharge status: Home / Self Care | Payer: Medicaid Other | Attending: Emergency Medicine | Admitting: Emergency Medicine

## 2017-04-20 DIAGNOSIS — J4521 Mild intermittent asthma with (acute) exacerbation: Secondary | ICD-10-CM | POA: Diagnosis not present

## 2017-04-20 DIAGNOSIS — J069 Acute upper respiratory infection, unspecified: Secondary | ICD-10-CM | POA: Diagnosis not present

## 2017-04-20 DIAGNOSIS — F17210 Nicotine dependence, cigarettes, uncomplicated: Secondary | ICD-10-CM | POA: Insufficient documentation

## 2017-04-20 DIAGNOSIS — Z91040 Latex allergy status: Secondary | ICD-10-CM | POA: Diagnosis not present

## 2017-04-20 DIAGNOSIS — R05 Cough: Secondary | ICD-10-CM | POA: Diagnosis present

## 2017-04-20 DIAGNOSIS — Z79899 Other long term (current) drug therapy: Secondary | ICD-10-CM | POA: Insufficient documentation

## 2017-04-20 DIAGNOSIS — J45909 Unspecified asthma, uncomplicated: Secondary | ICD-10-CM | POA: Diagnosis not present

## 2017-04-20 DIAGNOSIS — B9789 Other viral agents as the cause of diseases classified elsewhere: Secondary | ICD-10-CM

## 2017-04-20 MED ORDER — IPRATROPIUM-ALBUTEROL 0.5-2.5 (3) MG/3ML IN SOLN
3.0000 mL | Freq: Once | RESPIRATORY_TRACT | Status: AC
  Administered 2017-04-20: 3 mL via RESPIRATORY_TRACT
  Filled 2017-04-20: qty 3

## 2017-04-20 MED ORDER — ALBUTEROL SULFATE (2.5 MG/3ML) 0.083% IN NEBU
5.0000 mg | INHALATION_SOLUTION | Freq: Once | RESPIRATORY_TRACT | Status: AC
  Administered 2017-04-20: 5 mg via RESPIRATORY_TRACT
  Filled 2017-04-20: qty 6

## 2017-04-20 MED ORDER — ACETAMINOPHEN 500 MG PO TABS
1000.0000 mg | ORAL_TABLET | Freq: Once | ORAL | Status: AC
  Administered 2017-04-20: 1000 mg via ORAL
  Filled 2017-04-20: qty 2

## 2017-04-20 MED ORDER — PREDNISONE 20 MG PO TABS
60.0000 mg | ORAL_TABLET | Freq: Once | ORAL | Status: AC
  Administered 2017-04-20: 60 mg via ORAL
  Filled 2017-04-20: qty 3

## 2017-04-20 NOTE — ED Triage Notes (Signed)
States since yesterday and with cough with greenish secretions no fever states chills chest hurts with coughing and movement and deep inspiration.

## 2017-04-20 NOTE — ED Provider Notes (Signed)
[facility]-EMERGENCY DEPT Provider Note   CSN: [id number] Arrival date & time: 04/20/17  2032     History   Chief Complaint Chief Complaint  Patient presents with  . Cough    HPI Cassandra Harrison is a 26 y.o. female with history of asthma and chronic back pain who presents today with chief complaint acute onset, possibly worsening cough, nasal congestion, chest pain, and shortness of breath for 2 days.  States that chest pain is intermittent, exacerbated with movement and deep inspiration.  Pain is also present in her upper back.  Worsens with movement.  Pain is described as a sore aching pain.  Associated symptoms include nasal congestion and cough productive of sputum (patient states she is unsure of color because "it grosses me out").  Endorses throbbing frontal headache and intermittent nausea.  Denies sore throat, fever, chills, or vomiting.  She has tried her albuterol inhaler for her shortness of breath, states she is used it at least 6 times in the past 2 days.  States only temporary relief with her inhaler.  Has also tried Mucinex without significant relief.  She is a current smoker of approximately half a pack of cigarettes daily.  States "every time I get sick it makes my asthma and bronchitis worse ".She received a breathing treatment prior to my assessment and states it was mildly helpful.   The history is provided by the patient.    Past Medical History:  Diagnosis Date  . Asthma    last used 11/29/11  . Bulging disc   . Chronic back pain   . Constipation   . Migraine     There are no active problems to display for this patient.   Past Surgical History:  Procedure Laterality Date  . NO PAST SURGERIES      OB History    Gravida Para Term Preterm AB Living   1 1 1  0 0 1   SAB TAB Ectopic Multiple Live Births   0 0 0 0 1       Home Medications    Prior to Admission medications   Medication Sig Start Date End Date Taking? Authorizing  Provider  albuterol (PROVENTIL HFA;VENTOLIN HFA) 108 (90 BASE) MCG/ACT inhaler Inhale 2 puffs into the lungs every 4 (four) hours as needed for wheezing or shortness of breath. 03/09/14   Eber HongMiller, Brian, MD  benzonatate (TESSALON) 100 MG capsule Take 1 capsule (100 mg total) every 8 (eight) hours by mouth. 04/20/17   Luevenia MaxinFawze, Sarah-Jane Nazario A, PA-C  Cetirizine HCl (ZYRTEC ALLERGY) 10 MG CAPS Take 10 mg by mouth daily.     [provider]  dicyclomine (BENTYL) 20 MG tablet Take 1 tablet (20 mg total) by mouth 2 (two) times daily. 12/10/16   Barrett HenleNadeau, Nicole Elizabeth, PA-C  fluticasone (FLONASE) 50 MCG/ACT nasal spray Place 2 sprays daily into both nostrils. 04/20/17   Myka Lukins A, PA-C  loperamide (IMODIUM) 2 MG capsule Take 1 capsule (2 mg total) by mouth 4 (four) times daily as needed for diarrhea or loose stools. 12/10/16   Barrett HenleNadeau, Nicole Elizabeth, PA-C  metroNIDAZOLE (FLAGYL) 500 MG tablet Take 1 tablet (500 mg total) by mouth 2 (two) times daily. Patient not taking: Reported on 12/10/2016 10/03/16   Brock BadHarper, Charles A, MD  ondansetron (ZOFRAN ODT) 4 MG disintegrating tablet Take 1 tablet (4 mg total) by mouth every 8 (eight) hours as needed for nausea or vomiting. 12/10/16   Barrett HenleNadeau, Nicole Elizabeth, PA-C  predniSONE (  DELTASONE) 10 MG tablet Take 2 tablets (20 mg total) daily with breakfast for 5 days by mouth. 04/20/17 04/25/17  Jeanie Sewer, PA-C    Family History Family History  Problem Relation Age of Onset  . Diabetes Mother   . COPD Mother   . Heart disease Mother   . COPD Father   . Heart disease Father   . Cancer Sister   . Cancer Paternal Aunt   . Diabetes Maternal Grandmother   . Cancer Maternal Grandmother   . COPD Paternal Grandmother   . Heart disease Paternal Grandfather   . Other Neg Hx     Social History Social History   Tobacco Use  . Smoking status: Current Every Day Smoker    Packs/day: 0.25  . Smokeless tobacco: Never Used  Substance Use Topics  . Alcohol use: No  .  Drug use: No     Allergies   Olive oil and Latex   Review of Systems Review of Systems  Constitutional: Negative for chills and fever.  HENT: Positive for congestion. Negative for sore throat.   Respiratory: Positive for cough, chest tightness and shortness of breath.   Cardiovascular: Positive for chest pain.  Gastrointestinal: Positive for nausea. Negative for abdominal pain and vomiting.  Musculoskeletal: Positive for back pain.  Neurological: Positive for headaches.  All other systems reviewed and are negative.    Physical Exam Updated Vital Signs BP 115/71 (BP Location: Left Arm)   Pulse (!) 110   Temp 98.2 F (36.8 C) (Oral)   Resp 18   Ht 5\' 2"  (1.575 m)   Wt 107 kg (236 lb)   LMP 04/19/2017   SpO2 94%   BMI 43.16 kg/m   Physical Exam  Constitutional: She is oriented to person, place, and time. She appears well-developed and well-nourished. No distress.  HENT:  Head: Normocephalic and atraumatic.  Right Ear: External ear normal.  Left Ear: External ear normal.  Eyes: Conjunctivae are normal. Right eye exhibits no discharge. Left eye exhibits no discharge.  Neck: Normal range of motion. Neck supple. No JVD present. No tracheal deviation present.  No meningeal signs  Cardiovascular: Regular rhythm, normal heart sounds and intact distal pulses.  Tachycardic, 2+ radial and DP/PT pulses bl, negative Homan's bl/no calf pain   Pulmonary/Chest: Effort normal. She has wheezes. She exhibits tenderness.  Diffuse expiratory wheezing, generally diminished breath sounds.  Equal rise and fall of chest, speaking in full sentences without difficulty.  SPO2 95% on room air.  Diffuse anterior and posterior chest wall tenderness without deformity, crepitus, ecchymosis, or paradoxical wall motion  Abdominal: Soft. Bowel sounds are normal. She exhibits no distension. There is no tenderness.  Musculoskeletal: Normal range of motion. She exhibits tenderness. She exhibits no edema.    No midline spine TTP, bilateral parathoracic muscle tenderness.  No deformity, crepitus, or step-off noted  Neurological: She is alert and oriented to person, place, and time. No sensory deficit. She exhibits normal muscle tone.  Skin: Skin is warm and dry. No erythema.  Psychiatric: She has a normal mood and affect. Her behavior is normal.  Nursing note and vitals reviewed.    ED Treatments / Results  Labs (all labs ordered are listed, but only abnormal results are displayed) Labs Reviewed - No data to display  EKG  EKG Interpretation None       Radiology Dg Chest 2 View  Result Date: 04/20/2017 CLINICAL DATA:  Short of breath EXAM: CHEST  2 VIEW COMPARISON:  03/09/2014 FINDINGS: The heart size and mediastinal contours are within normal limits. Both lungs are clear. The visualized skeletal structures are unremarkable. IMPRESSION: No active cardiopulmonary disease. Electronically Signed   By: Jasmine PangKim  Fujinaga M.D.   On: 04/20/2017 21:29    Procedures Procedures (including critical care time)  Medications Ordered in ED Medications  albuterol (PROVENTIL) (2.5 MG/3ML) 0.083% nebulizer solution 5 mg (5 mg Nebulization Given 04/20/17 2057)  ipratropium-albuterol (DUONEB) 0.5-2.5 (3) MG/3ML nebulizer solution 3 mL (3 mLs Nebulization Given 04/20/17 2342)  predniSONE (DELTASONE) tablet 60 mg (60 mg Oral Given 04/20/17 2348)  acetaminophen (TYLENOL) tablet 1,000 mg (1,000 mg Oral Given 04/20/17 2358)     Initial Impression / Assessment and Plan / ED Course  I have reviewed the triage vital signs and the nursing notes.  Pertinent labs & imaging results that were available during my care of the patient were reviewed by me and considered in my medical decision making (see chart for details).     Patient with cough, shortness of breath, chest pain, and nasal congestion for 2 days.  Afebrile, SPO2 94-99% on room air while in but with improvement while in the ED.  I suspect this is  related to her continuous albuterol use.  She also received DuoNeb prior to my assessment.  Chest x-ray shows no acute cardiac pulmonary abnormality.  Chest pain is reproducible on palpation.  EKG shows sinus tachycardia but no evidence of ST segment abnormality.  I doubt ACS or MI, suspect more likely musculoskeletal in etiology due to persistent cough.  She is low risk for PE per Wells' criteria.  Wheezing improved significantly after administration of a second DuoNeb as well as prednisone.  She states she is feeling much better.  Symptoms consistent with viral URI with cough precipitating asthma exacerbation.  No evidence of pneumonia, pleural effusion, myositis, pericarditis, or other acute cardiopulmonary abnormality.  She stable for discharge home with symptomatic treatment as well as prednisone pulse.  Discussed strict ED return precautions. Pt verbalized understanding of and agreement with plan and is safe for discharge home at this time.  She has no complete prior to discharge.  Final Clinical Impressions(s) / ED Diagnoses   Final diagnoses:  Viral URI with cough  Mild intermittent asthma with exacerbation    ED Discharge Orders        Ordered    predniSONE (DELTASONE) 10 MG tablet  Daily with breakfast     04/21/17 0000    fluticasone (FLONASE) 50 MCG/ACT nasal spray  Daily     04/21/17 0000    benzonatate (TESSALON) 100 MG capsule  Every 8 hours     04/21/17 0000      Jeanie SewerFawze, Adarrius Graeff A, PA-C 04/21/17 1527  Shon BatonHorton, Courtney F, MD 04/22/17 [phone]

## 2017-04-21 MED ORDER — FLUTICASONE PROPIONATE 50 MCG/ACT NA SUSP: 2.0000 | Freq: Every day | NASAL | 0 refills | Status: DC

## 2017-04-21 MED ORDER — PREDNISONE 10 MG PO TABS: 20.0000 mg | ORAL_TABLET | Freq: Every day | ORAL | 0 refills | Status: AC

## 2017-04-21 MED ORDER — BENZONATATE 100 MG PO CAPS: 100.0000 mg | ORAL_CAPSULE | Freq: Three times a day (TID) | ORAL | 0 refills | Status: DC

## 2017-04-21 NOTE — Discharge Instructions (Signed)
Take prednisone as prescribed beginning tomorrow.  You received the first dose today in the emergency department.  Take Flonase for nasal congestion.  Take Tessalon for cough.  Use your albuterol inhaler as needed for shortness of breath.  Drink plenty of fluids and get plenty of rest.   Alternate 600 mg of ibuprofen and [phone] mg of Tylenol every 3 hours as needed for pain. Do not exceed 4000 mg of Tylenol daily. Follow-up with a primary care physician for reevaluation of your symptoms.  Return to the ED immediately for any concerning signs or symptoms develop such as worsening shortness of breath, worsening chest pain, fevers, etc.

## 2017-10-21 ENCOUNTER — Encounter (HOSPITAL_BASED_OUTPATIENT_CLINIC_OR_DEPARTMENT_OTHER): Payer: Self-pay

## 2017-10-21 ENCOUNTER — Other Ambulatory Visit: Payer: Self-pay

## 2017-10-21 ENCOUNTER — Emergency Department (HOSPITAL_BASED_OUTPATIENT_CLINIC_OR_DEPARTMENT_OTHER): Payer: Medicaid Other

## 2017-10-21 ENCOUNTER — Emergency Department (HOSPITAL_BASED_OUTPATIENT_CLINIC_OR_DEPARTMENT_OTHER)
Admission: EM | Admit: 2017-10-21 | Discharge: 2017-10-21 | Disposition: A | Discharge status: Home / Self Care | Payer: Medicaid Other | Attending: Emergency Medicine | Admitting: Emergency Medicine

## 2017-10-21 DIAGNOSIS — J45909 Unspecified asthma, uncomplicated: Secondary | ICD-10-CM | POA: Diagnosis not present

## 2017-10-21 DIAGNOSIS — J189 Pneumonia, unspecified organism: Secondary | ICD-10-CM

## 2017-10-21 DIAGNOSIS — R05 Cough: Secondary | ICD-10-CM | POA: Diagnosis present

## 2017-10-21 DIAGNOSIS — Z79899 Other long term (current) drug therapy: Secondary | ICD-10-CM | POA: Insufficient documentation

## 2017-10-21 DIAGNOSIS — J181 Lobar pneumonia, unspecified organism: Secondary | ICD-10-CM | POA: Diagnosis not present

## 2017-10-21 DIAGNOSIS — F17200 Nicotine dependence, unspecified, uncomplicated: Secondary | ICD-10-CM | POA: Diagnosis not present

## 2017-10-21 LAB — PREGNANCY, URINE: PREG TEST UR: NEGATIVE

## 2017-10-21 MED ORDER — IPRATROPIUM-ALBUTEROL 0.5-2.5 (3) MG/3ML IN SOLN
RESPIRATORY_TRACT | Status: AC
  Administered 2017-10-21: 3 mL
  Filled 2017-10-21: qty 3

## 2017-10-21 MED ORDER — METHYLPREDNISOLONE SODIUM SUCC 125 MG IJ SOLR
125.0000 mg | Freq: Once | INTRAMUSCULAR | Status: DC
  Filled 2017-10-21: qty 2

## 2017-10-21 MED ORDER — ALBUTEROL SULFATE HFA 108 (90 BASE) MCG/ACT IN AERS: 1.0000 | INHALATION_SPRAY | Freq: Four times a day (QID) | RESPIRATORY_TRACT | 2 refills | Status: DC | PRN

## 2017-10-21 MED ORDER — DOXYCYCLINE HYCLATE 100 MG PO CAPS: 100.0000 mg | ORAL_CAPSULE | Freq: Two times a day (BID) | ORAL | 0 refills | Status: AC

## 2017-10-21 MED ORDER — ALBUTEROL SULFATE (2.5 MG/3ML) 0.083% IN NEBU
2.5000 mg | INHALATION_SOLUTION | Freq: Once | RESPIRATORY_TRACT | Status: AC
  Administered 2017-10-21: 2.5 mg via RESPIRATORY_TRACT
  Filled 2017-10-21: qty 3

## 2017-10-21 MED ORDER — BENZONATATE 100 MG PO CAPS: 100.0000 mg | ORAL_CAPSULE | Freq: Three times a day (TID) | ORAL | 0 refills | Status: DC

## 2017-10-21 MED ORDER — METHYLPREDNISOLONE SODIUM SUCC 125 MG IJ SOLR
125.0000 mg | Freq: Once | INTRAMUSCULAR | Status: AC
Start: 2017-10-21 — End: 2017-10-21
  Administered 2017-10-21: 125 mg via INTRAMUSCULAR

## 2017-10-21 MED ORDER — PREDNISONE 20 MG PO TABS: 40.0000 mg | ORAL_TABLET | Freq: Every day | ORAL | 0 refills | Status: AC

## 2017-10-21 MED ORDER — IPRATROPIUM-ALBUTEROL 0.5-2.5 (3) MG/3ML IN SOLN
3.0000 mL | Freq: Four times a day (QID) | RESPIRATORY_TRACT | Status: DC
  Administered 2017-10-21: 3 mL via RESPIRATORY_TRACT
  Filled 2017-10-21: qty 3

## 2017-10-21 NOTE — ED Triage Notes (Addendum)
C/o flu like sx x 3 days with chest and back pain-states last used albuterol inhaler yesterday-NAD-steady gait

## 2017-10-21 NOTE — Discharge Instructions (Signed)
Take antibiotics as directed. Please take all of your antibiotics until finished.  Take prednisone as directed.   Use albuterol inhaler as directed.   Take Tessalon perles as directed.   Follow-up with your primary care doctor in the next 2 to 4 days for further evaluation.  Return the emergency department for any worsening cough, fever, difficulty breathing, wheezing or any other worsening concerning symptoms.

## 2017-10-21 NOTE — ED Notes (Signed)
Pt already ambulated by RRT Boneta Lucks

## 2017-10-21 NOTE — ED Notes (Signed)
Ambulated patient via physician order. Patient resting oxygen saturations were 91% with heart rate of 102. Patient walked with a normal gait maintaining oxygen saturations of 91%-96% and heart rate of 102-110. Patient tolerated well and returned to room.

## 2017-10-21 NOTE — ED Notes (Signed)
DuoNeb given in triage. Tolerated well remains, congested and wheezing L>R.

## 2017-10-21 NOTE — ED Provider Notes (Signed)
[facility] EMERGENCY DEPARTMENT Provider Note   CSN: [id number] Arrival date & time: 10/21/17  1624     History   Chief Complaint Chief Complaint  Patient presents with  . Cough    HPI Cassandra Harrison is a 27 y.o. female past medical history of asthma who presents for evaluation of cough, nasal congestion, wheezing x 3 days.  Patient reports that she has been febrile at 100.8 over the last few days.  Reports her last fever was yesterday.  Patient reports cough is productive with white sputum.  Patient reports that she has a history of asthma and felt like she has been wheezing over the last 24 hours.  Patient reports that she does not have her inhaler and she ran out of prescriptions.  Patient states that she has had generalized body aches.  Patient denies any chest pain, abdominal pain, vomiting, leg swelling.  The history is provided by the patient.    Past Medical History:  Diagnosis Date  . Asthma    last used 11/29/11  . Bulging disc   . Chronic back pain   . Constipation   . Migraine     There are no active problems to display for this patient.   Past Surgical History:  Procedure Laterality Date  . NO PAST SURGERIES       OB History    Gravida  1   Para  1   Term  1   Preterm  0   AB  0   Living  1     SAB  0   TAB  0   Ectopic  0   Multiple  0   Live Births  1            Home Medications    Prior to Admission medications   Medication Sig Start Date End Date Taking? Authorizing Provider  albuterol (PROVENTIL HFA;VENTOLIN HFA) 108 (90 Base) MCG/ACT inhaler Inhale 1-2 puffs into the lungs every 6 (six) hours as needed for wheezing or shortness of breath. 10/21/17   Maxwell Caul, PA-C  benzonatate (TESSALON) 100 MG capsule Take 1 capsule (100 mg total) by mouth every 8 (eight) hours. 10/21/17   Maxwell Caul, PA-C  Cetirizine HCl (ZYRTEC ALLERGY) 10 MG CAPS Take 10 mg by mouth daily.     [provider]    dicyclomine (BENTYL) 20 MG tablet Take 1 tablet (20 mg total) by mouth 2 (two) times daily. 12/10/16   Barrett Henle, PA-C  doxycycline (VIBRAMYCIN) 100 MG capsule Take 1 capsule (100 mg total) by mouth 2 (two) times daily for 7 days. 10/21/17 10/28/17  Maxwell Caul, PA-C  fluticasone (FLONASE) 50 MCG/ACT nasal spray Place 2 sprays daily into both nostrils. 04/20/17   Fawze, Mina A, PA-C  loperamide (IMODIUM) 2 MG capsule Take 1 capsule (2 mg total) by mouth 4 (four) times daily as needed for diarrhea or loose stools. 12/10/16   Barrett Henle, PA-C  metroNIDAZOLE (FLAGYL) 500 MG tablet Take 1 tablet (500 mg total) by mouth 2 (two) times daily. Patient not taking: Reported on 12/10/2016 10/03/16   Brock Bad, MD  ondansetron (ZOFRAN ODT) 4 MG disintegrating tablet Take 1 tablet (4 mg total) by mouth every 8 (eight) hours as needed for nausea or vomiting. 12/10/16   Barrett Henle, PA-C  predniSONE (DELTASONE) 20 MG tablet Take 2 tablets (40 mg total) by mouth daily for 4 days. 10/21/17 10/25/17  Layden,  Early Chars PA-C    Family History Family History  Problem Relation Age of Onset  . Diabetes Mother   . COPD Mother   . Heart disease Mother   . COPD Father   . Heart disease Father   . Cancer Sister   . Cancer Paternal Aunt   . Diabetes Maternal Grandmother   . Cancer Maternal Grandmother   . COPD Paternal Grandmother   . Heart disease Paternal Grandfather   . Other Neg Hx     Social History Social History   Tobacco Use  . Smoking status: Current Every Day Smoker    Packs/day: 0.25  . Smokeless tobacco: Never Used  Substance Use Topics  . Alcohol use: Yes    Comment: occ  . Drug use: No     Allergies   Olive oil and Latex   Review of Systems Review of Systems  Constitutional: Positive for fatigue and fever.  HENT: Positive for congestion and rhinorrhea.   Respiratory: Positive for cough. Negative for shortness of breath.    Cardiovascular: Negative for chest pain.  Gastrointestinal: Negative for abdominal pain, nausea and vomiting.  Genitourinary: Negative for dysuria and hematuria.  Neurological: Negative for headaches.     Physical Exam Updated Vital Signs BP 117/69 (BP Location: Right Arm)   Pulse 84   Temp 98.7 F (37.1 C) (Oral)   Resp 18   Ht 5' 2.25" (1.581 m)   Wt 104.3 kg (230 lb)   LMP  (LMP Unknown)   SpO2 99%   BMI 41.73 kg/m   Physical Exam  Constitutional: She is oriented to person, place, and time. She appears well-developed and well-nourished.  HENT:  Head: Normocephalic and atraumatic.  Nose: Mucosal edema present.  Mouth/Throat: Oropharynx is clear and moist and mucous membranes are normal.  Eyes: Pupils are equal, round, and reactive to light. Conjunctivae, EOM and lids are normal.  Neck: Full passive range of motion without pain.  Cardiovascular: Normal rate, regular rhythm, normal heart sounds and normal pulses. Exam reveals no gallop and no friction rub.  No murmur heard. Pulmonary/Chest: Effort normal. She has wheezes. She has rhonchi.  Diffuse wheezing noted throughout all lung fields.  Rhonchi noted on the left side.  Diffuse rales noted also.  Able speak in full sentences without any difficulty.  Abdominal: Soft. Normal appearance. There is no tenderness. There is no rigidity and no guarding.  Musculoskeletal: Normal range of motion.  Neurological: She is alert and oriented to person, place, and time.  Skin: Skin is warm and dry. Capillary refill takes less than 2 seconds.  Psychiatric: She has a normal mood and affect. Her speech is normal.  Nursing note and vitals reviewed.    ED Treatments / Results  Labs (all labs ordered are listed, but only abnormal results are displayed) Labs Reviewed  PREGNANCY, URINE    EKG None  Radiology Dg Chest 2 View  Result Date: 10/21/2017 CLINICAL DATA:  Cough, fever, chills and sore throat over the last 3 days. EXAM:  CHEST - 2 VIEW COMPARISON:  04/20/2017 FINDINGS: Heart and mediastinal shadows are normal. There is mild patchy infiltrate in the lingula, with indistinctness of the left heart border. No dense consolidation or lobar collapse. No effusions. No significant bone finding. IMPRESSION: Mild patchy lingular pneumonia. Electronically Signed   By: Paulina Fusi M.D.   On: 10/21/2017 19:20    Procedures Procedures (including critical care time)  Medications Ordered in ED Medications  ipratropium-albuterol (DUONEB) 0.5-2.5 (3)  MG/3ML nebulizer solution (3 mLs  Given 10/21/17 1649)  methylPREDNISolone sodium succinate (SOLU-MEDROL) 125 mg/2 mL injection 125 mg (125 mg Intramuscular Given 10/21/17 1946)  albuterol (PROVENTIL) (2.5 MG/3ML) 0.083% nebulizer solution 2.5 mg (2.5 mg Nebulization Given 10/21/17 2014)     Initial Impression / Assessment and Plan / ED Course  I have reviewed the triage vital signs and the nursing notes.  Pertinent labs & imaging results that were available during my care of the patient were reviewed by me and considered in my medical decision making (see chart for details).     27 year old female with past medical history of asthma who presents for evaluation of 3 days of cough, nasal congestion, rhinorrhea, fever.  Patient reports associated generalized myalgias, fatigue.  Denies any vomiting.  Has a history of asthma and states she felt like she has been wheezing over the last few days.  States she ran out of her inhalers and does not have refill prescriptions.  On initial ED arrival, patient is afebrile.  O2 sats are 96% on room air.  No evidence of respiratory distress.  On exam, patient has diffuse wheezing noted throughout.  Rales noted to the lower lung fields.  Initial nebulizer given in triage.  Will plan for additional nebulizer treatment, steroids.  Will evaluate chest x-ray.  Chest x-ray reviewed.  X-ray shows evidence of patchy lingular pneumonia on the left side.   Updated patient on results.  Reevaluation after nebulizer treatment.  Patient still with some mild wheezing.  Will give additional albuterol inhaler.  Reevaluation.  Patient reports improvement in symptoms.  Lungs show improvement in wheezing.  O2 sats are stable at 95% on room air.  Patient ambulated and O2 sat stayed between 91 to 96% on room air.  We will plan to treat with antibiotic therapy.  Plan to provide steroids, Tessalon Perles for supportive therapy relief.  Patient instructed to follow-up with primary care doctor as directed. Patient had ample opportunity for questions and discussion. All patient's questions were answered with full understanding. Strict return precautions discussed. Patient expresses understanding and agreement to plan.   Final Clinical Impressions(s) / ED Diagnoses   Final diagnoses:  Community acquired pneumonia of left lung, unspecified part of lung    ED Discharge Orders        Ordered    doxycycline (VIBRAMYCIN) 100 MG capsule  2 times daily     10/21/17 2052    albuterol (PROVENTIL HFA;VENTOLIN HFA) 108 (90 Base) MCG/ACT inhaler  Every 6 hours PRN     10/21/17 2052    predniSONE (DELTASONE) 20 MG tablet  Daily     10/21/17 2052    benzonatate (TESSALON) 100 MG capsule  Every 8 hours     10/21/17 2052       Maxwell Caul, PA-C 10/22/17 1705    Shaune Pollack, MD 10/23/17 1013

## 2017-10-21 NOTE — ED Notes (Signed)
Patient transported to X-ray 

## 2018-06-09 ENCOUNTER — Encounter (HOSPITAL_COMMUNITY): Payer: Self-pay

## 2018-06-09 ENCOUNTER — Emergency Department (HOSPITAL_COMMUNITY)
Admission: EM | Admit: 2018-06-09 | Discharge: 2018-06-10 | Disposition: A | Discharge status: Home / Self Care | Payer: Medicaid Other | Attending: Emergency Medicine | Admitting: Emergency Medicine

## 2018-06-09 ENCOUNTER — Other Ambulatory Visit: Payer: Self-pay

## 2018-06-09 DIAGNOSIS — J45909 Unspecified asthma, uncomplicated: Secondary | ICD-10-CM | POA: Insufficient documentation

## 2018-06-09 DIAGNOSIS — J029 Acute pharyngitis, unspecified: Secondary | ICD-10-CM | POA: Diagnosis present

## 2018-06-09 DIAGNOSIS — Z91040 Latex allergy status: Secondary | ICD-10-CM | POA: Insufficient documentation

## 2018-06-09 DIAGNOSIS — Z79899 Other long term (current) drug therapy: Secondary | ICD-10-CM | POA: Insufficient documentation

## 2018-06-09 DIAGNOSIS — F17200 Nicotine dependence, unspecified, uncomplicated: Secondary | ICD-10-CM | POA: Insufficient documentation

## 2018-06-09 DIAGNOSIS — R05 Cough: Secondary | ICD-10-CM | POA: Insufficient documentation

## 2018-06-09 LAB — GROUP A STREP BY PCR: Group A Strep by PCR: NOT DETECTED

## 2018-06-09 NOTE — ED Triage Notes (Signed)
Pt here for sore throat for last 24 hrs.  No fevers but getting hard to sallow.  A&Ox4.

## 2018-06-10 MED ORDER — GUAIFENESIN ER 1200 MG PO TB12: 1.0000 | ORAL_TABLET | Freq: Two times a day (BID) | ORAL | 0 refills | Status: DC

## 2018-06-10 MED ORDER — PREDNISONE 50 MG PO TABS: 50.0000 mg | ORAL_TABLET | Freq: Every day | ORAL | 0 refills | Status: DC

## 2018-06-10 MED ORDER — ACETAMINOPHEN-CODEINE 120-12 MG/5ML PO SOLN: 10.0000 mL | ORAL | 0 refills | Status: DC | PRN

## 2018-06-10 NOTE — ED Notes (Signed)
Pt called for room x3 with no answer. 

## 2018-06-10 NOTE — ED Provider Notes (Signed)
[facility] EMERGENCY DEPARTMENT Provider Note   CSN: [id number] Arrival date & time: 06/09/18  2202     History   Chief Complaint Chief Complaint  Patient presents with  . Sore Throat    HPI Cassandra Harrison is a 27 y.o. female.  HPI Patient presents to the emergency department with sore throat that started 2 days ago.  Patient states that she is had some mild cough with that as well.  Denies any other symptoms.  She states she has felt feverish at home.  Patient denies chest pain, shortness of breath, nausea, vomiting, weakness, dizziness, back pain, neck pain rash, or syncope. Past Medical History:  Diagnosis Date  . Asthma    last used 11/29/11  . Bulging disc   . Chronic back pain   . Constipation   . Migraine     There are no active problems to display for this patient.   Past Surgical History:  Procedure Laterality Date  . NO PAST SURGERIES       OB History    Gravida  1   Para  1   Term  1   Preterm  0   AB  0   Living  1     SAB  0   TAB  0   Ectopic  0   Multiple  0   Live Births  1            Home Medications    Prior to Admission medications   Medication Sig Start Date End Date Taking? Authorizing Provider  acetaminophen-codeine 120-12 MG/5ML solution Take 10 mLs by mouth every 4 (four) hours as needed for moderate pain. 06/10/18   Charie Pinkus, Cristal Deer, PA-C  albuterol (PROVENTIL HFA;VENTOLIN HFA) 108 (90 Base) MCG/ACT inhaler Inhale 1-2 puffs into the lungs every 6 (six) hours as needed for wheezing or shortness of breath. 10/21/17   Maxwell Caul, PA-C  benzonatate (TESSALON) 100 MG capsule Take 1 capsule (100 mg total) by mouth every 8 (eight) hours. 10/21/17   Maxwell Caul, PA-C  Cetirizine HCl (ZYRTEC ALLERGY) 10 MG CAPS Take 10 mg by mouth daily.     [provider]  dicyclomine (BENTYL) 20 MG tablet Take 1 tablet (20 mg total) by mouth 2 (two) times daily. 12/10/16   Barrett Henle, PA-C  fluticasone (FLONASE) 50 MCG/ACT nasal spray Place 2 sprays daily into both nostrils. 04/20/17   Fawze, Mina A, PA-C  Guaifenesin 1200 MG TB12 Take 1 tablet (1,200 mg total) by mouth 2 (two) times daily. 06/10/18   Renelda Kilian, Cristal Deer, PA-C  loperamide (IMODIUM) 2 MG capsule Take 1 capsule (2 mg total) by mouth 4 (four) times daily as needed for diarrhea or loose stools. 12/10/16   Barrett Henle, PA-C  metroNIDAZOLE (FLAGYL) 500 MG tablet Take 1 tablet (500 mg total) by mouth 2 (two) times daily. Patient not taking: Reported on 12/10/2016 10/03/16   Brock Bad, MD  ondansetron (ZOFRAN ODT) 4 MG disintegrating tablet Take 1 tablet (4 mg total) by mouth every 8 (eight) hours as needed for nausea or vomiting. 12/10/16   Barrett Henle, PA-C  predniSONE (DELTASONE) 50 MG tablet Take 1 tablet (50 mg total) by mouth daily. 06/10/18   Charlestine Night, PA-C    Family History Family History  Problem Relation Age of Onset  . Diabetes Mother   . COPD Mother   . Heart disease Mother   . COPD Father   .  Heart disease Father   . Cancer Sister   . Cancer Paternal Aunt   . Diabetes Maternal Grandmother   . Cancer Maternal Grandmother   . COPD Paternal Grandmother   . Heart disease Paternal Grandfather   . Other Neg Hx     Social History Social History   Tobacco Use  . Smoking status: Current Every Day Smoker    Packs/day: 0.25  . Smokeless tobacco: Never Used  Substance Use Topics  . Alcohol use: Yes    Comment: occ  . Drug use: No     Allergies   Olive oil and Latex   Review of Systems Review of Systems All other systems negative except as documented in the HPI. All pertinent positives and negatives as reviewed in the HPI.  Physical Exam Updated Vital Signs BP 106/65 (BP Location: Left Arm)   Pulse 83   Temp 98 F (36.7 C) (Oral)   Resp 16   SpO2 98%   Physical Exam Vitals signs and nursing note reviewed.  Constitutional:       General: She is not in acute distress.    Appearance: She is well-developed.  HENT:     Head: Normocephalic and atraumatic.     Mouth/Throat:     Pharynx: No pharyngeal swelling, oropharyngeal exudate, posterior oropharyngeal erythema or uvula swelling.     Tonsils: No tonsillar exudate or tonsillar abscesses. Swelling: 1+ on the right. 1+ on the left.  Eyes:     Pupils: Pupils are equal, round, and reactive to light.  Neck:     Musculoskeletal: Normal range of motion and neck supple.  Cardiovascular:     Rate and Rhythm: Normal rate and regular rhythm.     Heart sounds: Normal heart sounds. No murmur. No friction rub. No gallop.   Pulmonary:     Effort: Pulmonary effort is normal. No respiratory distress.     Breath sounds: Normal breath sounds. No wheezing.  Abdominal:     General: Bowel sounds are normal. There is no distension.     Palpations: Abdomen is soft.     Tenderness: There is no abdominal tenderness.  Skin:    General: Skin is warm and dry.     Capillary Refill: Capillary refill takes less than 2 seconds.     Findings: No erythema or rash.  Neurological:     Mental Status: She is alert and oriented to person, place, and time.     Motor: No abnormal muscle tone.     Coordination: Coordination normal.  Psychiatric:        Behavior: Behavior normal.      ED Treatments / Results  Labs (all labs ordered are listed, but only abnormal results are displayed) Labs Reviewed  GROUP A STREP BY PCR    EKG None  Radiology No results found.  Procedures Procedures (including critical care time)  Medications Ordered in ED Medications - No data to display   Initial Impression / Assessment and Plan / ED Course  I have reviewed the triage vital signs and the nursing notes.  Pertinent labs & imaging results that were available during my care of the patient were reviewed by me and considered in my medical decision making (see chart for details).     Patient be  treated for pharyngitis.  I told her to return here for any worsening in her condition.  Told to increase her fluid intake and rest as much as possible.  Patient agrees the plan and all  questions were answered.  Final Clinical Impressions(s) / ED Diagnoses   Final diagnoses:  Pharyngitis, unspecified etiology    ED Discharge Orders         Ordered    predniSONE (DELTASONE) 50 MG tablet  Daily     06/10/18 0315    acetaminophen-codeine 120-12 MG/5ML solution  Every 4 hours PRN     06/10/18 0315    Guaifenesin 1200 MG TB12  2 times daily     06/10/18 0315           Charlestine NightLawyer, Jameisha Stofko, PA-C 06/10/18 0651    Ward, Layla MawKristen N, DO 06/10/18 [phone]

## 2018-06-10 NOTE — ED Notes (Signed)
PT states understanding of care given, follow up care, and medication prescribed. PT ambulated from ED to car with a steady gait. 

## 2018-06-10 NOTE — ED Notes (Signed)
Pt up to desk asking if she had been called. Pt told she will have to wait for another room because she was taken OTF.

## 2018-06-10 NOTE — Discharge Instructions (Addendum)
Return here as needed. Follow up with your doctor. °

## 2019-10-29 ENCOUNTER — Encounter (HOSPITAL_COMMUNITY): Payer: Self-pay | Admitting: *Deleted

## 2019-10-29 ENCOUNTER — Emergency Department (HOSPITAL_COMMUNITY)
Admission: EM | Admit: 2019-10-29 | Discharge: 2019-10-30 | Disposition: A | Discharge status: Home / Self Care | Payer: Medicaid Other | Attending: Emergency Medicine | Admitting: Emergency Medicine

## 2019-10-29 ENCOUNTER — Other Ambulatory Visit: Payer: Self-pay

## 2019-10-29 DIAGNOSIS — F17210 Nicotine dependence, cigarettes, uncomplicated: Secondary | ICD-10-CM | POA: Insufficient documentation

## 2019-10-29 DIAGNOSIS — Z91040 Latex allergy status: Secondary | ICD-10-CM | POA: Insufficient documentation

## 2019-10-29 DIAGNOSIS — Z3201 Encounter for pregnancy test, result positive: Secondary | ICD-10-CM | POA: Diagnosis not present

## 2019-10-29 DIAGNOSIS — R110 Nausea: Secondary | ICD-10-CM | POA: Diagnosis not present

## 2019-10-29 LAB — I-STAT BETA HCG BLOOD, ED (MC, WL, AP ONLY): I-stat hCG, quantitative: >2000 m[IU]/mL — ABNORMAL HIGH (ref <5)

## 2019-10-29 NOTE — Discharge Instructions (Signed)
Your pregnancy test was positive.  Please call OB/GYN above for an appointment.

## 2019-10-29 NOTE — ED Provider Notes (Signed)
[facility] EMERGENCY DEPARTMENT Provider Note   CSN: [id number] Arrival date & time: 10/29/19  2252     History Chief Complaint  Patient presents with  . Possible Pregnancy    Cassandra Harrison is a 29 y.o. female.  Patient is a 29 year old G3 patient whose last menstrual cycle it was April 12 presenting for a positive pregnancy test at home.  Patient reports that she is having some mild nausea but no other symptoms.  She denies any vaginal bleeding, vaginal discharge, abdominal pain.  She reports "I want to know how far along I am because I am Rh-".        Past Medical History:  Diagnosis Date  . Asthma    last used 11/29/11  . Bulging disc   . Chronic back pain   . Constipation   . Migraine     There are no problems to display for this patient.   Past Surgical History:  Procedure Laterality Date  . NO PAST SURGERIES       OB History    Gravida  1   Para  1   Term  1   Preterm  0   AB  0   Living  1     SAB  0   TAB  0   Ectopic  0   Multiple  0   Live Births  1           Family History  Problem Relation Age of Onset  . Diabetes Mother   . COPD Mother   . Heart disease Mother   . COPD Father   . Heart disease Father   . Cancer Sister   . Cancer Paternal Aunt   . Diabetes Maternal Grandmother   . Cancer Maternal Grandmother   . COPD Paternal Grandmother   . Heart disease Paternal Grandfather   . Other Neg Hx     Social History   Tobacco Use  . Smoking status: Current Every Day Smoker    Packs/day: 0.25  . Smokeless tobacco: Never Used  Substance Use Topics  . Alcohol use: Yes    Comment: occ  . Drug use: No    Home Medications Prior to Admission medications   Medication Sig Start Date End Date Taking? Authorizing Provider  acetaminophen-codeine 120-12 MG/5ML solution Take 10 mLs by mouth every 4 (four) hours as needed for moderate pain. 06/10/18   Lawyer, Cristal Deer, PA-C  albuterol (PROVENTIL  HFA;VENTOLIN HFA) 108 (90 Base) MCG/ACT inhaler Inhale 1-2 puffs into the lungs every 6 (six) hours as needed for wheezing or shortness of breath. 10/21/17   Maxwell Caul, PA-C  benzonatate (TESSALON) 100 MG capsule Take 1 capsule (100 mg total) by mouth every 8 (eight) hours. 10/21/17   Maxwell Caul, PA-C  Cetirizine HCl (ZYRTEC ALLERGY) 10 MG CAPS Take 10 mg by mouth daily.     [provider]  dicyclomine (BENTYL) 20 MG tablet Take 1 tablet (20 mg total) by mouth 2 (two) times daily. 12/10/16   Barrett Henle, PA-C  fluticasone (FLONASE) 50 MCG/ACT nasal spray Place 2 sprays daily into both nostrils. 04/20/17   Fawze, Mina A, PA-C  Guaifenesin 1200 MG TB12 Take 1 tablet (1,200 mg total) by mouth 2 (two) times daily. 06/10/18   Lawyer, Cristal Deer, PA-C  loperamide (IMODIUM) 2 MG capsule Take 1 capsule (2 mg total) by mouth 4 (four) times daily as needed for diarrhea or loose stools. 12/10/16  Barrett Henle, PA-C  metroNIDAZOLE (FLAGYL) 500 MG tablet Take 1 tablet (500 mg total) by mouth 2 (two) times daily. Patient not taking: Reported on 12/10/2016 10/03/16   Brock Bad, MD  ondansetron (ZOFRAN ODT) 4 MG disintegrating tablet Take 1 tablet (4 mg total) by mouth every 8 (eight) hours as needed for nausea or vomiting. 12/10/16   Barrett Henle, PA-C  predniSONE (DELTASONE) 50 MG tablet Take 1 tablet (50 mg total) by mouth daily. 06/10/18   Lawyer, Cristal Deer, PA-C    Allergies    Olive oil and Latex  Review of Systems   Review of Systems  Constitutional: Negative for chills and fever.  Gastrointestinal: Positive for nausea.  Genitourinary: Positive for menstrual problem. Negative for dysuria, vaginal bleeding, vaginal discharge and vaginal pain.  Neurological: Negative for dizziness.    Physical Exam Updated Vital Signs BP 124/74 (BP Location: Left Arm)   Pulse 80   Temp 98.6 F (37 C) (Oral)   Resp 16   LMP 09/21/2019   SpO2 98%    Physical Exam Vitals and nursing note reviewed.  Constitutional:      Appearance: Normal appearance.  HENT:     Head: Normocephalic.  Eyes:     Conjunctiva/sclera: Conjunctivae normal.  Pulmonary:     Effort: Pulmonary effort is normal.  Skin:    General: Skin is dry.  Neurological:     Mental Status: She is alert.  Psychiatric:        Mood and Affect: Mood normal.     ED Results / Procedures / Treatments   Labs (all labs ordered are listed, but only abnormal results are displayed) Labs Reviewed  I-STAT BETA HCG BLOOD, ED ([facility], [facility], [facility] ONLY) - Abnormal; Notable for the following components:      Result Value   I-stat hCG, quantitative >2,000.0 (*)    All other components within normal limits    EKG None  Radiology No results found.  Procedures Procedures (including critical care time)  Medications Ordered in ED Medications - No data to display  ED Course  I have reviewed the triage vital signs and the nursing notes.  Pertinent labs & imaging results that were available during my care of the patient were reviewed by me and considered in my medical decision making (see chart for details).  Clinical Course as of May 21 0000  Thu Oct 29, 2019  2359 Patient presenting to the ED for positive pregnancy test at home.  Has mild nausea but otherwise no symptoms, specifically no vaginal bleeding or vaginal discharge or abdominal pain.  I called over to [facility].  The recommendation was to have her call the [facility] center tomorrow for follow-up.  Given that she is not having any symptoms she needs no further intervention workup at this time.  They report that she will need Rh injections at week 28 but otherwise she can follow-up outpatient for now   [KM]    Clinical Course User Index [KM] Jeral Pinch   MDM Rules/Calculators/A&P                      Based on review of vitals, medical screening exam, lab work and/or imaging, there does not appear to be an acute,  emergent etiology for the patient's symptoms. Counseled pt on good return precautions and encouraged both PCP and ED follow-up as needed.  Prior to discharge, I also discussed incidental imaging findings with patient in detail and  advised appropriate, recommended follow-up in detail.  Clinical Impression: 1. Positive pregnancy test     Disposition: Discharge  Prior to providing a prescription for a controlled substance, I independently reviewed the patient's recent prescription history on the [facility]. The patient had no recent or regular prescriptions and was deemed appropriate for a brief, less than 3 day prescription of narcotic for acute analgesia.  This note was prepared with assistance of Systems analyst. Occasional wrong-word or sound-a-like substitutions may have occurred due to the inherent limitations of voice recognition software.  Final Clinical Impression(s) / ED Diagnoses Final diagnoses:  Positive pregnancy test    Rx / DC Orders ED Discharge Orders    None       Kristine Royal 10/30/19 0000    Palumbo, April, MD 10/30/19 0008

## 2019-10-29 NOTE — ED Triage Notes (Signed)
Pt says she would like to know if she is pregnant and how far along in her pregnancy she is. Her last period was April 12th. Took home pregnancy test and it was positive. Pt denies vaginal bleeding, abdominal pain or discharge.

## 2019-10-30 NOTE — ED Notes (Signed)
Patient verbalizes understanding of discharge instructions. Opportunity for questioning and answers were provided. Armband removed by staff, pt discharged from ED.  

## 2019-12-10 ENCOUNTER — Other Ambulatory Visit: Payer: Self-pay

## 2019-12-10 ENCOUNTER — Encounter (HOSPITAL_COMMUNITY): Payer: Self-pay | Admitting: Obstetrics and Gynecology

## 2019-12-10 ENCOUNTER — Inpatient Hospital Stay (HOSPITAL_COMMUNITY)
Admission: AD | Admit: 2019-12-10 | Discharge: 2019-12-10 | Disposition: A | Discharge status: Home / Self Care | Payer: Medicaid Other | Attending: Obstetrics and Gynecology | Admitting: Obstetrics and Gynecology

## 2019-12-10 ENCOUNTER — Inpatient Hospital Stay (HOSPITAL_COMMUNITY): Payer: Medicaid Other

## 2019-12-10 DIAGNOSIS — Z8249 Family history of ischemic heart disease and other diseases of the circulatory system: Secondary | ICD-10-CM | POA: Insufficient documentation

## 2019-12-10 DIAGNOSIS — Z833 Family history of diabetes mellitus: Secondary | ICD-10-CM | POA: Diagnosis not present

## 2019-12-10 DIAGNOSIS — R300 Dysuria: Secondary | ICD-10-CM | POA: Insufficient documentation

## 2019-12-10 DIAGNOSIS — O208 Other hemorrhage in early pregnancy: Secondary | ICD-10-CM | POA: Insufficient documentation

## 2019-12-10 DIAGNOSIS — Z3A11 11 weeks gestation of pregnancy: Secondary | ICD-10-CM

## 2019-12-10 DIAGNOSIS — O209 Hemorrhage in early pregnancy, unspecified: Secondary | ICD-10-CM

## 2019-12-10 DIAGNOSIS — F17210 Nicotine dependence, cigarettes, uncomplicated: Secondary | ICD-10-CM | POA: Insufficient documentation

## 2019-12-10 DIAGNOSIS — Z87440 Personal history of urinary (tract) infections: Secondary | ICD-10-CM | POA: Insufficient documentation

## 2019-12-10 DIAGNOSIS — Z91040 Latex allergy status: Secondary | ICD-10-CM | POA: Diagnosis not present

## 2019-12-10 DIAGNOSIS — O4690 Antepartum hemorrhage, unspecified, unspecified trimester: Secondary | ICD-10-CM

## 2019-12-10 DIAGNOSIS — O99331 Smoking (tobacco) complicating pregnancy, first trimester: Secondary | ICD-10-CM | POA: Diagnosis not present

## 2019-12-10 DIAGNOSIS — O26892 Other specified pregnancy related conditions, second trimester: Secondary | ICD-10-CM | POA: Diagnosis not present

## 2019-12-10 DIAGNOSIS — O26891 Other specified pregnancy related conditions, first trimester: Secondary | ICD-10-CM | POA: Diagnosis not present

## 2019-12-10 DIAGNOSIS — Z3490 Encounter for supervision of normal pregnancy, unspecified, unspecified trimester: Secondary | ICD-10-CM

## 2019-12-10 DIAGNOSIS — Z825 Family history of asthma and other chronic lower respiratory diseases: Secondary | ICD-10-CM | POA: Diagnosis not present

## 2019-12-10 DIAGNOSIS — Z6791 Unspecified blood type, Rh negative: Secondary | ICD-10-CM | POA: Diagnosis not present

## 2019-12-10 DIAGNOSIS — R350 Frequency of micturition: Secondary | ICD-10-CM

## 2019-12-10 DIAGNOSIS — Z3A12 12 weeks gestation of pregnancy: Secondary | ICD-10-CM | POA: Diagnosis not present

## 2019-12-10 DIAGNOSIS — R102 Pelvic and perineal pain: Secondary | ICD-10-CM | POA: Diagnosis not present

## 2019-12-10 DIAGNOSIS — N939 Abnormal uterine and vaginal bleeding, unspecified: Secondary | ICD-10-CM | POA: Diagnosis present

## 2019-12-10 DIAGNOSIS — O360910 Maternal care for other rhesus isoimmunization, first trimester, not applicable or unspecified: Secondary | ICD-10-CM

## 2019-12-10 LAB — COMPREHENSIVE METABOLIC PANEL
ALT: 13 U/L (ref 0–44)
AST: 13 U/L — ABNORMAL LOW (ref 15–41)
Albumin: 3.1 g/dL — ABNORMAL LOW (ref 3.5–5.0)
Alkaline Phosphatase: 34 U/L — ABNORMAL LOW (ref 38–126)
Anion gap: 9 (ref 5–15)
BUN: 7 mg/dL (ref 6–20)
CO2: 23 mmol/L (ref 22–32)
Calcium: 9.8 mg/dL (ref 8.9–10.3)
Chloride: 105 mmol/L (ref 98–111)
Creatinine, Ser: 0.57 mg/dL (ref 0.44–1.00)
GFR calc Af Amer: >60 mL/min (ref >60)
GFR calc non Af Amer: >60 mL/min (ref >60)
Glucose, Bld: 98 mg/dL (ref 70–99)
Potassium: 4 mmol/L (ref 3.5–5.1)
Sodium: 137 mmol/L (ref 135–145)
Total Bilirubin: 0.4 mg/dL (ref 0.3–1.2)
Total Protein: 5.8 g/dL — ABNORMAL LOW (ref 6.5–8.1)

## 2019-12-10 LAB — URINALYSIS, ROUTINE W REFLEX MICROSCOPIC
Bilirubin Urine: NEGATIVE
Glucose, UA: NEGATIVE mg/dL
Ketones, ur: NEGATIVE mg/dL
Nitrite: NEGATIVE
Protein, ur: 30 mg/dL — AB
RBC / HPF: >50 RBC/hpf — ABNORMAL HIGH (ref 0–5)
Specific Gravity, Urine: 1.02 (ref 1.005–1.030)
WBC, UA: >50 WBC/hpf — ABNORMAL HIGH (ref 0–5)
pH: 5 (ref 5.0–8.0)

## 2019-12-10 LAB — CBC
HCT: 36.7 % (ref 36.0–46.0)
Hemoglobin: 12.1 g/dL (ref 12.0–15.0)
MCH: 28.8 pg (ref 26.0–34.0)
MCHC: 33 g/dL (ref 30.0–36.0)
MCV: 87.4 fL (ref 80.0–100.0)
Platelets: 214 10*3/uL (ref 150–400)
RBC: 4.2 MIL/uL (ref 3.87–5.11)
RDW: 12.6 % (ref 11.5–15.5)
WBC: 10.1 10*3/uL (ref 4.0–10.5)
nRBC: 0 % (ref 0.0–0.2)

## 2019-12-10 LAB — WET PREP, GENITAL
Clue Cells Wet Prep HPF POC: NONE SEEN
Sperm: NONE SEEN
Trich, Wet Prep: NONE SEEN
Yeast Wet Prep HPF POC: NONE SEEN

## 2019-12-10 LAB — HCG, QUANTITATIVE, PREGNANCY: hCG, Beta Chain, Quant, S: 41500 m[IU]/mL — ABNORMAL HIGH (ref <5)

## 2019-12-10 MED ORDER — CEFADROXIL 500 MG PO CAPS: 500.0000 mg | ORAL_CAPSULE | Freq: Two times a day (BID) | ORAL | 0 refills | Status: AC

## 2019-12-10 MED ORDER — RHO D IMMUNE GLOBULIN 1500 UNIT/2ML IJ SOSY
300.0000 ug | PREFILLED_SYRINGE | Freq: Once | INTRAMUSCULAR | Status: AC
  Administered 2019-12-10: 300 ug via INTRAMUSCULAR
  Filled 2019-12-10: qty 2

## 2019-12-10 NOTE — Discharge Instructions (Signed)
Subchorionic Hematoma ° °A subchorionic hematoma is a gathering of blood between the outer wall of the embryo (chorion) and the inner wall of the womb (uterus). °This condition can cause vaginal bleeding. If they cause little or no vaginal bleeding, early small hematomas usually shrink on their own and do not affect your baby or pregnancy. When bleeding starts later in pregnancy, or if the hematoma is larger or occurs in older pregnant women, the condition may be more serious. Larger hematomas may get bigger, which increases the chances of miscarriage. This condition also increases the risk of: °· Premature separation of the placenta from the uterus. °· Premature (preterm) labor. °· Stillbirth. °What are the causes? °The exact cause of this condition is not known. It occurs when blood is trapped between the placenta and the uterine wall because the placenta has separated from the original site of implantation. °What increases the risk? °You are more likely to develop this condition if: °· You were treated with fertility medicines. °· You conceived through in vitro fertilization (IVF). °What are the signs or symptoms? °Symptoms of this condition include: °· Vaginal spotting or bleeding. °· Contractions of the uterus. These cause abdominal pain. °Sometimes you may have no symptoms and the bleeding may only be seen when ultrasound images are taken (transvaginal ultrasound). °How is this diagnosed? °This condition is diagnosed based on a physical exam. This includes a pelvic exam. You may also have other tests, including: °· Blood tests. °· Urine tests. °· Ultrasound of the abdomen. °How is this treated? °Treatment for this condition can vary. Treatment may include: °· Watchful waiting. You will be monitored closely for any changes in bleeding. During this stage: °? The hematoma may be reabsorbed by the body. °? The hematoma may separate the fluid-filled space containing the embryo (gestational sac) from the wall of the  womb (endometrium). °· Medicines. °· Activity restriction. This may be needed until the bleeding stops. °Follow these instructions at home: °· Stay on bed rest if told to do so by your health care provider. °· Do not lift anything that is heavier than 10 lbs. (4.5 kg) or as told by your health care provider. °· Do not use any products that contain nicotine or tobacco, such as cigarettes and e-cigarettes. If you need help quitting, ask your health care provider. °· Track and write down the number of pads you use each day and how soaked (saturated) they are. °· Do not use tampons. °· Keep all follow-up visits as told by your health care provider. This is important. Your health care provider may ask you to have follow-up blood tests or ultrasound tests or both. °Contact a health care provider if: °· You have any vaginal bleeding. °· You have a fever. °Get help right away if: °· You have severe cramps in your stomach, back, abdomen, or pelvis. °· You pass large clots or tissue. Save any tissue for your health care provider to look at. °· You have more vaginal bleeding, and you faint or become lightheaded or weak. °Summary °· A subchorionic hematoma is a gathering of blood between the outer wall of the placenta and the uterus. °· This condition can cause vaginal bleeding. °· Sometimes you may have no symptoms and the bleeding may only be seen when ultrasound images are taken. °· Treatment may include watchful waiting, medicines, or activity restriction. °This information is not intended to replace advice given to you by your health care provider. Make sure you discuss any questions you   have with your health care provider. Document Revised: 05/10/2017 Document Reviewed: 07/24/2016 Elsevier Patient Education  2020 [employer].                        Safe Medications in Pregnancy    Acne: Benzoyl Peroxide Salicylic Acid  Backache/Headache: Tylenol: 2 regular strength every 4 hours OR              2 Extra  strength every 6 hours  Colds/Coughs/Allergies: Benadryl (alcohol free) 25 mg every 6 hours as needed Breath right strips Claritin Cepacol throat lozenges Chloraseptic throat spray Cold-Eeze- up to three times per day Cough drops, alcohol free Flonase (by prescription only) Guaifenesin Mucinex Robitussin DM (plain only, alcohol free) Saline nasal spray/drops Sudafed (pseudoephedrine) & Actifed ** use only after [redacted] weeks gestation and if you do not have high blood pressure Tylenol Vicks Vaporub Zinc lozenges Zyrtec   Constipation: Colace Ducolax suppositories Fleet enema Glycerin suppositories Metamucil Milk of magnesia Miralax Senokot Smooth move tea  Diarrhea: Kaopectate Imodium A-D  *NO pepto Bismol  Hemorrhoids: Anusol Anusol HC Preparation H Tucks  Indigestion: Tums Maalox Mylanta Zantac  Pepcid  Insomnia: Benadryl (alcohol free) 25mg  every 6 hours as needed Tylenol PM Unisom, no Gelcaps  Leg Cramps: Tums MagGel  Nausea/Vomiting:  Bonine Dramamine Emetrol Ginger extract Sea bands Meclizine  Nausea medication to take during pregnancy:  Unisom (doxylamine succinate 25 mg tablets) Take one tablet daily at bedtime. If symptoms are not adequately controlled, the dose can be increased to a maximum recommended dose of two tablets daily (1/2 tablet in the morning, 1/2 tablet mid-afternoon and one at bedtime). Vitamin B6 100mg  tablets. Take one tablet twice a day (up to 200 mg per day).  Skin Rashes: Aveeno products Benadryl cream or 25mg  every 6 hours as needed Calamine Lotion 1% cortisone cream  Yeast infection: Gyne-lotrimin 7 Monistat 7   **If taking multiple medications, please check labels to avoid duplicating the same active ingredients **take medication as directed on the label ** Do not exceed 4000 mg of tylenol in 24 hours **Do not take medications that contain aspirin or ibuprofen      First Trimester of Pregnancy The  first trimester of pregnancy is from week 1 until the end of week 13 (months 1 through 3). A week after a sperm fertilizes an egg, the egg will implant on the wall of the uterus. This embryo will begin to develop into a baby. Genes from you and your partner will form the baby. The female genes will determine whether the baby will be a boy or a girl. At 6-8 weeks, the eyes and face will be formed, and the heartbeat can be seen on ultrasound. At the end of 12 weeks, all the baby's organs will be formed. Now that you are pregnant, you will want to do everything you can to have a healthy baby. Two of the most important things are to get good prenatal care and to follow your health care provider's instructions. Prenatal care is all the medical care you receive before the baby's birth. This care will help prevent, find, and treat any problems during the pregnancy and childbirth. Body changes during your first trimester Your body goes through many changes during pregnancy. The changes vary from woman to woman.  You may gain or lose a couple of pounds at first.  You may feel sick to your stomach (nauseous) and you may throw up (vomit). If the vomiting  is uncontrollable, call your health care provider.  You may tire easily.  You may develop headaches that can be relieved by medicines. All medicines should be approved by your health care provider.  You may urinate more often. Painful urination may mean you have a bladder infection.  You may develop heartburn as a result of your pregnancy.  You may develop constipation because certain hormones are causing the muscles that push stool through your intestines to slow down.  You may develop hemorrhoids or swollen veins (varicose veins).  Your breasts may begin to grow larger and become tender. Your nipples may stick out more, and the tissue that surrounds them (areola) may become darker.  Your gums may bleed and may be sensitive to brushing and  flossing.  Dark spots or blotches (chloasma, mask of pregnancy) may develop on your face. This will likely fade after the baby is born.  Your menstrual periods will stop.  You may have a loss of appetite.  You may develop cravings for certain kinds of food.  You may have changes in your emotions from day to day, such as being excited to be pregnant or being concerned that something may go wrong with the pregnancy and baby.  You may have more vivid and strange dreams.  You may have changes in your hair. These can include thickening of your hair, rapid growth, and changes in texture. Some women also have hair loss during or after pregnancy, or hair that feels dry or thin. Your hair will most likely return to normal after your baby is born. What to expect at prenatal visits During a routine prenatal visit:  You will be weighed to make sure you and the baby are growing normally.  Your blood pressure will be taken.  Your abdomen will be measured to track your baby's growth.  The fetal heartbeat will be listened to between weeks 10 and 14 of your pregnancy.  Test results from any previous visits will be discussed. Your health care provider may ask you:  How you are feeling.  If you are feeling the baby move.  If you have had any abnormal symptoms, such as leaking fluid, bleeding, severe headaches, or abdominal cramping.  If you are using any tobacco products, including cigarettes, chewing tobacco, and electronic cigarettes.  If you have any questions. Other tests that may be performed during your first trimester include:  Blood tests to find your blood type and to check for the presence of any previous infections. The tests will also be used to check for low iron levels (anemia) and protein on red blood cells (Rh antibodies). Depending on your risk factors, or if you previously had diabetes during pregnancy, you may have tests to check for high blood sugar that affects pregnant  women (gestational diabetes).  Urine tests to check for infections, diabetes, or protein in the urine.  An ultrasound to confirm the proper growth and development of the baby.  Fetal screens for spinal cord problems (spina bifida) and Down syndrome.  HIV (human immunodeficiency virus) testing. Routine prenatal testing includes screening for HIV, unless you choose not to have this test.  You may need other tests to make sure you and the baby are doing well. Follow these instructions at home: Medicines  Follow your health care provider's instructions regarding medicine use. Specific medicines may be either safe or unsafe to take during pregnancy.  Take a prenatal vitamin that contains at least 600 micrograms (mcg) of folic acid.  If you  develop constipation, try taking a stool softener if your health care provider approves. Eating and drinking   Eat a balanced diet that includes fresh fruits and vegetables, whole grains, good sources of protein such as meat, eggs, or tofu, and low-fat dairy. Your health care provider will help you determine the amount of weight gain that is right for you.  Avoid raw meat and uncooked cheese. These carry germs that can cause birth defects in the baby.  Eating four or five small meals rather than three large meals a day may help relieve nausea and vomiting. If you start to feel nauseous, eating a few soda crackers can be helpful. Drinking liquids between meals, instead of during meals, also seems to help ease nausea and vomiting.  Limit foods that are high in fat and processed sugars, such as fried and sweet foods.  To prevent constipation: ? Eat foods that are high in fiber, such as fresh fruits and vegetables, whole grains, and beans. ? Drink enough fluid to keep your urine clear or pale yellow. Activity  Exercise only as directed by your health care provider. Most women can continue their usual exercise routine during pregnancy. Try to exercise for  30 minutes at least 5 days a week. Exercising will help you: ? Control your weight. ? Stay in shape. ? Be prepared for labor and delivery.  Experiencing pain or cramping in the lower abdomen or lower back is a good sign that you should stop exercising. Check with your health care provider before continuing with normal exercises.  Try to avoid standing for long periods of time. Move your legs often if you must stand in one place for a long time.  Avoid heavy lifting.  Wear low-heeled shoes and practice good posture.  You may continue to have sex unless your health care provider tells you not to. Relieving pain and discomfort  Wear a good support bra to relieve breast tenderness.  Take warm sitz baths to soothe any pain or discomfort caused by hemorrhoids. Use hemorrhoid cream if your health care provider approves.  Rest with your legs elevated if you have leg cramps or low back pain.  If you develop varicose veins in your legs, wear support hose. Elevate your feet for 15 minutes, 3-4 times a day. Limit salt in your diet. Prenatal care  Schedule your prenatal visits by the twelfth week of pregnancy. They are usually scheduled monthly at first, then more often in the last 2 months before delivery.  Write down your questions. Take them to your prenatal visits.  Keep all your prenatal visits as told by your health care provider. This is important. Safety  Wear your seat belt at all times when driving.  Make a list of emergency phone numbers, including numbers for family, friends, the hospital, and police and fire departments. General instructions  Ask your health care provider for a referral to a local prenatal education class. Begin classes no later than the beginning of month 6 of your pregnancy.  Ask for help if you have counseling or nutritional needs during pregnancy. Your health care provider can offer advice or refer you to specialists for help with various needs.  Do not  use hot tubs, steam rooms, or saunas.  Do not douche or use tampons or scented sanitary pads.  Do not cross your legs for long periods of time.  Avoid cat litter boxes and soil used by cats. These carry germs that can cause birth defects in the baby and  possibly loss of the fetus by miscarriage or stillbirth.  Avoid all smoking, herbs, alcohol, and medicines not prescribed by your health care provider. Chemicals in these products affect the formation and growth of the baby.  Do not use any products that contain nicotine or tobacco, such as cigarettes and e-cigarettes. If you need help quitting, ask your health care provider. You may receive counseling support and other resources to help you quit.  Schedule a dentist appointment. At home, brush your teeth with a soft toothbrush and be gentle when you floss. Contact a health care provider if:  You have dizziness.  You have mild pelvic cramps, pelvic pressure, or nagging pain in the abdominal area.  You have persistent nausea, vomiting, or diarrhea.  You have a bad smelling vaginal discharge.  You have pain when you urinate.  You notice increased swelling in your face, hands, legs, or ankles.  You are exposed to fifth disease or chickenpox.  You are exposed to [place] measles (rubella) and have never had it. Get help right away if:  You have a fever.  You are leaking fluid from your vagina.  You have spotting or bleeding from your vagina.  You have severe abdominal cramping or pain.  You have rapid weight gain or loss.  You vomit blood or material that looks like coffee grounds.  You develop a severe headache.  You have shortness of breath.  You have any kind of trauma, such as from a fall or a car accident. Summary  The first trimester of pregnancy is from week 1 until the end of week 13 (months 1 through 3).  Your body goes through many changes during pregnancy. The changes vary from woman to woman.  You will have  routine prenatal visits. During those visits, your health care provider will examine you, discuss any test results you may have, and talk with you about how you are feeling. This information is not intended to replace advice given to you by your health care provider. Make sure you discuss any questions you have with your health care provider. Document Revised: 05/10/2017 Document Reviewed: 05/09/2016 Elsevier Patient Education  2020 [employer]      Pregnancy and Urinary Tract Infection  A urinary tract infection (UTI) is an infection of any part of the urinary tract. This includes the kidneys, the tubes that connect your kidneys to your bladder (ureters), the bladder, and the tube that carries urine out of your body (urethra). These organs make, store, and get rid of urine in the body. Your health care provider may use other names to describe the infection. An upper UTI affects the ureters and kidneys (pyelonephritis). A lower UTI affects the bladder (cystitis) and urethra (urethritis). Most urinary tract infections are caused by bacteria in your genital area, around the entrance to your urinary tract (urethra). These bacteria grow and cause irritation and inflammation of your urinary tract. You are more likely to develop a UTI during pregnancy because the physical and hormonal changes your body goes through can make it easier for bacteria to get into your urinary tract. Your growing baby also puts pressure on your bladder and can affect urine flow. It is important to recognize and treat UTIs in pregnancy because of the risk of serious complications for both you and your baby. How does this affect me? Symptoms of a UTI include:  Needing to urinate right away (urgently).  Frequent urination or passing small amounts of urine frequently.  Pain or burning with  urination.  Blood in the urine.  Urine that smells bad or unusual.  Trouble urinating.  Cloudy urine.  Pain in the abdomen or  lower back.  Vaginal discharge. You may also have:  Vomiting or a decreased appetite.  Confusion.  Irritability or tiredness.  A fever.  Diarrhea. How does this affect my baby? An untreated UTI during pregnancy could lead to a kidney infection or a systemic infection, which can cause health problems that could affect your baby. Possible complications of an untreated UTI include:  Giving birth to your baby before 37 weeks of pregnancy (premature).  Having a baby with a low birth weight.  Developing high blood pressure during pregnancy (preeclampsia).  Having a low hemoglobin level (anemia). What can I do to lower my risk? To prevent a UTI:  Go to the bathroom as soon as you feel the need. Do not hold urine for long periods of time.  Always wipe from front to back, especially after a bowel movement. Use each tissue one time when you wipe.  Empty your bladder after sex.  Keep your genital area dry.  Drink 6-10 glasses of water each day.  Do not douche or use deodorant sprays. How is this treated? Treatment for this condition may include:  Antibiotic medicines that are safe to take during pregnancy.  Other medicines to treat less common causes of UTI. Follow these instructions at home:  If you were prescribed an antibiotic medicine, take it as told by your health care provider. Do not stop using the antibiotic even if you start to feel better.  Keep all follow-up visits as told by your health care provider. This is important. Contact a health care provider if:  Your symptoms do not improve or they get worse.  You have abnormal vaginal discharge. Get help right away if you:  Have a fever.  Have nausea and vomiting.  Have back or side pain.  Feel contractions in your uterus.  Have lower belly pain.  Have a gush of fluid from your vagina.  Have blood in your urine. Summary  A urinary tract infection (UTI) is an infection of any part of the urinary  tract, which includes the kidneys, ureters, bladder, and urethra.  Most urinary tract infections are caused by bacteria in your genital area, around the entrance to your urinary tract (urethra).  You are more likely to develop a UTI during pregnancy.  If you were prescribed an antibiotic medicine, take it as told by your health care provider. Do not stop using the antibiotic even if you start to feel better. This information is not intended to replace advice given to you by your health care provider. Make sure you discuss any questions you have with your health care provider. Document Revised: 09/19/2018 Document Reviewed: 05/01/2018 Elsevier Patient Education  2020 [employer]        Pyelonephritis During Pregnancy  What are the causes? This condition is caused by a bacterial infection in the lower urinary tract that spreads to the kidney. What increases the risk? You are more likely to develop this condition if:  You have diabetes.  You have a history of frequent urinary tract infections. What are the signs or symptoms? Symptoms of this condition may begin with symptoms of a lower urinary tract infection. These may include:  A frequent urge to pass urine.  Burning pain when passing urine.  Pain and pressure in your lower abdomen.  Blood in your urine.  Cloudy or smelly urine.  As the infection spreads to your kidney, you may have these symptoms:  Fever.  Chills.  Pain and tenderness in your upper abdomen or in your back and sides (flank pain). Flank pain often affects one side of the body, usually the right side.  Nausea, vomiting, or loss of appetite. How is this diagnosed? This condition may be diagnosed based on:  Your symptoms and medical history.  A physical exam.  Tests to confirm the diagnosis. These may include: ? Blood tests to check kidney function and look for signs of infection. ? Urine tests to check for signs of infection, including bacteria,  white or red blood cells, and protein. ? Tests to grow and identify the type of bacteria that is causing the infection (urine culture). ? Imaging studies of your kidneys to learn more about your condition. How is this treated? This condition is treated in the hospital with antibiotics that are given into one of your veins through an IV. Your health care provider:  Will start you on an antibiotic that is effective against common urinary tract infections.  May switch to another antibiotic if the results of your urine culture show that your infection is caused by different bacteria.  Will be careful to choose antibiotics that are the safest during pregnancy. Other treatments may include:  IV fluids if you are nauseous and not able to drink fluids.  Pain and fever medicines.  Medicines for nausea and vomiting. You will be able to go home when your infection is under control. To prevent another infection, you may need to continue taking antibiotics by mouth until your baby is born. Follow these instructions at home: Medicines   Take over-the-counter and prescription medicines only as told by your health care provider.  Take your antibiotic medicine as told by your health care provider. Do not stop taking the antibiotic even if you start to feel better.  Continue to take your prenatal vitamins. Lifestyle   Follow instructions from your health care provider about eating or drinking restrictions. You may need to avoid: ? Foods and drinks with added sugar. ? Caffeine and fruit juice.  Drink enough fluid to keep your urine pale yellow.  Go to the bathroom frequently. Do not hold your urine. Try to empty your bladder completely.  Change your underwear every day. Wear all-cotton underwear. Do not wear tight underwear or pants. General instructions   Take these steps to lower the risk of bacteria getting into your urinary tract: ? Use liquid soap instead of bar soap when showering or  bathing. Bacteria can grow on bar soap. ? When you wash yourself, clean the urethra opening first. Use a washcloth to clean the area between your vagina and anus. Pat the area dry with a clean towel. ? Wash your hands before and after you go to the bathroom. ? Wipe yourself from front to back after going to the bathroom. ? Do not use douches, perfumed soap, creams, or powders. ? Do not soak in a bath for more than 30 minutes.  Return to your normal activities as told by your health care provider. Ask your health care provider what activities are safe for you.  Keep all follow-up visits as told by your health care provider. This is important. Contact a health care provider if:  You have chills or a fever.  You have any symptoms of infection that do not get better at home.  Symptoms of infection come back.  You have a reaction or side  effects from your antibiotic. Get help right away if:  You start having contractions. Summary  Pyelonephritis is an infection of the kidney or kidneys.  This condition results when a bacterial infection in the lower urinary tract spreads to the kidney.  Lower urinary tract infections are common during pregnancy.  Pyelonephritis causes chills, a fever, flank pain, and nausea.  Pyelonephritis is a serious infection that is usually treated in the hospital with IV antibiotics. This information is not intended to replace advice given to you by your health care provider. Make sure you discuss any questions you have with your health care provider. Document Revised: 09/19/2018 Document Reviewed: 08/29/2017 Elsevier Patient Education  2020 [employer].

## 2019-12-10 NOTE — MAU Provider Note (Signed)
History     CSN: [id number]  Arrival date and time: 12/10/19 1814   First Provider Initiated Contact with Patient 12/10/19 1912      Chief Complaint  Patient presents with  . Dysuria  . Vaginal Bleeding   Ms. Cassandra Harrison is a 29 y.o. G2P1001 at [redacted]w[redacted]d who presents to MAU for vaginal bleeding which began around 430/5PM today. Patient reports pink on tissue after wiping at that time. Patient reports she has continued to see blood on toilet tissue when wiping, but is now bright red. Patient denies needing to wear a pad. Pt also reports dysuria that started today. Patient describes pressure and pain in suprapubic area with urination and reports she also feels like she has to pee all the time. Patient also endorses frequency of urination. Patient has experienced UTIs in the past and reports it feels similar today.  Passing blood clots? no Blood soaking clothes? no Lightheaded/dizzy? no Significant pelvic pain or cramping? no Passed any tissue? no  Current pregnancy problems? Pt has not yet been seen Blood Type? B NEG Allergies? Olives, latex, mold, dust mites, pollen Current medications? PNVs Current PNC & next appt? [facility], NOB 12/24/2019  Pt denies vaginal discharge/odor/itching. Pt denies N/V, abdominal pain, constipation, diarrhea, or urinary problems. Pt denies fever, chills, fatigue, sweating or changes in appetite. Pt denies SOB or chest pain. Pt denies dizziness, HA, light-headedness, weakness.   OB History    Gravida  2   Para  1   Term  1   Preterm  0   AB  0   Living  1     SAB  0   TAB  0   Ectopic  0   Multiple  0   Live Births  1           Past Medical History:  Diagnosis Date  . Asthma    last used 11/29/11  . Bulging disc   . Chronic back pain   . Constipation   . Migraine     Past Surgical History:  Procedure Laterality Date  . NO PAST SURGERIES      Family History  Problem Relation Age of Onset  . Diabetes Mother   .  COPD Mother   . Heart disease Mother   . COPD Father   . Heart disease Father   . Cancer Sister   . Cancer Paternal Aunt   . Diabetes Maternal Grandmother   . Cancer Maternal Grandmother   . COPD Paternal Grandmother   . Heart disease Paternal Grandfather   . Other Neg Hx     Social History   Tobacco Use  . Smoking status: Current Every Day Smoker    Packs/day: 0.25  . Smokeless tobacco: Never Used  Substance Use Topics  . Alcohol use: Yes    Comment: occ  . Drug use: No    Allergies:  Allergies  Allergen Reactions  . Olive Oil Nausea And Vomiting    She can not have anything with olives in it  . Latex Rash    Patient states that she is allergic to latex condoms.  They give her a rash and a yeast infection.    Medications Prior to Admission  Medication Sig Dispense Refill Last Dose  . acetaminophen-codeine 120-12 MG/5ML solution Take 10 mLs by mouth every 4 (four) hours as needed for moderate pain. 240 mL 0   . albuterol (PROVENTIL HFA;VENTOLIN HFA) 108 (90 Base) MCG/ACT inhaler Inhale 1-2 puffs into  the lungs every 6 (six) hours as needed for wheezing or shortness of breath. 1 Inhaler 2   . benzonatate (TESSALON) 100 MG capsule Take 1 capsule (100 mg total) by mouth every 8 (eight) hours. 21 capsule 0   . dicyclomine (BENTYL) 20 MG tablet Take 1 tablet (20 mg total) by mouth 2 (two) times daily. 20 tablet 0   . fluticasone (FLONASE) 50 MCG/ACT nasal spray Place 2 sprays daily into both nostrils. 16 g 0   . Guaifenesin 1200 MG TB12 Take 1 tablet (1,200 mg total) by mouth 2 (two) times daily. 20 each 0   . loperamide (IMODIUM) 2 MG capsule Take 1 capsule (2 mg total) by mouth 4 (four) times daily as needed for diarrhea or loose stools. 12 capsule 0   . metroNIDAZOLE (FLAGYL) 500 MG tablet Take 1 tablet (500 mg total) by mouth 2 (two) times daily. 14 tablet 2   . ondansetron (ZOFRAN ODT) 4 MG disintegrating tablet Take 1 tablet (4 mg total) by mouth every 8 (eight) hours as  needed for nausea or vomiting. 10 tablet 0   . predniSONE (DELTASONE) 50 MG tablet Take 1 tablet (50 mg total) by mouth daily. 5 tablet 0   . Cetirizine HCl (ZYRTEC ALLERGY) 10 MG CAPS Take 10 mg by mouth daily.    Unknown at Unknown time    Review of Systems  Constitutional: Negative for chills, diaphoresis, fatigue and fever.  Eyes: Negative for visual disturbance.  Respiratory: Negative for shortness of breath.   Cardiovascular: Negative for chest pain.  Gastrointestinal: Negative for abdominal pain, constipation, diarrhea, nausea and vomiting.  Genitourinary: Positive for dysuria, frequency, pelvic pain, urgency and vaginal bleeding. Negative for flank pain and vaginal discharge.  Neurological: Negative for dizziness, weakness, light-headedness and headaches.   Physical Exam   Blood pressure (!) 104/57, pulse 71, temperature 98.5 F (36.9 C), temperature source Oral, resp. rate 17, height 5' 2.25" (1.581 m), weight 96.2 kg, last menstrual period 09/21/2019, SpO2 100 %.  Patient Vitals for the past 24 hrs:  BP Temp Temp src Pulse Resp SpO2 Height Weight  12/10/19 2110 (!) 104/57 -- -- 71 17 100 % -- --  12/10/19 1831 108/60 98.5 F (36.9 C) Oral 92 17 100 % 5' 2.25" (1.581 m) 96.2 kg   Physical Exam Constitutional:      General: She is not in acute distress.    Appearance: She is well-developed. She is not diaphoretic.  HENT:     Head: Normocephalic and atraumatic.  Pulmonary:     Effort: Pulmonary effort is normal.  Abdominal:     General: There is no distension.     Palpations: Abdomen is soft. There is no mass.     Tenderness: There is no abdominal tenderness. There is no guarding or rebound.  Skin:    General: Skin is warm and dry.  Neurological:     Mental Status: She is alert and oriented to person, place, and time.  Psychiatric:        Behavior: Behavior normal.        Thought Content: Thought content normal.        Judgment: Judgment normal.    Results for  orders placed or performed during the hospital encounter of 12/10/19 (from the past 24 hour(s))  Urinalysis, Routine w reflex microscopic     Status: Abnormal   Collection Time: 12/10/19  6:44 PM  Result Value Ref Range   Color, Urine YELLOW YELLOW   APPearance  CLOUDY (A) CLEAR   Specific Gravity, Urine 1.020 1.005 - 1.030   pH 5.0 5.0 - 8.0   Glucose, UA NEGATIVE NEGATIVE mg/dL   Hgb urine dipstick LARGE (A) NEGATIVE   Bilirubin Urine NEGATIVE NEGATIVE   Ketones, ur NEGATIVE NEGATIVE mg/dL   Protein, ur 30 (A) NEGATIVE mg/dL   Nitrite NEGATIVE NEGATIVE   Leukocytes,Ua LARGE (A) NEGATIVE   RBC / HPF >50 (H) 0 - 5 RBC/hpf   WBC, UA >50 (H) 0 - 5 WBC/hpf   Bacteria, UA FEW (A) NONE SEEN   Squamous Epithelial / LPF 11-20 0 - 5   Mucus PRESENT   CBC     Status: None   Collection Time: 12/10/19  7:27 PM  Result Value Ref Range   WBC 10.1 4.0 - 10.5 K/uL   RBC 4.20 3.87 - 5.11 MIL/uL   Hemoglobin 12.1 12.0 - 15.0 g/dL   HCT 16.136.7 36 - 46 %   MCV 87.4 80.0 - 100.0 fL   MCH 28.8 26.0 - 34.0 pg   MCHC 33.0 30.0 - 36.0 g/dL   RDW 09.612.6 04.511.5 - 40.915.5 %   Platelets 214 150 - 400 K/uL   nRBC 0.0 0.0 - 0.2 %  Comprehensive metabolic panel     Status: Abnormal   Collection Time: 12/10/19  7:27 PM  Result Value Ref Range   Sodium 137 135 - 145 mmol/L   Potassium 4.0 3.5 - 5.1 mmol/L   Chloride 105 98 - 111 mmol/L   CO2 23 22 - 32 mmol/L   Glucose, Bld 98 70 - 99 mg/dL   BUN 7 6 - 20 mg/dL   Creatinine, Ser 8.110.57 0.44 - 1.00 mg/dL   Calcium 9.8 8.9 - 91.410.3 mg/dL   Total Protein 5.8 (L) 6.5 - 8.1 g/dL   Albumin 3.1 (L) 3.5 - 5.0 g/dL   AST 13 (L) 15 - 41 U/L   ALT 13 0 - 44 U/L   Alkaline Phosphatase 34 (L) 38 - 126 U/L   Total Bilirubin 0.4 0.3 - 1.2 mg/dL   GFR calc non Af Amer >60 >60 mL/min   GFR calc Af Amer >60 >60 mL/min   Anion gap 9 5 - 15  hCG, quantitative, pregnancy     Status: Abnormal   Collection Time: 12/10/19  7:27 PM  Result Value Ref Range   hCG, Beta Chain, Quant,  S 41,500 (H) <5 mIU/mL  Rh IG workup (includes ABO/Rh)     Status: None (Preliminary result)   Collection Time: 12/10/19  7:27 PM  Result Value Ref Range   Gestational Age(Wks) 11.3    ABO/RH(D) B NEG    Antibody Screen NEG    Unit Number [id number]    Blood Component Type RHIG    Unit division 00    Status of Unit ISSUED    Transfusion Status      OK TO TRANSFUSE Performed at [facility], [address] [place], [place] [address]   Wet prep, genital     Status: Abnormal   Collection Time: 12/10/19  7:47 PM   Specimen: Vaginal  Result Value Ref Range   Yeast Wet Prep HPF POC NONE SEEN NONE SEEN   Trich, Wet Prep NONE SEEN NONE SEEN   Clue Cells Wet Prep HPF POC NONE SEEN NONE SEEN   WBC, Wet Prep HPF POC FEW (A) NONE SEEN   Sperm NONE SEEN    US OB Comp Less 14 Wks  Result  Date: 12/10/2019 CLINICAL DATA:  Vaginal bleeding EXAM: OBSTETRIC <14 WK ULTRASOUND TECHNIQUE: Transabdominal ultrasound was performed for evaluation of the gestation as well as the maternal uterus and adnexal regions. COMPARISON:  None. FINDINGS: Intrauterine gestational sac: Single Yolk sac:  Not Visualized. Embryo:  Visualized. Cardiac Activity: Visualized. Heart Rate: 154 bpm CRL: 62.6 mm   12 w 4 d                  [place] EDC: 06/19/2020 Subchorionic hemorrhage: Small subchorionic hemorrhage along the posterior sac. Maternal uterus/adnexae: Ovaries are within normal limits. Right ovary measures 3.2 x 1.9 by 3.5 cm. Left ovary measures 3.1 x 2.2 x 2 cm. No significant free fluid. IMPRESSION: Single viable intrauterine pregnancy as above. Small subchorionic hemorrhage. Electronically Signed   By: Jasmine Pang M.D.   On: 12/10/2019 20:36    MAU Course  Procedures  MDM -r/o ectopic -UA: cloudy/lg hgb/30PRO/lg leuks/few bacteria, sending urine for culture -CBC: WNL -CMP: no abnormalities requiring treatment -[place]: single IUP, FHR 154, [redacted]w[redacted]d, small SCH -hCG: 41,500 -ABO: B NEGATIVE, RhoGAM  given -WetPrep: WNL -GC/CT collected -pt discharged to home in stable condition  Orders Placed This Encounter  Procedures  . Wet prep, genital    Standing Status:   Standing    Number of Occurrences:   1  . US OB Comp Less 14 Wks    Standing Status:   Standing    Number of Occurrences:   1    Order Specific Question:   Symptom/Reason for Exam    Answer:   Vaginal bleeding in pregnancy [705036]  . Urinalysis, Routine w reflex microscopic    Standing Status:   Standing    Number of Occurrences:   1  . CBC    Standing Status:   Standing    Number of Occurrences:   1  . Comprehensive metabolic panel    Standing Status:   Standing    Number of Occurrences:   1  . hCG, quantitative, pregnancy    Standing Status:   Standing    Number of Occurrences:   1  . Diet NPO time specified    Standing Status:   Standing    Number of Occurrences:   1  . Rh IG workup (includes ABO/Rh)    Standing Status:   Standing    Number of Occurrences:   1    Order Specific Question:   Weeks of Gestation    Answer:   11.3  . Discharge patient    Order Specific Question:   Discharge disposition    Answer:   01-Home or Self Care [1]    Order Specific Question:   Discharge patient date    Answer:   12/10/2019   Meds ordered this encounter  Medications  . rho (d) immune globulin (RHIG/RHOPHYLAC) injection 300 mcg  . cefadroxil (DURICEF) 500 MG capsule    Sig: Take 1 capsule (500 mg total) by mouth 2 (two) times daily for 7 days.    Dispense:  14 capsule    Refill:  0    Order Specific Question:   Supervising Provider    Answer:   [place] [[id number]]    Assessment and Plan    1. Vaginal bleeding in pregnancy   2. Rh negative state in antepartum period   3. [redacted] weeks gestation of pregnancy   4. Intrauterine pregnancy   5. Dysuria   6. Urinary frequency   7. Pelvic cramping  Allergies as of 12/10/2019      Reactions   Olive Oil Nausea And Vomiting   She can not have anything with  olives in it   Latex Rash   Patient states that she is allergic to latex condoms.  They give her a rash and a yeast infection.      Medication List    TAKE these medications   acetaminophen-codeine 120-12 MG/5ML solution Take 10 mLs by mouth every 4 (four) hours as needed for moderate pain.   albuterol 108 (90 Base) MCG/ACT inhaler Commonly known as: VENTOLIN HFA Inhale 1-2 puffs into the lungs every 6 (six) hours as needed for wheezing or shortness of breath.   benzonatate 100 MG capsule Commonly known as: TESSALON Take 1 capsule (100 mg total) by mouth every 8 (eight) hours.   cefadroxil 500 MG capsule Commonly known as: DURICEF Take 1 capsule (500 mg total) by mouth 2 (two) times daily for 7 days.   dicyclomine 20 MG tablet Commonly known as: BENTYL Take 1 tablet (20 mg total) by mouth 2 (two) times daily.   fluticasone 50 MCG/ACT nasal spray Commonly known as: FLONASE Place 2 sprays daily into both nostrils.   Guaifenesin 1200 MG Tb12 Take 1 tablet (1,200 mg total) by mouth 2 (two) times daily.   loperamide 2 MG capsule Commonly known as: IMODIUM Take 1 capsule (2 mg total) by mouth 4 (four) times daily as needed for diarrhea or loose stools.   metroNIDAZOLE 500 MG tablet Commonly known as: FLAGYL Take 1 tablet (500 mg total) by mouth 2 (two) times daily.   ondansetron 4 MG disintegrating tablet Commonly known as: Zofran ODT Take 1 tablet (4 mg total) by mouth every 8 (eight) hours as needed for nausea or vomiting.   predniSONE 50 MG tablet Commonly known as: DELTASONE Take 1 tablet (50 mg total) by mouth daily.   ZyrTEC Allergy 10 MG Caps Generic drug: Cetirizine HCl Take 10 mg by mouth daily.       -will call with culture results, if positive -RX Cefadroxil for presumptive UTI treatment -discussed expected s/sx of [facility] -return MAU precautions given -pt discharged to home in stable condition  Joni Reining E Tijuan Dantes 12/10/2019, 9:27 PM

## 2019-12-10 NOTE — MAU Note (Signed)
Went to the bathroom and there was blood when she wiped, pink.  Is having pain with urination, frequency and urgency.  Went to the preg center, but hasn't seen an OB yet.

## 2019-12-10 NOTE — Progress Notes (Signed)
Patient given RhoGam during this visit. Card provided to patient for her records.

## 2019-12-11 LAB — RH IG WORKUP (INCLUDES ABO/RH)
ABO/RH(D): B NEG
Antibody Screen: NEGATIVE
Gestational Age(Wks): 11.3
Unit division: 0

## 2019-12-11 LAB — GC/CHLAMYDIA PROBE AMP (~~LOC~~) NOT AT ARMC
Chlamydia: NEGATIVE
Comment: NEGATIVE
Comment: NORMAL
Neisseria Gonorrhea: NEGATIVE

## 2019-12-24 ENCOUNTER — Encounter: Payer: Self-pay | Admitting: Obstetrics

## 2019-12-24 ENCOUNTER — Other Ambulatory Visit (HOSPITAL_COMMUNITY)
Admission: RE | Admit: 2019-12-24 | Discharge: 2019-12-24 | Disposition: A | Discharge status: Home / Self Care | Payer: Medicaid Other | Source: Ambulatory Visit | Attending: Obstetrics | Admitting: Obstetrics

## 2019-12-24 ENCOUNTER — Ambulatory Visit (INDEPENDENT_AMBULATORY_CARE_PROVIDER_SITE_OTHER): Payer: Medicaid Other | Admitting: Obstetrics

## 2019-12-24 ENCOUNTER — Other Ambulatory Visit: Payer: Self-pay

## 2019-12-24 VITALS — BP 97/64 | HR 74 | Wt 211.0 lb

## 2019-12-24 DIAGNOSIS — O99210 Obesity complicating pregnancy, unspecified trimester: Secondary | ICD-10-CM

## 2019-12-24 DIAGNOSIS — Z3A13 13 weeks gestation of pregnancy: Secondary | ICD-10-CM | POA: Diagnosis not present

## 2019-12-24 DIAGNOSIS — E669 Obesity, unspecified: Secondary | ICD-10-CM | POA: Diagnosis not present

## 2019-12-24 DIAGNOSIS — Z6838 Body mass index (BMI) 38.0-38.9, adult: Secondary | ICD-10-CM | POA: Diagnosis not present

## 2019-12-24 DIAGNOSIS — Z3480 Encounter for supervision of other normal pregnancy, unspecified trimester: Secondary | ICD-10-CM | POA: Diagnosis present

## 2019-12-24 DIAGNOSIS — O99213 Obesity complicating pregnancy, third trimester: Secondary | ICD-10-CM

## 2019-12-24 MED ORDER — BLOOD PRESSURE KIT DEVI: 1.0000 | 0 refills | Status: DC | PRN

## 2019-12-24 MED ORDER — ASPIRIN 81 MG PO CHEW: 81.0000 mg | CHEWABLE_TABLET | Freq: Every day | ORAL | 5 refills | Status: DC

## 2019-12-24 MED ORDER — DOCUSATE SODIUM 100 MG PO CAPS: 100.0000 mg | ORAL_CAPSULE | Freq: Two times a day (BID) | ORAL | 5 refills | Status: DC

## 2019-12-24 MED ORDER — VITAFOL GUMMIES 3.33-0.333-34.8 MG PO CHEW: 3.0000 | CHEWABLE_TABLET | Freq: Every day | ORAL | 11 refills | Status: DC

## 2019-12-24 MED ORDER — PRENATAL GUMMIES/DHA & FA 0.4-32.5 MG PO CHEW
3.0000 | CHEWABLE_TABLET | Freq: Every day | ORAL | 12 refills | Status: DC
Start: 2019-12-24 — End: 2023-01-30

## 2019-12-24 MED ORDER — FERROUS SULFATE 325 (65 FE) MG PO TABS: 325.0000 mg | ORAL_TABLET | Freq: Two times a day (BID) | ORAL | 5 refills | Status: DC

## 2019-12-24 NOTE — Progress Notes (Signed)
Subjective:    Cassandra Harrison is being seen today for her first obstetrical visit.  This is a planned pregnancy. She is at 27w3dgestation. Her obstetrical history is significant for obesity. Relationship with FOB: significant other, living together. Patient does intend to breast feed. Pregnancy history fully reviewed.  The information documented in the HPI was reviewed and verified.  Menstrual History: OB History    Gravida  2   Para  1   Term  1   Preterm  0   AB  0   Living  1     SAB  0   TAB  0   Ectopic  0   Multiple  0   Live Births  1            Patient's last menstrual period was 09/21/2019.    Past Medical History:  Diagnosis Date  . Asthma    last used 11/29/11  . Bulging disc   . Chronic back pain   . Constipation   . Migraine     Past Surgical History:  Procedure Laterality Date  . NO PAST SURGERIES      (Not in a hospital admission)  Allergies  Allergen Reactions  . Olive Oil Nausea And Vomiting    She can not have anything with olives in it  . Latex Rash    Patient states that she is allergic to latex condoms.  They give her a rash and a yeast infection.    Social History   Tobacco Use  . Smoking status: Current Every Day Smoker    Packs/day: 0.25  . Smokeless tobacco: Never Used  Substance Use Topics  . Alcohol use: Not Currently    Comment: occ    Family History  Problem Relation Age of Onset  . Diabetes Mother   . COPD Mother   . Heart disease Mother   . COPD Father   . Heart disease Father   . Cancer Sister   . Cancer Paternal Aunt   . Diabetes Maternal Grandmother   . Cancer Maternal Grandmother   . COPD Paternal Grandmother   . Heart disease Paternal Grandfather   . Other Neg Hx      Review of Systems Constitutional: negative for weight loss Gastrointestinal: negative for vomiting Genitourinary:negative for genital lesions and vaginal discharge and dysuria Musculoskeletal:negative for back  pain Behavioral/Psych: negative for abusive relationship, depression, illegal drug usage and tobacco use    Objective:    BP 97/64   Pulse 74   Wt 211 lb (95.7 kg)   LMP 09/21/2019   BMI 38.28 kg/m  General Appearance:    Alert, cooperative, no distress, appears stated age  Head:    Normocephalic, without obvious abnormality, atraumatic  Eyes:    PERRL, conjunctiva/corneas clear, EOM's intact, fundi    benign, both eyes  Ears:    Normal TM's and external ear canals, both ears  Nose:   Nares normal, septum midline, mucosa normal, no drainage    or sinus tenderness  Throat:   Lips, mucosa, and tongue normal; teeth and gums normal  Neck:   Supple, symmetrical, trachea midline, no adenopathy;    thyroid:  no enlargement/tenderness/nodules; no carotid   bruit or JVD  Back:     Symmetric, no curvature, ROM normal, no CVA tenderness  Lungs:     Clear to auscultation bilaterally, respirations unlabored  Chest Wall:    No tenderness or deformity   Heart:    Regular  rate and rhythm, S1 and S2 normal, no murmur, rub   or gallop  Breast Exam:    No tenderness, masses, or nipple abnormality  Abdomen:     Soft, non-tender, bowel sounds active all four quadrants,    no masses, no organomegaly  Genitalia:    Normal female without lesion, discharge or tenderness  Extremities:   Extremities normal, atraumatic, no cyanosis or edema  Pulses:   2+ and symmetric all extremities  Skin:   Skin color, texture, turgor normal, no rashes or lesions  Lymph nodes:   Cervical, supraclavicular, and axillary nodes normal  Neurologic:   CNII-XII intact, normal strength, sensation and reflexes    throughout      Lab Review Urine pregnancy test Labs reviewed yes Radiologic studies reviewed yes  Assessment:    Pregnancy at 24w3dweeks    Plan:     1. Supervision of other normal pregnancy, antepartum Rx: - Blood Pressure Monitoring (BLOOD PRESSURE KIT) DEVI; 1 kit by Does not apply route as needed.  (Patient not taking: Reported on 12/24/2019)  Dispense: 1 each; Refill: 0 - Obstetric Panel, Including HIV - Cytology - PAP( [facility]) - Cervicovaginal ancillary only( [facility]) - Genetic Screening - Babyscripts Schedule Optimization - Hepatitis C Antibody - Prenatal Vit-Fe Phos-FA-Omega (VITAFOL GUMMIES) 3.33-0.333-34.8 MG CHEW; Chew 3 tablets by mouth daily before breakfast.  Dispense: 90 tablet; Refill: 11 - docusate sodium (COLACE) 100 MG capsule; Take 1 capsule (100 mg total) by mouth 2 (two) times daily.  Dispense: 60 capsule; Refill: 5 - [place] OB DETAIL +14 WK; Future - ferrous sulfate 325 (65 FE) MG tablet; Take 1 tablet (325 mg total) by mouth 2 (two) times daily with a meal.  Dispense: 60 tablet; Refill: 5  2. Obesity affecting pregnancy, antepartum Rx: - aspirin 81 MG chewable tablet; Chew 1 tablet (81 mg total) by mouth daily.  Dispense: 30 tablet; Refill: 5   Prenatal vitamins.  Counseling provided regarding continued use of seat belts, cessation of alcohol consumption, smoking or use of illicit drugs; infection precautions i.e., influenza/TDAP immunizations, toxoplasmosis,CMV, parvovirus, listeria and varicella; workplace safety, exercise during pregnancy; routine dental care, safe medications, sexual activity, hot tubs, saunas, pools, travel, caffeine use, fish and methlymercury, potential toxins, hair treatments, varicose veins Weight gain recommendations per IOM guidelines reviewed: underweight/BMI< 18.5--> gain 28 - 40 lbs; normal weight/BMI 18.5 - 24.9--> gain 25 - 35 lbs; overweight/BMI 25 - 29.9--> gain 15 - 25 lbs; obese/BMI >30->gain  11 - 20 lbs Problem list reviewed and updated. FIRST/CF mutation testing/NIPT/QUAD SCREEN/fragile X/Ashkenazi Jewish population testing/Spinal muscular atrophy discussed: requested. Role of ultrasound in pregnancy discussed; fetal survey: requested. Amniocentesis discussed: not indicated. VBAC calculator score: VBAC consent form  provided Meds ordered this encounter  Medications  . Blood Pressure Monitoring (BLOOD PRESSURE KIT) DEVI    Sig: 1 kit by Does not apply route as needed.    Dispense:  1 each    Refill:  0    Large cuff  . Prenatal MV-Min-FA-Omega-3 (PRENATAL GUMMIES/DHA & FA) 0.4-32.5 MG CHEW    Sig: Chew 3 tablets by mouth daily.    Dispense:  90 tablet    Refill:  12  . Prenatal Vit-Fe Phos-FA-Omega (VITAFOL GUMMIES) 3.33-0.333-34.8 MG CHEW    Sig: Chew 3 tablets by mouth daily before breakfast.    Dispense:  90 tablet    Refill:  11  . docusate sodium (COLACE) 100 MG capsule    Sig: Take 1 capsule (  100 mg total) by mouth 2 (two) times daily.    Dispense:  60 capsule    Refill:  5   Orders Placed This Encounter  Procedures  . [place] MFM OB COMP + 14 WK    Standing Status:   Future    Standing Expiration Date:   12/23/2020    Order Specific Question:   Reason for Exam (SYMPTOM  OR DIAGNOSIS REQUIRED)    Answer:   Anatomy    Order Specific Question:   Preferred Location    Answer:   [facility] Ultrasound  . Obstetric Panel, Including HIV  . Genetic Screening  . Hepatitis C Antibody    Follow up in 4 weeks. 50% of 25 min visit spent on counseling and coordination of care.    Shelly Bombard, MD 12/24/2019 12:06 PM

## 2019-12-24 NOTE — Progress Notes (Signed)
NOB in office, complains of constipation, unplanned pregnancy.

## 2019-12-25 LAB — OBSTETRIC PANEL, INCLUDING HIV
Basophils Absolute: 0 10*3/uL (ref 0.0–0.2)
Basos: 1 %
EOS (ABSOLUTE): 0.1 10*3/uL (ref 0.0–0.4)
Eos: 1 %
HIV Screen 4th Generation wRfx: NONREACTIVE
Hematocrit: 41.4 % (ref 34.0–46.6)
Hemoglobin: 13.4 g/dL (ref 11.1–15.9)
Hepatitis B Surface Ag: NEGATIVE
Immature Grans (Abs): 0 10*3/uL (ref 0.0–0.1)
Immature Granulocytes: 0 %
Lymphocytes Absolute: 1.4 10*3/uL (ref 0.7–3.1)
Lymphs: 21 %
MCH: 28.3 pg (ref 26.6–33.0)
MCHC: 32.4 g/dL (ref 31.5–35.7)
MCV: 88 fL (ref 79–97)
Monocytes Absolute: 0.4 10*3/uL (ref 0.1–0.9)
Monocytes: 6 %
Neutrophils Absolute: 4.6 10*3/uL (ref 1.4–7.0)
Neutrophils: 71 %
Platelets: 205 10*3/uL (ref 150–450)
RBC: 4.73 x10E6/uL (ref 3.77–5.28)
RDW: 12.3 % (ref 11.7–15.4)
RPR Ser Ql: NONREACTIVE
Rh Factor: NEGATIVE
Rubella Antibodies, IGG: 2.49 index (ref >0.99)
WBC: 6.5 10*3/uL (ref 3.4–10.8)

## 2019-12-25 LAB — CYTOLOGY - PAP
Comment: NEGATIVE
Diagnosis: NEGATIVE
High risk HPV: NEGATIVE

## 2019-12-25 LAB — AB SCR+ANTIBODY ID: Antibody Screen: POSITIVE — AB

## 2019-12-25 LAB — CERVICOVAGINAL ANCILLARY ONLY
Bacterial Vaginitis (gardnerella): POSITIVE — AB
Candida Glabrata: NEGATIVE
Candida Vaginitis: POSITIVE — AB
Chlamydia: NEGATIVE
Comment: NEGATIVE
Comment: NEGATIVE
Comment: NEGATIVE
Comment: NEGATIVE
Comment: NEGATIVE
Comment: NORMAL
Neisseria Gonorrhea: NEGATIVE
Trichomonas: NEGATIVE

## 2019-12-25 LAB — HEPATITIS C ANTIBODY: Hep C Virus Ab: <0.1 s/co ratio (ref 0.0–0.9)

## 2019-12-26 ENCOUNTER — Other Ambulatory Visit: Payer: Self-pay | Admitting: Obstetrics

## 2019-12-26 DIAGNOSIS — B379 Candidiasis, unspecified: Secondary | ICD-10-CM

## 2019-12-26 DIAGNOSIS — N760 Acute vaginitis: Secondary | ICD-10-CM

## 2019-12-26 MED ORDER — TERCONAZOLE 0.4 % VA CREA: 1.0000 | TOPICAL_CREAM | Freq: Every day | VAGINAL | 0 refills | Status: DC

## 2019-12-26 MED ORDER — METRONIDAZOLE 500 MG PO TABS: 500.0000 mg | ORAL_TABLET | Freq: Two times a day (BID) | ORAL | 2 refills | Status: DC

## 2019-12-29 ENCOUNTER — Encounter: Payer: Self-pay | Admitting: Obstetrics

## 2019-12-30 ENCOUNTER — Encounter: Payer: Self-pay | Admitting: Obstetrics

## 2020-01-21 ENCOUNTER — Ambulatory Visit (INDEPENDENT_AMBULATORY_CARE_PROVIDER_SITE_OTHER): Payer: Medicaid Other | Admitting: Obstetrics

## 2020-01-21 ENCOUNTER — Encounter: Payer: Medicaid Other | Admitting: Family Medicine

## 2020-01-21 ENCOUNTER — Encounter: Payer: Self-pay | Admitting: Obstetrics

## 2020-01-21 VITALS — BP 107/60 | HR 86 | Wt 214.4 lb

## 2020-01-21 DIAGNOSIS — O99330 Smoking (tobacco) complicating pregnancy, unspecified trimester: Secondary | ICD-10-CM

## 2020-01-21 DIAGNOSIS — E669 Obesity, unspecified: Secondary | ICD-10-CM

## 2020-01-21 DIAGNOSIS — O99332 Smoking (tobacco) complicating pregnancy, second trimester: Secondary | ICD-10-CM

## 2020-01-21 DIAGNOSIS — F17200 Nicotine dependence, unspecified, uncomplicated: Secondary | ICD-10-CM

## 2020-01-21 DIAGNOSIS — O99212 Obesity complicating pregnancy, second trimester: Secondary | ICD-10-CM

## 2020-01-21 DIAGNOSIS — Z3480 Encounter for supervision of other normal pregnancy, unspecified trimester: Secondary | ICD-10-CM

## 2020-01-21 DIAGNOSIS — O99210 Obesity complicating pregnancy, unspecified trimester: Secondary | ICD-10-CM

## 2020-01-21 DIAGNOSIS — Z3A17 17 weeks gestation of pregnancy: Secondary | ICD-10-CM

## 2020-01-21 NOTE — Progress Notes (Signed)
Virtual Visit via Telephone Note  I connected with Cassandra Harrison on 01/21/20 at 11:15 AM EDT by telephone and verified that I am speaking with the correct person using two identifiers.   Anatomy scheduled 02/01/20

## 2020-01-21 NOTE — Progress Notes (Signed)
   OBSTETRICS PRENATAL VIRTUAL VISIT ENCOUNTER NOTE  Provider location: Center for [facility] at [place]   I connected with [name] on 01/21/20 at 11:15 AM EDT by MyChart Video Encounter at home and verified that I am speaking with the correct person using two identifiers.   I discussed the limitations, risks, security and privacy concerns of performing an evaluation and management service virtually and the availability of in person appointments. I also discussed with the patient that there may be a patient responsible charge related to this service. The patient expressed understanding and agreed to proceed. Subjective:  Cassandra Harrison is a 29 y.o. G2P1001 at [redacted]w[redacted]d being seen today for ongoing prenatal care.  She is currently monitored for the following issues for this low-risk pregnancy and has Supervision of other normal pregnancy, antepartum on their problem list.  Patient reports no complaints.  Contractions: Not present. Vag. Bleeding: None.  Movement: Present. Denies any leaking of fluid.   The following portions of the patient's history were reviewed and updated as appropriate: allergies, current medications, past family history, past medical history, past social history, past surgical history and problem list.   Objective:   Vitals:   01/21/20 1123  BP: 107/60  Pulse: 86  Weight: 214 lb 6.4 oz (97.3 kg)    Fetal Status:     Movement: Present     General:  Alert, oriented and cooperative. Patient is in no acute distress.  Respiratory: Normal respiratory effort, no problems with respiration noted  Mental Status: Normal mood and affect. Normal behavior. Normal judgment and thought content.  Rest of physical exam deferred due to type of encounter  Imaging: No results found.  Assessment and Plan:  Pregnancy: G2P1001 at [redacted]w[redacted]d 1. Supervision of other normal pregnancy, antepartum  2. Tobacco smoking affecting pregnancy, antepartum - cessation  recommended  3. Obesity affecting pregnancy, antepartum   Preterm labor symptoms and general obstetric precautions including but not limited to vaginal bleeding, contractions, leaking of fluid and fetal movement were reviewed in detail with the patient. I discussed the assessment and treatment plan with the patient. The patient was provided an opportunity to ask questions and all were answered. The patient agreed with the plan and demonstrated an understanding of the instructions. The patient was advised to call back or seek an in-person office evaluation/go to MAU at [facility] for any urgent or concerning symptoms. Please refer to After Visit Summary for other counseling recommendations.   I provided 10 minutes of face-to-face time during this encounter.  Return in about 4 weeks (around 02/18/2020) for MyChart.  Future Appointments  Date Time Provider Department Center  02/01/2020 10:00 AM [facility] NURSE [facility]  02/01/2020 10:15 AM [facility] US2 [facility]    Coral Ceo, MD Center for [facility], [facility] 01/21/20

## 2020-02-01 ENCOUNTER — Ambulatory Visit: Payer: Medicaid Other | Admitting: *Deleted

## 2020-02-01 ENCOUNTER — Other Ambulatory Visit: Payer: Self-pay | Admitting: *Deleted

## 2020-02-01 ENCOUNTER — Encounter: Payer: Self-pay | Admitting: *Deleted

## 2020-02-01 ENCOUNTER — Other Ambulatory Visit: Payer: Self-pay

## 2020-02-01 ENCOUNTER — Ambulatory Visit: Payer: Medicaid Other | Attending: Obstetrics

## 2020-02-01 VITALS — BP 107/62 | HR 85

## 2020-02-01 DIAGNOSIS — O99332 Smoking (tobacco) complicating pregnancy, second trimester: Secondary | ICD-10-CM | POA: Insufficient documentation

## 2020-02-01 DIAGNOSIS — O322XX0 Maternal care for transverse and oblique lie, not applicable or unspecified: Secondary | ICD-10-CM

## 2020-02-01 DIAGNOSIS — O99212 Obesity complicating pregnancy, second trimester: Secondary | ICD-10-CM | POA: Diagnosis not present

## 2020-02-01 DIAGNOSIS — Z3480 Encounter for supervision of other normal pregnancy, unspecified trimester: Secondary | ICD-10-CM | POA: Insufficient documentation

## 2020-02-01 DIAGNOSIS — E669 Obesity, unspecified: Secondary | ICD-10-CM

## 2020-02-01 DIAGNOSIS — Z363 Encounter for antenatal screening for malformations: Secondary | ICD-10-CM

## 2020-02-01 DIAGNOSIS — Z3A20 20 weeks gestation of pregnancy: Secondary | ICD-10-CM | POA: Diagnosis not present

## 2020-02-01 DIAGNOSIS — Z362 Encounter for other antenatal screening follow-up: Secondary | ICD-10-CM

## 2020-02-18 ENCOUNTER — Telehealth: Payer: Medicaid Other

## 2020-02-23 ENCOUNTER — Telehealth (INDEPENDENT_AMBULATORY_CARE_PROVIDER_SITE_OTHER): Payer: Medicaid Other | Admitting: Family Medicine

## 2020-02-23 ENCOUNTER — Encounter: Payer: Self-pay | Admitting: Family Medicine

## 2020-02-23 DIAGNOSIS — O360120 Maternal care for anti-D [Rh] antibodies, second trimester, not applicable or unspecified: Secondary | ICD-10-CM

## 2020-02-23 DIAGNOSIS — Z6791 Unspecified blood type, Rh negative: Secondary | ICD-10-CM | POA: Insufficient documentation

## 2020-02-23 DIAGNOSIS — Z3A23 23 weeks gestation of pregnancy: Secondary | ICD-10-CM

## 2020-02-23 DIAGNOSIS — O26899 Other specified pregnancy related conditions, unspecified trimester: Secondary | ICD-10-CM | POA: Insufficient documentation

## 2020-02-23 DIAGNOSIS — Z3480 Encounter for supervision of other normal pregnancy, unspecified trimester: Secondary | ICD-10-CM

## 2020-02-23 NOTE — Progress Notes (Signed)
OBSTETRICS PRENATAL VIRTUAL VISIT ENCOUNTER NOTE  Provider location: Center for [facility] at [place]   I connected with Cassandra Harrison on 02/23/20 at  3:00 PM EDT by MyChart Video Encounter at home and verified that I am speaking with the correct person using two identifiers.   I discussed the limitations, risks, security and privacy concerns of performing an evaluation and management service virtually and the availability of in person appointments. I also discussed with the patient that there may be a patient responsible charge related to this service. The patient expressed understanding and agreed to proceed. Subjective:  Cassandra Harrison is a 29 y.o. G2P1001 at [redacted]w[redacted]d being seen today for ongoing prenatal care.  She is currently monitored for the following issues for this low-risk pregnancy and has Supervision of other normal pregnancy, antepartum and Rh negative state in antepartum period on their problem list.  Patient reports no complaints.  Contractions: Not present. Vag. Bleeding: None.  Movement: Present. Denies any leaking of fluid.   The following portions of the patient's history were reviewed and updated as appropriate: allergies, current medications, past family history, past medical history, past social history, past surgical history and problem list.   Objective:   Vitals:   02/23/20 1504  BP: 112/66  Pulse: 79  Weight: 222 lb (100.7 kg)    Fetal Status:     Movement: Present     General:  Alert, oriented and cooperative. Patient is in no acute distress.  Respiratory: Normal respiratory effort, no problems with respiration noted  Mental Status: Normal mood and affect. Normal behavior. Normal judgment and thought content.  Rest of physical exam deferred due to type of encounter  Imaging: [place] MFM OB DETAIL +14 WK  Result Date: 02/01/2020 ----------------------------------------------------------------------  OBSTETRICS REPORT                        (Signed Final 02/01/2020 01:31 pm) ---------------------------------------------------------------------- Patient Info  ID #:       [id number]                          D.O.B.:  [birth year] (29 yrs)  Name:       Cassandra Harrison               Visit Date: 02/01/2020 10:49 am ---------------------------------------------------------------------- Performed By  Attending:        Ma Rings MD         Ref. Address:     [address]                                                             [phone]  [place]                                                             [id number]  Performed By:     Sandi Harrison        [facility]  Referred By:      [facility] ---------------------------------------------------------------------- Orders  #  Description                           Code        Ordered By  1  [place] MFM OB DETAIL +14 WK               76811.01    Coral Ceo ----------------------------------------------------------------------  #  Order #                     Accession #                Episode #  1  [id number]                   [id number]                 [id number] ---------------------------------------------------------------------- Indications  Obesity complicating pregnancy, second         O99.212  trimester (BMI 36)  [redacted] weeks gestation of pregnancy                Z3A.20  Encounter for antenatal screening for          Z36.3  malformations (Low Risk NIPS) ---------------------------------------------------------------------- Fetal Evaluation  Num Of Fetuses:         1  Fetal Heart Rate(bpm):  135  Cardiac Activity:       Observed  Presentation:            Transverse, head to maternal right  Placenta:               Anterior  P. Cord Insertion:      Visualized  Amniotic Fluid  AFI FV:      Within normal limits  Largest Pocket(cm)                              6.6 ---------------------------------------------------------------------- Biometry  BPD:      46.8  mm     G. Age:  20w 1d         44  %    CI:        71.46   %    70 - 86                                                          FL/HC:      17.8   %    16.8 - 19.8  HC:      176.3  mm     G. Age:  20w 1d         34  %    HC/AC:      1.11        1.09 - 1.39  AC:      159.1  mm     G. Age:  21w 0d         69  %    FL/BPD:     67.1   %  FL:       31.4  mm     G. Age:  19w 5d         24  %    FL/AC:      19.7   %    20 - 24  HUM:      30.6  mm     G. Age:  20w 1d         47  %  CER:      20.1  mm     G. Age:  19w 3d         39  %  NFT:       4.1  mm  LV:        6.8  mm  CM:        6.1  mm  Est. FW:     352  gm    0 lb 12 oz      52  % ---------------------------------------------------------------------- OB History  Gravidity:    2         Term:   1  Living:       1 ---------------------------------------------------------------------- Gestational Age  LMP:           19w 0d        Date:  09/21/19                 EDD:   06/27/20  U/S Today:     20w 2d                                        EDD:   06/18/20  Best:          Cherylann Parr 2d     Det. By:  U/S (02/01/20)           EDD:   06/18/20 ---------------------------------------------------------------------- Anatomy  Cranium:  Appears normal         Aortic Arch:            Not well visualized  Cavum:                 Not well visualized    Ductal Arch:            Not well visualized  Ventricles:            Appears normal         Diaphragm:              Not well visualized  Choroid Plexus:        Appears normal         Stomach:                Appears normal, left                                                                         sided  Cerebellum:            Appears normal         Abdomen:                Appears normal  Posterior Fossa:       Appears normal         Abdominal Wall:         Appears nml (cord                                                                        insert, abd wall)  Nuchal Fold:           Appears normal         Cord Vessels:           Appears normal (3                                                                        vessel cord)  Face:                  Orbits nl; profile not Kidneys:                Appear normal                         well visualized  Lips:                  Not well visualized    Bladder:                Not well visualized  Thoracic:  Appears normal         Spine:                  Limited views                                                                        appear normal  Heart:                 Not well visualized    Upper Extremities:      Appears normal  RVOT:                  Not well visualized    Lower Extremities:      Appears normal  LVOT:                  Not well visualized  Other:  Fetus appears to be female. Technically difficult due to maternal          habitus and fetal position. ---------------------------------------------------------------------- Comments  This patient was seen for a detailed fetal anatomy scan due  to maternal obesity.  She denies any significant past medical history and denies  any problems in her current pregnancy.  She had a cell free DNA test earlier in her pregnancy which  indicated a low risk for trisomy 41, 27, and 13. A female fetus  is predicted.  Based on the fetal biometry measurements obtained today,  her EDC was changed to June 18, 2020.  There were no obvious fetal anomalies noted on today's  ultrasound exam.  However, the views of the fetal anatomy  were limited today due to the fetal position.  The patient was informed that anomalies may be missed due  to technical limitations. If the fetus is in a suboptimal position   or maternal habitus is increased, visualization of the fetus in  the maternal uterus may be impaired.  A follow-up exam was scheduled in 4 weeks to complete the  views of the fetal anatomy and to confirm her dates.[name] ----------------------------------------------------------------------                   Ma Rings, MD Electronically Signed Final Report   02/01/2020 01:31 pm ----------------------------------------------------------------------   Assessment and Plan:  Pregnancy: G2P1001 at [redacted]w[redacted]d 1. Supervision of other normal pregnancy, antepartum Continue routine prenatal care.   2. Rh negative state in antepartum period S/p Rhogam, will need again at 28 wks.  Preterm labor symptoms and general obstetric precautions including but not limited to vaginal bleeding, contractions, leaking of fluid and fetal movement were reviewed in detail with the patient. I discussed the assessment and treatment plan with the patient. The patient was provided an opportunity to ask questions and all were answered. The patient agreed with the plan and demonstrated an understanding of the instructions. The patient was advised to call back or seek an in-person office evaluation/go to MAU at [facility] for any urgent or concerning symptoms. Please refer to After Visit Summary for other counseling recommendations.   I provided 9 minutes of face-to-face time during this encounter.  Return in 4 weeks (on 03/22/2020) for in person, [facility], 28 wk labs.  Future Appointments  Date Time Provider  Department Center  02/29/2020 11:00 AM [facility]  02/29/2020 11:15 AM [facility]2 [facility]    Reva Boresanya S Darick Fetters, MD [facility], [facility]

## 2020-02-23 NOTE — Patient Instructions (Signed)

## 2020-02-23 NOTE — Progress Notes (Signed)
S/w pt for virtual visit. Pt reports fetal movement, denies pain. 

## 2020-02-29 ENCOUNTER — Ambulatory Visit: Payer: Medicaid Other | Attending: Obstetrics and Gynecology

## 2020-02-29 ENCOUNTER — Other Ambulatory Visit: Payer: Self-pay | Admitting: Obstetrics

## 2020-02-29 ENCOUNTER — Ambulatory Visit: Payer: Medicaid Other | Admitting: *Deleted

## 2020-02-29 ENCOUNTER — Other Ambulatory Visit: Payer: Self-pay | Admitting: *Deleted

## 2020-02-29 ENCOUNTER — Encounter: Payer: Self-pay | Admitting: *Deleted

## 2020-02-29 ENCOUNTER — Other Ambulatory Visit: Payer: Self-pay

## 2020-02-29 DIAGNOSIS — Z6791 Unspecified blood type, Rh negative: Secondary | ICD-10-CM

## 2020-02-29 DIAGNOSIS — Z362 Encounter for other antenatal screening follow-up: Secondary | ICD-10-CM | POA: Insufficient documentation

## 2020-02-29 DIAGNOSIS — O26899 Other specified pregnancy related conditions, unspecified trimester: Secondary | ICD-10-CM | POA: Diagnosis present

## 2020-02-29 DIAGNOSIS — O99212 Obesity complicating pregnancy, second trimester: Secondary | ICD-10-CM

## 2020-02-29 DIAGNOSIS — Z3A24 24 weeks gestation of pregnancy: Secondary | ICD-10-CM | POA: Diagnosis not present

## 2020-02-29 DIAGNOSIS — O321XX0 Maternal care for breech presentation, not applicable or unspecified: Secondary | ICD-10-CM | POA: Diagnosis not present

## 2020-03-01 ENCOUNTER — Encounter (HOSPITAL_COMMUNITY): Payer: Self-pay | Admitting: Obstetrics and Gynecology

## 2020-03-01 ENCOUNTER — Other Ambulatory Visit: Payer: Self-pay

## 2020-03-01 ENCOUNTER — Inpatient Hospital Stay (HOSPITAL_COMMUNITY)
Admission: AD | Admit: 2020-03-01 | Discharge: 2020-03-01 | Disposition: A | Discharge status: Home / Self Care | Payer: Medicaid Other | Attending: Obstetrics and Gynecology | Admitting: Obstetrics and Gynecology

## 2020-03-01 DIAGNOSIS — Z3A24 24 weeks gestation of pregnancy: Secondary | ICD-10-CM | POA: Insufficient documentation

## 2020-03-01 DIAGNOSIS — O99332 Smoking (tobacco) complicating pregnancy, second trimester: Secondary | ICD-10-CM | POA: Diagnosis not present

## 2020-03-01 DIAGNOSIS — Z7982 Long term (current) use of aspirin: Secondary | ICD-10-CM | POA: Insufficient documentation

## 2020-03-01 DIAGNOSIS — F17210 Nicotine dependence, cigarettes, uncomplicated: Secondary | ICD-10-CM | POA: Insufficient documentation

## 2020-03-01 DIAGNOSIS — O219 Vomiting of pregnancy, unspecified: Secondary | ICD-10-CM | POA: Diagnosis not present

## 2020-03-01 DIAGNOSIS — Z79899 Other long term (current) drug therapy: Secondary | ICD-10-CM | POA: Insufficient documentation

## 2020-03-01 DIAGNOSIS — O212 Late vomiting of pregnancy: Secondary | ICD-10-CM | POA: Diagnosis not present

## 2020-03-01 LAB — URINALYSIS, ROUTINE W REFLEX MICROSCOPIC
Bilirubin Urine: NEGATIVE
Glucose, UA: NEGATIVE mg/dL
Hgb urine dipstick: NEGATIVE
Ketones, ur: 20 mg/dL — AB
Nitrite: NEGATIVE
Protein, ur: NEGATIVE mg/dL
Specific Gravity, Urine: 1.018 (ref 1.005–1.030)
pH: 5 (ref 5.0–8.0)

## 2020-03-01 LAB — COMPREHENSIVE METABOLIC PANEL
ALT: 11 U/L (ref 0–44)
AST: 15 U/L (ref 15–41)
Albumin: 3.2 g/dL — ABNORMAL LOW (ref 3.5–5.0)
Alkaline Phosphatase: 46 U/L (ref 38–126)
Anion gap: 10 (ref 5–15)
BUN: 6 mg/dL (ref 6–20)
CO2: 20 mmol/L — ABNORMAL LOW (ref 22–32)
Calcium: 9.2 mg/dL (ref 8.9–10.3)
Chloride: 106 mmol/L (ref 98–111)
Creatinine, Ser: 0.53 mg/dL (ref 0.44–1.00)
GFR calc Af Amer: >60 mL/min (ref >60)
GFR calc non Af Amer: >60 mL/min (ref >60)
Glucose, Bld: 103 mg/dL — ABNORMAL HIGH (ref 70–99)
Potassium: 4 mmol/L (ref 3.5–5.1)
Sodium: 136 mmol/L (ref 135–145)
Total Bilirubin: 0.7 mg/dL (ref 0.3–1.2)
Total Protein: 6.5 g/dL (ref 6.5–8.1)

## 2020-03-01 LAB — CBC
HCT: 41.7 % (ref 36.0–46.0)
Hemoglobin: 13.5 g/dL (ref 12.0–15.0)
MCH: 28.5 pg (ref 26.0–34.0)
MCHC: 32.4 g/dL (ref 30.0–36.0)
MCV: 88 fL (ref 80.0–100.0)
Platelets: 192 10*3/uL (ref 150–400)
RBC: 4.74 MIL/uL (ref 3.87–5.11)
RDW: 12.6 % (ref 11.5–15.5)
WBC: 7.2 10*3/uL (ref 4.0–10.5)
nRBC: 0 % (ref 0.0–0.2)

## 2020-03-01 MED ORDER — PROMETHAZINE HCL 25 MG/ML IJ SOLN
25.0000 mg | Freq: Once | INTRAVENOUS | Status: AC
  Administered 2020-03-01: 25 mg via INTRAVENOUS
  Filled 2020-03-01: qty 1

## 2020-03-01 NOTE — MAU Note (Signed)
Cassandra Harrison is a 29 y.o. at [redacted]w[redacted]d here in MAU reporting: since 0230 has had diarrhea and vomiting.  Diarrhea x 8 since then, states watery. Still feeling nauseated. Emesis x4. Also having constant abdominal pain. States she thinks it might be food poisoning. No bleeding or LOF.  Onset of complaint: today  Pain score: 10/10  Vitals:   03/01/20 1437  BP: 128/65  Pulse: 85  Resp: 16  Temp: 98.5 F (36.9 C)  SpO2: 95%     FHT: +FM EFM applied  Lab orders placed from triage: UA

## 2020-03-01 NOTE — Discharge Instructions (Signed)
Take sips of fluids every hour. Start with small amounts of soft solids and progress to other foods as tolerated. No fried foods, greasy foods or highly spiced foods like pizza. Keep your appointments in the office. If diarrhea continues tomorrow, can take one to two doses of Imodium by the package directions.   Nausea and Vomiting, Adult Nausea is feeling sick to your stomach or feeling that you are about to throw up (vomit). Vomiting is when food in your stomach is thrown up and out of the mouth. Throwing up can make you feel weak. It can also make you lose too much water in your body (get dehydrated). If you lose too much water in your body, you may:  Feel tired.  Feel thirsty.  Have a dry mouth.  Have cracked lips.  Go pee (urinate) less often. Older adults and people with other diseases or a weak body defense system (immune system) are at higher risk for losing too much water in the body. If you feel sick to your stomach and you throw up, it is important to follow instructions from your doctor about how to take care of yourself. Follow these instructions at home: Watch your symptoms for any changes. Tell your doctor about them. Follow these instructions to care for yourself at home. Eating and drinking      Take an ORS (oral rehydration solution). This is a drink that is sold at pharmacies and stores.  Drink clear fluids in small amounts as you are able, such as: ? Water. ? Ice chips. ? Fruit juice that has water added (diluted fruit juice). ? Low-calorie sports drinks.  Eat bland, easy-to-digest foods in small amounts as you are able, such as: ? Bananas. ? Applesauce. ? Rice. ? Low-fat (lean) meats. ? Toast. ? Crackers.  Avoid drinking fluids that have a lot of sugar or caffeine in them. This includes energy drinks, sports drinks, and soda.  Avoid alcohol.  Avoid spicy or fatty foods. General instructions  Take over-the-counter and prescription medicines only  as told by your doctor.  Drink enough fluid to keep your pee (urine) pale yellow.  Wash your hands often with soap and water. If you cannot use soap and water, use hand sanitizer.  Make sure that all people in your home wash their hands well and often.  Rest at home while you get better.  Watch your condition for any changes.  Take slow and deep breaths when you feel sick to your stomach.  Keep all follow-up visits as told by your doctor. This is important. Contact a doctor if:  Your symptoms get worse.  You have new symptoms.  You have a fever.  You cannot drink fluids without throwing up.  You feel sick to your stomach for more than 2 days.  You feel light-headed or dizzy.  You have a headache.  You have muscle cramps.  You have a rash.  You have pain while peeing. Get help right away if:  You have pain in your chest, neck, arm, or jaw.  You feel very weak or you pass out (faint).  You throw up again and again.  You have throw up that is bright red or looks like black coffee grounds.  You have bloody or black poop (stools) or poop that looks like tar.  You have a very bad headache, a stiff neck, or both.  You have very bad pain, cramping, or bloating in your belly (abdomen).  You have trouble breathing.  You  are breathing very quickly.  Your heart is beating very quickly.  Your skin feels cold and clammy.  You feel confused.  You have signs of losing too much water in your body, such as: ? Dark pee, very little pee, or no pee. ? Cracked lips. ? Dry mouth. ? Sunken eyes. ? Sleepiness. ? Weakness. These symptoms may be an emergency. Do not wait to see if the symptoms will go away. Get medical help right away. Call your local emergency services (911 in the [place] Do not drive yourself to the hospital. Summary  Nausea is feeling sick to your stomach or feeling that you are about to throw up (vomit). Vomiting is when food in your stomach is thrown  up and out of the mouth.  Follow instructions from your doctor about eating and drinking to keep from losing too much water in your body.  Take over-the-counter and prescription medicines only as told by your doctor.  Contact your doctor if your symptoms get worse or you have new symptoms.  Keep all follow-up visits as told by your doctor. This is important. This information is not intended to replace advice given to you by your health care provider. Make sure you discuss any questions you have with your health care provider. Document Revised: 09/19/2018 Document Reviewed: 11/05/2017 Elsevier Patient Education  2020 [employer].

## 2020-03-01 NOTE — MAU Provider Note (Signed)
History     CSN: [id number]  Arrival date and time: 03/01/20 1352   First Provider Initiated Contact with Patient 03/01/20 1512      Chief Complaint  Patient presents with   Abdominal Pain   Nausea   Emesis   Diarrhea   HPI Cassandra Harrison 29 y.o. [redacted]w[redacted]d Comes to MAU after having diarrhea and vomiting since 3 am.  Last vomiting at 9 am.  Unable to give urine specimen now.  Continues to have nausea but has not vomited this afternoon.  Office recommended she come and be evaluated for dehydration.  She thinks she ate [place] and became ill.  Some family members who had the same food last night are also vomiting and having diarrhea.  [facility]  OB History    Gravida  2   Para  1   Term  1   Preterm  0   AB  0   Living  1     SAB  0   TAB  0   Ectopic  0   Multiple  0   Live Births  1           Past Medical History:  Diagnosis Date   Asthma    last used 11/29/11   Bulging disc    Chronic back pain    Constipation    Migraine     Past Surgical History:  Procedure Laterality Date   WISDOM TOOTH EXTRACTION      Family History  Problem Relation Age of Onset   Diabetes Mother    COPD Mother    Heart disease Mother    Cancer Mother        throat cancer   COPD Father    Heart disease Father    Cancer Sister    Cancer Paternal Aunt    Diabetes Maternal Grandmother    Cancer Maternal Grandmother    COPD Paternal Grandmother    Heart disease Paternal Grandfather    Other Neg Hx     Social History   Tobacco Use   Smoking status: Current Every Day Smoker    Packs/day: 0.25   Smokeless tobacco: Never Used  Vaping Use   Vaping Use: Never used  Substance Use Topics   Alcohol use: Not Currently    Comment: occ   Drug use: No    Allergies:  Allergies  Allergen Reactions   Olive Oil Nausea And Vomiting    She can not have anything with olives in it   Latex Rash     Patient states that she is allergic to latex condoms.  They give her a rash and a yeast infection.    Medications Prior to Admission  Medication Sig Dispense Refill Last Dose   albuterol (PROVENTIL HFA;VENTOLIN HFA) 108 (90 Base) MCG/ACT inhaler Inhale 1-2 puffs into the lungs every 6 (six) hours as needed for wheezing or shortness of breath. (Patient not taking: Reported on 12/24/2019) 1 Inhaler 2    aspirin 81 MG chewable tablet Chew 1 tablet (81 mg total) by mouth daily. 30 tablet 5    Blood Pressure Monitoring (BLOOD PRESSURE KIT) DEVI 1 kit by Does not apply route as needed. 1 each 0    docusate sodium (COLACE) 100 MG capsule Take 1 capsule (100 mg total) by mouth 2 (two) times daily. 60 capsule 5    ferrous sulfate 325 (65 FE) MG tablet Take 1 tablet (325 mg  total) by mouth 2 (two) times daily with a meal. 60 tablet 5    Prenatal MV-Min-FA-Omega-3 (PRENATAL GUMMIES/DHA & FA) 0.4-32.5 MG CHEW Chew 3 tablets by mouth daily. 90 tablet 12     Review of Systems  Constitutional: Negative for fever.  Respiratory: Negative for cough and shortness of breath.   Gastrointestinal: Positive for diarrhea, nausea and vomiting. Negative for abdominal pain.  Genitourinary: Negative for dysuria and vaginal discharge.   Physical Exam   Blood pressure 128/65, pulse 85, temperature 98.5 F (36.9 C), temperature source Oral, resp. rate 16, last menstrual period 09/21/2019, SpO2 95 %.  Physical Exam Vitals and nursing note reviewed.  Constitutional:      Appearance: She is well-developed. She is obese.  HENT:     Head: Normocephalic.  Cardiovascular:     Rate and Rhythm: Normal rate.     Pulses: Normal pulses.  Pulmonary:     Effort: Pulmonary effort is normal.  Abdominal:     Palpations: Abdomen is soft.     Tenderness: There is abdominal tenderness. There is no guarding or rebound.     Comments: FHT 140 with moderate variability 10x10 accels noted No decelerations No  contractions Category 1 tracing and appropriate for gestational age Baby difficult to trace so monitor discontinued due to client's abdominal discomfort - mild tenderness that is worse with mild palpation.  Some abdominal soreness also that client attributes to multiple episodes of vomiting.  Musculoskeletal:        General: Normal range of motion.     Cervical back: Neck supple.  Skin:    General: Skin is warm and dry.     Coloration: Skin is pale.  Neurological:     Mental Status: She is alert and oriented to person, place, and time.     MAU Course  Procedures  MDM Baby tracing is normal on monitor.  Client has abdominal discomfort and baby is hard to trace so monitor discontinued as client is not being evaluated for labor symptoms. Will start Phenergan in 1000cc LR as client has continual nausea and is unable to void. Client has been sleeping.  Has not had any vomiting or diarrhea while in MAU.  Nausea is better.  Will collect urine as client is being discharged.  Reviewed dietary instructions on restarting fluids and soft solid foods.   Next appointment at [facility] in October. Labs are normal.  No fever.  Not feeling worse.  Low suspicion for appendicitis.  Assessment and Plan  Nausea and vomiting in pregnancy - likely gastroenteritis  Plan Take sips of fluids every hour. Start with small amounts of soft solids and progress to other foods as tolerated. No fried foods, greasy foods or highly spiced foods like pizza. Keep your appointments in the office. If diarrhea continues tomorrow, can take one to two doses of Imodium by the package directions.  Shloimy Michalski L Elia Keenum 03/01/2020, 3:25 PM

## 2020-03-22 ENCOUNTER — Other Ambulatory Visit: Payer: Self-pay

## 2020-03-22 ENCOUNTER — Encounter: Payer: Self-pay | Admitting: Obstetrics and Gynecology

## 2020-03-22 ENCOUNTER — Ambulatory Visit (INDEPENDENT_AMBULATORY_CARE_PROVIDER_SITE_OTHER): Payer: Medicaid Other | Admitting: Obstetrics and Gynecology

## 2020-03-22 ENCOUNTER — Other Ambulatory Visit: Payer: Medicaid Other

## 2020-03-22 VITALS — BP 107/72 | HR 84 | Wt 221.0 lb

## 2020-03-22 DIAGNOSIS — Z6791 Unspecified blood type, Rh negative: Secondary | ICD-10-CM

## 2020-03-22 DIAGNOSIS — O99330 Smoking (tobacco) complicating pregnancy, unspecified trimester: Secondary | ICD-10-CM

## 2020-03-22 DIAGNOSIS — Z23 Encounter for immunization: Secondary | ICD-10-CM | POA: Diagnosis not present

## 2020-03-22 DIAGNOSIS — O26899 Other specified pregnancy related conditions, unspecified trimester: Secondary | ICD-10-CM | POA: Diagnosis not present

## 2020-03-22 DIAGNOSIS — O26893 Other specified pregnancy related conditions, third trimester: Secondary | ICD-10-CM | POA: Diagnosis not present

## 2020-03-22 DIAGNOSIS — O99210 Obesity complicating pregnancy, unspecified trimester: Secondary | ICD-10-CM

## 2020-03-22 DIAGNOSIS — Z3480 Encounter for supervision of other normal pregnancy, unspecified trimester: Secondary | ICD-10-CM

## 2020-03-22 MED ORDER — RHO D IMMUNE GLOBULIN 1500 UNIT/2ML IJ SOSY
300.0000 ug | PREFILLED_SYRINGE | Freq: Once | INTRAMUSCULAR | Status: AC
  Administered 2020-03-22: 300 ug via INTRAMUSCULAR

## 2020-03-22 NOTE — Patient Instructions (Signed)
Exercise During Pregnancy Exercise is an important part of being healthy for people of all ages. Exercise improves the function of your heart and lungs and helps you maintain strength, flexibility, and a healthy body weight. Exercise also boosts energy levels and elevates mood. Most women should exercise regularly during pregnancy. In rare cases, women with certain medical conditions or complications may be asked to limit or avoid exercise during pregnancy. How does this affect me? Along with maintaining general strength and flexibility, exercising during pregnancy can help:  Keep strength in muscles that are used during labor and childbirth.  Decrease low back pain.  Reduce symptoms of depression.  Control weight gain during pregnancy.  Reduce the risk of needing insulin if you develop diabetes during pregnancy.  Decrease the risk of cesarean delivery.  Speed up your recovery after giving birth. How does this affect my baby? Exercise can help you have a healthy pregnancy. Exercise does not cause premature birth. It will not cause your baby to weigh less at birth. What exercises can I do? Many exercises are safe for you to do during pregnancy. Do a variety of exercises that safely increase your heart and breathing rates and help you build and maintain muscle strength. Do exercises exactly as told by your health care provider. You may do these exercises:  Walking or hiking.  Swimming.  Water aerobics.  Riding a stationary bike.  Strength training.  Modified yoga or Pilates. Tell your instructor that you are pregnant. Avoid overstretching, and avoid lying on your back for long periods of time.  Running or jogging. Only choose this type of exercise if you: ? Ran or jogged regularly before your pregnancy. ? Can run or jog and still talk in complete sentences. What exercises should I avoid? Depending on your level of fitness and whether you exercised regularly before your  pregnancy, you may be told to limit high-intensity exercise. You can tell that you are exercising at a high intensity if you are breathing much harder and faster and cannot hold a conversation while exercising. You must avoid:  Contact sports.  Activities that put you at risk for falling on or being hit in the belly, such as downhill skiing, water skiing, surfing, rock climbing, cycling, gymnastics, and horseback riding.  Scuba diving.  Skydiving.  Yoga or Pilates in a room that is heated to high temperatures.  Jogging or running, unless you ran or jogged regularly before your pregnancy. While jogging or running, you should always be able to talk in full sentences. Do not run or jog so fast that you are unable to have a conversation.  Do not exercise at more than 6,000 feet above sea level (high elevation) if you are not used to exercising at high elevation. How do I exercise in a safe way?   Avoid overheating. Do not exercise in very high temperatures.  Wear loose-fitting, breathable clothes.  Avoid dehydration. Drink enough water before, during, and after exercise to keep your urine pale yellow.  Avoid overstretching. Because of hormone changes during pregnancy, it is easy to overstretch muscles, tendons, and ligaments during pregnancy.  Start slowly and ask your health care provider to recommend the types of exercise that are safe for you.  Do not exercise to lose weight. Follow these instructions at home:  Exercise on most days or all days of the week. Try to exercise for 30 minutes a day, 5 days a week, unless your health care provider tells you not to.  If   you actively exercised before your pregnancy and you are healthy, your health care provider may tell you to continue to do moderate to high-intensity exercise.  If you are just starting to exercise or did not exercise much before your pregnancy, your health care provider may tell you to do low to moderate-intensity  exercise. Questions to ask your health care provider  Is exercise safe for me?  What are signs that I should stop exercising?  Does my health condition mean that I should not exercise during pregnancy?  When should I avoid exercising during pregnancy? Stop exercising and contact a health care provider if: You have any unusual symptoms, such as:  Mild contractions of the uterus or cramps in the abdomen.  Dizziness that does not go away when you rest. Stop exercising and get help right away if: You have any unusual symptoms, such as:  Sudden, severe pain in your low back or your belly.  Mild contractions of the uterus or cramps in the abdomen that do not improve with rest and drinking fluids.  Chest pain.  Bleeding or fluid leaking from your vagina.  Shortness of breath. These symptoms may represent a serious problem that is an emergency. Do not wait to see if the symptoms will go away. Get medical help right away. Call your local emergency services (911 in the [place] Do not drive yourself to the hospital. Summary  Most women should exercise regularly throughout pregnancy. In rare cases, women with certain medical conditions or complications may be asked to limit or avoid exercise during pregnancy.  Do not exercise to lose weight during pregnancy.  Your health care provider will tell you what level of physical activity is right for you.  Stop exercising and contact a health care provider if you have mild contractions of the uterus or cramps in the abdomen. Get help right away if these contractions or cramps do not improve with rest and drinking fluids.  Stop exercising and get help right away if you have sudden, severe pain in your low back or belly, chest pain, shortness of breath, or bleeding or leaking of fluid from your vagina. This information is not intended to replace advice given to you by your health care provider. Make sure you discuss any questions you have with your  health care provider. Document Revised: 09/18/2018 Document Reviewed: 07/02/2018 [employer]vier Patient Education  2020 [employer]  

## 2020-03-22 NOTE — Progress Notes (Signed)
   PRENATAL VISIT NOTE  Subjective:  Cassandra Harrison is a 29 y.o. G2P1001 at [redacted]w[redacted]d being seen today for ongoing prenatal care.  She is currently monitored for the following issues for this low-risk pregnancy and has Supervision of other normal pregnancy, antepartum and Rh negative state in antepartum period on their problem list.  Patient reports no complaints.  Contractions: Not present. Vag. Bleeding: None.  Movement: Present. Denies leaking of fluid.   The following portions of the patient's history were reviewed and updated as appropriate: allergies, current medications, past family history, past medical history, past social history, past surgical history and problem list.   Objective:   Vitals:   03/22/20 0830  BP: 107/72  Pulse: 84  Weight: 221 lb (100.2 kg)    Fetal Status: Fetal Heart Rate (bpm): 138 Fundal Height: 29 cm Movement: Present     General:  Alert, oriented and cooperative. Patient is in no acute distress.  Skin: Skin is warm and dry. No rash noted.   Cardiovascular: Normal heart rate noted  Respiratory: Normal respiratory effort, no problems with respiration noted  Abdomen: Soft, gravid, appropriate for gestational age.  Pain/Pressure: Present     Pelvic: Cervical exam deferred        Extremities: Normal range of motion.  Edema: Mild pitting, slight indentation  Mental Status: Normal mood and affect. Normal behavior. Normal judgment and thought content.   Assessment and Plan:  Pregnancy: G2P1001 at [redacted]w[redacted]d 1. Supervision of other normal pregnancy, antepartum  - Glucose Tolerance, 2 Hours w/1 Hour - CBC - RPR - HIV Antibody (routine testing w rflx)  2. Need for Tdap vaccination  - Tdap vaccine greater than or equal to 7yo IM  3. Flu vaccine need  - Flu Vaccine QUAD 36+ mos IM (Fluarix, Quad PF)  4. Rh negative state in antepartum period - Patient for Rhogam today.  5. Tobacco smoking affecting pregnancy, antepartum - Tobacco cessation discussed.   6. Obesity affecting pregnancy, antepartum - Exercise in pregnancy discussed.   - Patient for repeat ultrasound on 04/25/2020.  Preterm labor symptoms and general obstetric precautions including but not limited to vaginal bleeding, contractions, leaking of fluid and fetal movement were reviewed in detail with the patient. Please refer to After Visit Summary for other counseling recommendations.   Return in about 2 weeks (around 04/05/2020) for LROB - virtual.  Future Appointments  Date Time Provider Department Center  04/25/2020  9:30 AM [facility]URSE [facility]  04/25/2020  9:45 AM [facility]5 [facility]    Johnny Bridge, MD

## 2020-03-22 NOTE — Progress Notes (Signed)
ROB [redacted]w[redacted]d  2hr GTT Today. Tdap: Desires Today Flu Vaccine: Desires today  Rhogam given on 12/10/19 repeat at 28 weeks per last office note on 02/23/20  will confirm with provider if ok to give today or wait.    CC: None

## 2020-03-23 LAB — CBC
Hematocrit: 36.6 % (ref 34.0–46.6)
Hemoglobin: 12.2 g/dL (ref 11.1–15.9)
MCH: 29.1 pg (ref 26.6–33.0)
MCHC: 33.3 g/dL (ref 31.5–35.7)
MCV: 87 fL (ref 79–97)
Platelets: 196 10*3/uL (ref 150–450)
RBC: 4.19 x10E6/uL (ref 3.77–5.28)
RDW: 12 % (ref 11.7–15.4)
WBC: 7.8 10*3/uL (ref 3.4–10.8)

## 2020-03-23 LAB — GLUCOSE TOLERANCE, 2 HOURS W/ 1HR
Glucose, 1 hour: 145 mg/dL (ref 65–179)
Glucose, 2 hour: 102 mg/dL (ref 65–152)
Glucose, Fasting: 82 mg/dL (ref 65–91)

## 2020-03-23 LAB — RPR: RPR Ser Ql: NONREACTIVE

## 2020-03-23 LAB — HIV ANTIBODY (ROUTINE TESTING W REFLEX): HIV Screen 4th Generation wRfx: NONREACTIVE

## 2020-04-05 ENCOUNTER — Telehealth (INDEPENDENT_AMBULATORY_CARE_PROVIDER_SITE_OTHER): Payer: Medicaid Other | Admitting: Women's Health

## 2020-04-05 VITALS — BP 115/70 | HR 55

## 2020-04-05 DIAGNOSIS — O99333 Smoking (tobacco) complicating pregnancy, third trimester: Secondary | ICD-10-CM | POA: Insufficient documentation

## 2020-04-05 DIAGNOSIS — Z3A29 29 weeks gestation of pregnancy: Secondary | ICD-10-CM

## 2020-04-05 DIAGNOSIS — Z6791 Unspecified blood type, Rh negative: Secondary | ICD-10-CM

## 2020-04-05 DIAGNOSIS — E669 Obesity, unspecified: Secondary | ICD-10-CM

## 2020-04-05 DIAGNOSIS — Z720 Tobacco use: Secondary | ICD-10-CM

## 2020-04-05 DIAGNOSIS — O99210 Obesity complicating pregnancy, unspecified trimester: Secondary | ICD-10-CM | POA: Insufficient documentation

## 2020-04-05 DIAGNOSIS — O99213 Obesity complicating pregnancy, third trimester: Secondary | ICD-10-CM

## 2020-04-05 DIAGNOSIS — O26893 Other specified pregnancy related conditions, third trimester: Secondary | ICD-10-CM

## 2020-04-05 DIAGNOSIS — Z3480 Encounter for supervision of other normal pregnancy, unspecified trimester: Secondary | ICD-10-CM

## 2020-04-05 NOTE — Patient Instructions (Signed)
Maternity Assessment Unit (MAU)  The Maternity Assessment Unit (MAU) is located at the [facility] at [facility]. The address is: [address] [place], [place], [place] [address]. Please see map below for additional directions.    The Maternity Assessment Unit is designed to help you during your pregnancy, and for up to 6 weeks after delivery, with any pregnancy- or postpartum-related emergencies, if you think you are in labor, or if your water has broken. For example, if you experience nausea and vomiting, vaginal bleeding, severe abdominal or pelvic pain, elevated blood pressure or other problems related to your pregnancy or postpartum time, please come to the Maternity Assessment Unit for assistance.        Third Trimester of Pregnancy The third trimester is from week 28 through week 40 (months 7 through 9). The third trimester is a time when the unborn baby (fetus) is growing rapidly. At the end of the ninth month, the fetus is about 20 inches in length and weighs 6-10 pounds. Body changes during your third trimester Your body will continue to go through many changes during pregnancy. The changes vary from woman to woman. During the third trimester:  Your weight will continue to increase. You can expect to gain 25-35 pounds (11-16 kg) by the end of the pregnancy.  You may begin to get stretch marks on your hips, abdomen, and breasts.  You may urinate more often because the fetus is moving lower into your pelvis and pressing on your bladder.  You may develop or continue to have heartburn. This is caused by increased hormones that slow down muscles in the digestive tract.  You may develop or continue to have constipation because increased hormones slow digestion and cause the muscles that push waste through your intestines to relax.  You may develop hemorrhoids. These are swollen veins (varicose veins) in the rectum that can itch or be  painful.  You may develop swollen, bulging veins (varicose veins) in your legs.  You may have increased body aches in the pelvis, back, or thighs. This is due to weight gain and increased hormones that are relaxing your joints.  You may have changes in your hair. These can include thickening of your hair, rapid growth, and changes in texture. Some women also have hair loss during or after pregnancy, or hair that feels dry or thin. Your hair will most likely return to normal after your baby is born.  Your breasts will continue to grow and they will continue to become tender. A yellow fluid (colostrum) may leak from your breasts. This is the first milk you are producing for your baby.  Your belly button may stick out.  You may notice more swelling in your hands, face, or ankles.  You may have increased tingling or numbness in your hands, arms, and legs. The skin on your belly may also feel numb.  You may feel short of breath because of your expanding uterus.  You may have more problems sleeping. This can be caused by the size of your belly, increased need to urinate, and an increase in your body's metabolism.  You may notice the fetus "dropping," or moving lower in your abdomen (lightening).  You may have increased vaginal discharge.  You may notice your joints feel loose and you may have pain around your pelvic bone. What to expect at prenatal visits You will have prenatal exams every 2 weeks until week 36. Then you will have weekly prenatal exams.  During a routine prenatal visit:  You will be weighed to make sure you and the baby are growing normally.  Your blood pressure will be taken.  Your abdomen will be measured to track your baby's growth.  The fetal heartbeat will be listened to.  Any test results from the previous visit will be discussed.  You may have a cervical check near your due date to see if your cervix has softened or thinned (effaced).  You will be tested for  Group B streptococcus. This happens between 35 and 37 weeks. Your health care provider may ask you:  What your birth plan is.  How you are feeling.  If you are feeling the baby move.  If you have had any abnormal symptoms, such as leaking fluid, bleeding, severe headaches, or abdominal cramping.  If you are using any tobacco products, including cigarettes, chewing tobacco, and electronic cigarettes.  If you have any questions. Other tests or screenings that may be performed during your third trimester include:  Blood tests that check for low iron levels (anemia).  Fetal testing to check the health, activity level, and growth of the fetus. Testing is done if you have certain medical conditions or if there are problems during the pregnancy.  Nonstress test (NST). This test checks the health of your baby to make sure there are no signs of problems, such as the baby not getting enough oxygen. During this test, a belt is placed around your belly. The baby is made to move, and its heart rate is monitored during movement. What is false labor? False labor is a condition in which you feel small, irregular tightenings of the muscles in the womb (contractions) that usually go away with rest, changing position, or drinking water. These are called Braxton Hicks contractions. Contractions may last for hours, days, or even weeks before true labor sets in. If contractions come at regular intervals, become more frequent, increase in intensity, or become painful, you should see your health care provider. What are the signs of labor?  Abdominal cramps.  Regular contractions that start at 10 minutes apart and become stronger and more frequent with time.  Contractions that start on the top of the uterus and spread down to the lower abdomen and back.  Increased pelvic pressure and dull back pain.  A watery or bloody mucus discharge that comes from the vagina.  Leaking of amniotic fluid. This is also  known as your "water breaking." It could be a slow trickle or a gush. Let your health care provider know if it has a color or strange odor. If you have any of these signs, call your health care provider right away, even if it is before your due date. Follow these instructions at home: Medicines  Follow your health care provider's instructions regarding medicine use. Specific medicines may be either safe or unsafe to take during pregnancy.  Take a prenatal vitamin that contains at least 600 micrograms (mcg) of folic acid.  If you develop constipation, try taking a stool softener if your health care provider approves. Eating and drinking   Eat a balanced diet that includes fresh fruits and vegetables, whole grains, good sources of protein such as meat, eggs, or tofu, and low-fat dairy. Your health care provider will help you determine the amount of weight gain that is right for you.  Avoid raw meat and uncooked cheese. These carry germs that can cause birth defects in the baby.  If you have low calcium intake from  food, talk to your health care provider about whether you should take a daily calcium supplement.  Eat four or five small meals rather than three large meals a day.  Limit foods that are high in fat and processed sugars, such as fried and sweet foods.  To prevent constipation: ? Drink enough fluid to keep your urine clear or pale yellow. ? Eat foods that are high in fiber, such as fresh fruits and vegetables, whole grains, and beans. Activity  Exercise only as directed by your health care provider. Most women can continue their usual exercise routine during pregnancy. Try to exercise for 30 minutes at least 5 days a week. Stop exercising if you experience uterine contractions.  Avoid heavy lifting.  Do not exercise in extreme heat or humidity, or at high altitudes.  Wear low-heel, comfortable shoes.  Practice good posture.  You may continue to have sex unless your health  care provider tells you otherwise. Relieving pain and discomfort  Take frequent breaks and rest with your legs elevated if you have leg cramps or low back pain.  Take warm sitz baths to soothe any pain or discomfort caused by hemorrhoids. Use hemorrhoid cream if your health care provider approves.  Wear a good support bra to prevent discomfort from breast tenderness.  If you develop varicose veins: ? Wear support pantyhose or compression stockings as told by your healthcare provider. ? Elevate your feet for 15 minutes, 3-4 times a day. Prenatal care  Write down your questions. Take them to your prenatal visits.  Keep all your prenatal visits as told by your health care provider. This is important. Safety  Wear your seat belt at all times when driving.  Make a list of emergency phone numbers, including numbers for family, friends, the hospital, and police and fire departments. General instructions  Avoid cat litter boxes and soil used by cats. These carry germs that can cause birth defects in the baby. If you have a cat, ask someone to clean the litter box for you.  Do not travel far distances unless it is absolutely necessary and only with the approval of your health care provider.  Do not use hot tubs, steam rooms, or saunas.  Do not drink alcohol.  Do not use any products that contain nicotine or tobacco, such as cigarettes and e-cigarettes. If you need help quitting, ask your health care provider.  Do not use any medicinal herbs or unprescribed drugs. These chemicals affect the formation and growth of the baby.  Do not douche or use tampons or scented sanitary pads.  Do not cross your legs for long periods of time.  To prepare for the arrival of your baby: ? Take prenatal classes to understand, practice, and ask questions about labor and delivery. ? Make a trial run to the hospital. ? Visit the hospital and tour the maternity area. ? Arrange for maternity or paternity  leave through employers. ? Arrange for family and friends to take care of pets while you are in the hospital. ? Purchase a rear-facing car seat and make sure you know how to install it in your car. ? Pack your hospital bag. ? Prepare the baby's nursery. Make sure to remove all pillows and stuffed animals from the baby's crib to prevent suffocation.  Visit your dentist if you have not gone during your pregnancy. Use a soft toothbrush to brush your teeth and be gentle when you floss. Contact a health care provider if:  You are unsure if  you are in labor or if your water has broken.  You become dizzy.  You have mild pelvic cramps, pelvic pressure, or nagging pain in your abdominal area.  You have lower back pain.  You have persistent nausea, vomiting, or diarrhea.  You have an unusual or bad smelling vaginal discharge.  You have pain when you urinate. Get help right away if:  Your water breaks before 37 weeks.  You have regular contractions less than 5 minutes apart before 37 weeks.  You have a fever.  You are leaking fluid from your vagina.  You have spotting or bleeding from your vagina.  You have severe abdominal pain or cramping.  You have rapid weight loss or weight gain.  You have shortness of breath with chest pain.  You notice sudden or extreme swelling of your face, hands, ankles, feet, or legs.  Your baby makes fewer than 10 movements in 2 hours.  You have severe headaches that do not go away when you take medicine.  You have vision changes. Summary  The third trimester is from week 28 through week 40, months 7 through 9. The third trimester is a time when the unborn baby (fetus) is growing rapidly.  During the third trimester, your discomfort may increase as you and your baby continue to gain weight. You may have abdominal, leg, and back pain, sleeping problems, and an increased need to urinate.  During the third trimester your breasts will keep growing  and they will continue to become tender. A yellow fluid (colostrum) may leak from your breasts. This is the first milk you are producing for your baby.  False labor is a condition in which you feel small, irregular tightenings of the muscles in the womb (contractions) that eventually go away. These are called Braxton Hicks contractions. Contractions may last for hours, days, or even weeks before true labor sets in.  Signs of labor can include: abdominal cramps; regular contractions that start at 10 minutes apart and become stronger and more frequent with time; watery or bloody mucus discharge that comes from the vagina; increased pelvic pressure and dull back pain; and leaking of amniotic fluid. This information is not intended to replace advice given to you by your health care provider. Make sure you discuss any questions you have with your health care provider. Document Revised: 09/18/2018 Document Reviewed: 07/03/2016 Elsevier Patient Education  2020 [employer].        Preterm Labor and Birth Information  The normal length of a pregnancy is 39-41 weeks. Preterm labor is when labor starts before 37 completed weeks of pregnancy. What are the risk factors for preterm labor? Preterm labor is more likely to occur in women who:  Have certain infections during pregnancy such as a bladder infection, sexually transmitted infection, or infection inside the uterus (chorioamnionitis).  Have a shorter-than-normal cervix.  Have gone into preterm labor before.  Have had surgery on their cervix.  Are younger than age 53 or older than age 54.  Are African American.  Are pregnant with twins or multiple babies (multiple gestation).  Take street drugs or smoke while pregnant.  Do not gain enough weight while pregnant.  Became pregnant shortly after having been pregnant. What are the symptoms of preterm labor? Symptoms of preterm labor include:  Cramps similar to those that can happen  during a menstrual period. The cramps may happen with diarrhea.  Pain in the abdomen or lower back.  Regular uterine contractions that may feel like tightening  of the abdomen.  A feeling of increased pressure in the pelvis.  Increased watery or bloody mucus discharge from the vagina.  Water breaking (ruptured amniotic sac). Why is it important to recognize signs of preterm labor? It is important to recognize signs of preterm labor because babies who are born prematurely may not be fully developed. This can put them at an increased risk for:  Long-term (chronic) heart and lung problems.  Difficulty immediately after birth with regulating body systems, including blood sugar, body temperature, heart rate, and breathing rate.  Bleeding in the brain.  Cerebral palsy.  Learning difficulties.  Death. These risks are highest for babies who are born before 34 weeks of pregnancy. How is preterm labor treated? Treatment depends on the length of your pregnancy, your condition, and the health of your baby. It may involve:  Having a stitch (suture) placed in your cervix to prevent your cervix from opening too early (cerclage).  Taking or being given medicines, such as: ? Hormone medicines. These may be given early in pregnancy to help support the pregnancy. ? Medicine to stop contractions. ? Medicines to help mature the baby's lungs. These may be prescribed if the risk of delivery is high. ? Medicines to prevent your baby from developing cerebral palsy. If the labor happens before 34 weeks of pregnancy, you may need to stay in the hospital. What should I do if I think I am in preterm labor? If you think that you are going into preterm labor, call your health care provider right away. How can I prevent preterm labor in future pregnancies? To increase your chance of having a full-term pregnancy:  Do not use any tobacco products, such as cigarettes, chewing tobacco, and e-cigarettes. If you  need help quitting, ask your health care provider.  Do not use street drugs or medicines that have not been prescribed to you during your pregnancy.  Talk with your health care provider before taking any herbal supplements, even if you have been taking them regularly.  Make sure you gain a healthy amount of weight during your pregnancy.  Watch for infection. If you think that you might have an infection, get it checked right away.  Make sure to tell your health care provider if you have gone into preterm labor before. This information is not intended to replace advice given to you by your health care provider. Make sure you discuss any questions you have with your health care provider. Document Revised: 09/19/2018 Document Reviewed: 10/19/2015 Elsevier Patient Education  2020 [employer].        Pregnancy and Smoking Smoking during pregnancy is unhealthy for you and your baby. Smoke from cigarettes, e-cigarettes, pipes, and cigars contains many chemicals that can cause cancer (carcinogens). These products also contain a stimulant drug (nicotine). When you smoke, harmful substances that you breathe in enter your bloodstream and can be passed on to your baby. This can affect your baby's development. If you are planning to become pregnant or have recently become pregnant, talk with your health care provider about quitting smoking. You have a much better chance of having a healthy pregnancy and a healthy baby if you do not smoke while you are pregnant. How does smoking affect me? Smoking increases your risk for many long-term (chronic) diseases. These diseases include cancer, lung diseases, and heart disease. Smoking during pregnancy increases your risk of:  Losing the pregnancy (miscarriage or stillbirth).  Giving birth too early (premature birth).  Pregnancy outside of the uterus (  tubal pregnancy).  Problems with the placenta, which is the organ that provides the baby nourishment and  oxygen. These problems may include: ? Attachment of the placenta over the opening of the uterus (placenta previa). ? Detachment of the placenta before the baby's birth (placental abruption).  Having your water break before labor begins. How does smoking affect my baby? Before birth Smoking during pregnancy:  Decreases blood flow and oxygen to your baby.  Increases your baby's risk of birth defects, such as heart defects.  Increases your baby's heart rate.  Slows your baby's growth in the uterus (intrauterine growth retardation). After birth Babies born to women who smoked during pregnancy may:  Have symptoms of nicotine withdrawal.  Be born with a cleft lip, cleft palate, or other facial deformities.  Be too small at birth.  Have a high risk of: ? Serious health problems or lifelong disabilities. These may result in the long-term need for certain medicines, therapies, or other treatments. ? Sudden infant death syndrome (SIDS). Follow these instructions at home:   Do not use any products that contain nicotine or tobacco, such as cigarettes, e-cigarettes, and chewing tobacco. If you need help quitting, ask your health care provider.  Talk with your health care provider about support strategies to quit smoking. Some methods to consider include: ? Counseling (smoking cessation counseling). ? Psychotherapy. ? Acupuncture. ? Hypnosis. ? Telephone hotlines for people trying to quit.  Do not take nicotine supplements or medicine to help you quit smoking unless your health care provider tells you to do so.  Avoid secondhand smoke. Ask people who smoke to avoid smoking around you.  Identify people, places, things, and activities that make you want to smoke (triggers). Avoid them. Where to find more information Learn more about smoking during pregnancy and quitting smoking from:  March of Dimes: www.marchofdimes.org  U.S. Department of Health and Human Services:  women.smokefree.gov  American Cancer Society: www.cancer.org  American Heart Association: www.heart.org  National Cancer Institute: www.cancer.gov For help to quit smoking:  National smoking cessation telephone hotline: 1-800-QUIT NOW 223-166-5815(323-777-8340) Contact a health care provider if:  You are struggling to quit smoking.  You are a smoker and you become pregnant or plan to become pregnant.  You start smoking again after giving birth. Summary  Smoking during pregnancy is unhealthy for you and your baby.  Tobacco smoke contains harmful substances that can affect a baby's health and development.  Smoking increases the risk for serious problems, such as miscarriage, birth defects, or premature birth.  If you need help to quit smoking, talk to your health care provider and ask about support strategies such as counseling. This information is not intended to replace advice given to you by your health care provider. Make sure you discuss any questions you have with your health care provider. Document Revised: 12/26/2018 Document Reviewed: 12/26/2018 Elsevier Patient Education  2020 [employer]        Breastfeeding and Smoking It is not safe to smoke when you are breastfeeding. Smoking during breastfeeding is harmful to you and to your baby in many ways. When you smoke during breastfeeding, nicotine and other harmful (toxic) substances pass through your milk to your baby. When you smoke, your baby is also exposed to cigarette smoke (secondhand smoke) and to surfaces contaminated with the harmful substances in cigarette smoke (thirdhand smoke). The dangers of smoking during breastfeeding apply to using e-cigarettes as well. These also expose your baby to nicotine and other toxic substances. If you are  smoking and breastfeeding, do not stop breastfeeding. Breast milk is still the best food for your baby. Take steps to quit smoking. How does this affect me? The effects of smoking are well  known. When you smoke, you increase your risk of:  Early death.  Cancer.  Heart disease.  Lung disease.  Stroke.  Eye disease.  Diabetes.  Rheumatoid arthritis.  Osteoporosis. Smoking also affects your ability to breastfeed successfully. Smoking lowers a chemical messenger (a hormone called prolactin) in your body that is important for stimulating the production of breast milk. Smoking during breastfeeding may lead to:  Having trouble breastfeeding.  Stopping breastfeeding early.  Producing breast milk that smells and tastes like a cigarette.  Producing breast milk that has a lower fat content. How does this affect my baby? When you smoke, nicotine and other toxic substances pass through your breast milk directly to your baby. This can affect the development of your baby's brain and body. It can also cause:  Restlessness.  Increased heart rate.  Colic.  Difficulty sleeping.  Sudden infant death syndrome (SIDS).  Difficulty growing. When you smoke or someone else smokes around your baby, your baby is also exposed to secondhand smoke. The effects of secondhand smoke on babies can include:  SIDS.  Ear infections.  Frequent colds.  Pneumonia.  Bronchitis.  Poor lung development.  Frequent asthma attacks, if your child has asthma. When you or other people smoke in your home, toxic substances from smoke can gradually build up in your hair and clothing as well as in fabric on furniture and drapes. Your baby can be exposed to these substances after touching contaminated surfaces (thirdhand smoke exposure). This may be especially risky for babies because babies put their fingers into their mouth often. A baby's developing brain is also more sensitive to toxins. Follow these instructions at home:   Do not use any products that contain nicotine or tobacco, such as cigarettes and e-cigarettes. If you need help quitting, ask your health care provider.  Take  over-the-counter and prescription medicines only as told by your health care provider.  Do not let anyone smoke in your house.  Keep all follow-up visits as told by your health care provider. This is important.  If you are still smoking: ? Do not stop breastfeeding. ? Cut back the amount you smoke and continue trying to stop smoking. ? Smoke outside. ? Smoke after you breastfeed your baby, not before. ? Wash your hands and change your clothes before breastfeeding. Where to find more information  For information on how to quit smoking, go to the smokefree.gov website.  For more information on how smoking affects breastfeeding, turn to the Centers for Disease Control and Prevention: DemocraticPeople.com.cy Contact a health care provider if:  You are smoking while breastfeeding.  You are struggling to stop smoking.  You are having trouble breastfeeding.  Your baby is restless and has trouble sleeping.  Your baby has a fever, cough, or congestion.  Your baby is not gaining weight. Summary  Smoking when you are breastfeeding is dangerous for you and for your baby.  Your baby may be affected by nicotine and toxic substances in your breast milk, secondhand smoke, and thirdhand smoke.  Ask your health care provider for help if you are struggling to stop smoking.  Do not stop breastfeeding. Stop smoking. This information is not intended to replace advice given to you by your health care provider. Make sure you discuss any questions you have with your  health care provider. Document Revised: 03/24/2019 Document Reviewed: 02/13/2017 Elsevier Patient Education  2020 [employer].

## 2020-04-05 NOTE — Progress Notes (Signed)
I connected with  Cassandra Harrison on 04/05/20 by a video enabled telemedicine application and verified that I am speaking with the correct person using two identifiers.   Patient is at home and Provider is at [facility]  I discussed the limitations of evaluation and management by telemedicine. The patient expressed understanding and agreed to proceed.  MyChart OB, reports no problems today.

## 2020-04-05 NOTE — Progress Notes (Signed)
I connected with Cassandra Harrison 04/05/20 at  9:55 AM EDT by: MyChart video and verified that I am speaking with the correct person using two identifiers.  Patient is located at home and provider is located at [facility].     The purpose of this virtual visit is to provide medical care while limiting exposure to the novel coronavirus. I discussed the limitations, risks, security and privacy concerns of performing an evaluation and management service by MyChart video and the availability of in person appointments. I also discussed with the patient that there may be a patient responsible charge related to this service. By engaging in this virtual visit, you consent to the provision of healthcare.  Additionally, you authorize for your insurance to be billed for the services provided during this visit.  The patient expressed understanding and agreed to proceed.  The following staff members participated in the virtual visit:  Donia Ast    PRENATAL VISIT NOTE  Subjective:  Cassandra Harrison is a 29 y.o. G2P1001 at [redacted]w[redacted]d  for phone visit for ongoing prenatal care.  She is currently monitored for the following issues for this high-risk pregnancy and has Supervision of other normal pregnancy, antepartum; Rh negative state in antepartum period; Obesity in pregnancy; and High risk pregnancy due to smoking in third trimester on their problem list.  Patient reports no complaints.  Contractions: Not present. Vag. Bleeding: None.  Movement: Present. Denies leaking of fluid.   The following portions of the patient's history were reviewed and updated as appropriate: allergies, current medications, past family history, past medical history, past social history, past surgical history and problem list.   Objective:   Vitals:   04/05/20 0856  BP: 115/70  Pulse: (!) 55   Self-Obtained  Fetal Status:     Movement: Present     Assessment and Plan:  Pregnancy: G2P1001 at [redacted]w[redacted]d 1. Supervision of other  normal pregnancy, antepartum -discussed with patient that we will not know who will be delivering her baby unless she is scheduled for an induction  2. Rh negative state in antepartum period -did not have antibody screen with 28 week labs, will order and schedule -RhoGAM 03/22/2020  3. Obesity in pregnancy -f/u growth [place] 04/25/2020  4. High risk pregnancy due to smoking in third trimester -5-10 cigarettes per day "depending on stress level" -declines BH consultation d/t time constraints  5. [redacted] weeks gestation of pregnancy  Preterm labor symptoms and general obstetric precautions including but not limited to vaginal bleeding, contractions, leaking of fluid and fetal movement were reviewed in detail with the patient. I discussed the assessment and treatment plan with the patient. The patient was provided an opportunity to ask questions and all were answered. The patient agreed with the plan and demonstrated an understanding of the instructions. The patient was advised to call back or seek an in-person office evaluation/go to MAU at [facility] for any urgent or concerning symptoms.  Return in about 2 weeks (around 04/19/2020) for virtual LOB/APP OK.  Future Appointments  Date Time Provider Department Center  04/05/2020  9:55 AM Yul[facility] [place], Odie Sera, NP [facility] None  04/25/2020  9:30 AM [facility] NURSE [facility]  04/25/2020  9:45 AM [facility] US5 [facility]     Time spent on virtual visit: 10 minutes  Marylen Ponto, NP

## 2020-04-19 ENCOUNTER — Encounter: Payer: Self-pay | Admitting: Obstetrics

## 2020-04-19 ENCOUNTER — Telehealth (INDEPENDENT_AMBULATORY_CARE_PROVIDER_SITE_OTHER): Payer: Medicaid Other | Admitting: Obstetrics

## 2020-04-19 VITALS — BP 123/71 | HR 89

## 2020-04-19 DIAGNOSIS — O26899 Other specified pregnancy related conditions, unspecified trimester: Secondary | ICD-10-CM

## 2020-04-19 DIAGNOSIS — O99330 Smoking (tobacco) complicating pregnancy, unspecified trimester: Secondary | ICD-10-CM

## 2020-04-19 DIAGNOSIS — M549 Dorsalgia, unspecified: Secondary | ICD-10-CM

## 2020-04-19 DIAGNOSIS — Z3480 Encounter for supervision of other normal pregnancy, unspecified trimester: Secondary | ICD-10-CM

## 2020-04-19 DIAGNOSIS — O99213 Obesity complicating pregnancy, third trimester: Secondary | ICD-10-CM

## 2020-04-19 DIAGNOSIS — Z720 Tobacco use: Secondary | ICD-10-CM

## 2020-04-19 DIAGNOSIS — O99210 Obesity complicating pregnancy, unspecified trimester: Secondary | ICD-10-CM

## 2020-04-19 DIAGNOSIS — O26893 Other specified pregnancy related conditions, third trimester: Secondary | ICD-10-CM

## 2020-04-19 DIAGNOSIS — Z3A31 31 weeks gestation of pregnancy: Secondary | ICD-10-CM

## 2020-04-19 DIAGNOSIS — Z6791 Unspecified blood type, Rh negative: Secondary | ICD-10-CM

## 2020-04-19 MED ORDER — COMFORT FIT MATERNITY SUPP SM MISC: 0 refills | Status: DC

## 2020-04-19 NOTE — Addendum Note (Signed)
Addended by: Coral Ceo A on: 04/19/2020 11:38 AM   Modules accepted: Orders

## 2020-04-19 NOTE — Progress Notes (Signed)
Pt is having some pelvic pressure.

## 2020-04-19 NOTE — Progress Notes (Addendum)
   OBSTETRICS PRENATAL VIRTUAL VISIT ENCOUNTER NOTE  Provider location: Center for [facility] [place]   I connected with Cassandra Harrison on 04/19/20 at 10:00 AM EST by MyChart Video Encounter at home and verified that I am speaking with the correct person using two identifiers.   I discussed the limitations, risks, security and privacy concerns of performing an evaluation and management service virtually and the availability of in person appointments. I also discussed with the patient that there may be a patient responsible charge related to this service. The patient expressed understanding and agreed to proceed. Subjective:  Cassandra Harrison is a 29 y.o. G2P1001 at [redacted]w[redacted]d being seen today for ongoing prenatal care.  She is currently monitored for the following issues for this low-risk pregnancy and has Supervision of other normal pregnancy, antepartum; Rh negative state in antepartum period; Obesity in pregnancy; and High risk pregnancy due to smoking in third trimester on their problem list.  Patient reports backache and heartburn.  Contractions: Not present. Vag. Bleeding: None.  Movement: Present. Denies any leaking of fluid.   The following portions of the patient's history were reviewed and updated as appropriate: allergies, current medications, past family history, past medical history, past social history, past surgical history and problem list.   Objective:   Vitals:   04/19/20 1004  BP: 123/71  Pulse: 89    Fetal Status:     Movement: Present     General:  Alert, oriented and cooperative. Patient is in no acute distress.  Respiratory: Normal respiratory effort, no problems with respiration noted  Mental Status: Normal mood and affect. Normal behavior. Normal judgment and thought content.  Rest of physical exam deferred due to type of encounter  Imaging: No results found.  Assessment and Plan:  Pregnancy: G2P1001 at [redacted]w[redacted]d  1. Supervision of other normal  pregnancy, antepartum  2. Rh negative state in antepartum period - Rhogam postpartum  3. Obesity in pregnancy  4. Tobacco smoking affecting pregnancy, antepartum - cessation recommended  5. Backache symptom Rx: - Elastic Bandages & Supports (COMFORT FIT MATERNITY SUPP SM) MISC; Wear as directed.  Dispense: 1 each; Refill: 0   Preterm labor symptoms and general obstetric precautions including but not limited to vaginal bleeding, contractions, leaking of fluid and fetal movement were reviewed in detail with the patient. I discussed the assessment and treatment plan with the patient. The patient was provided an opportunity to ask questions and all were answered. The patient agreed with the plan and demonstrated an understanding of the instructions. The patient was advised to call back or seek an in-person office evaluation/go to MAU at [facility] for any urgent or concerning symptoms. Please refer to After Visit Summary for other counseling recommendations.   I provided 15 minutes of face-to-face time during this encounter.  Return in about 2 weeks (around 05/03/2020) for MyChart.  Future Appointments  Date Time Provider Department Center  04/25/2020  9:30 AM [facility] NURSE [facility]  04/25/2020  9:45 AM [facility] US5 [facility]    Coral Ceo, MD Center for [facility] 04/19/20

## 2020-04-20 ENCOUNTER — Telehealth: Payer: Self-pay

## 2020-04-20 NOTE — Telephone Encounter (Signed)
Received message from on-call nurse. She reports her baby has not been moving over the last couple of days. She has a cold and wanted to know if robitussin dm would be ok to take. I have advised her to take plain robitussin and try drinking lots of cold water to see if baby will start to be more active.If the baby does not start to move she should go directly to [facility] for follow up care.I

## 2020-04-22 ENCOUNTER — Inpatient Hospital Stay (HOSPITAL_COMMUNITY)
Admission: AD | Admit: 2020-04-22 | Discharge: 2020-04-22 | Disposition: A | Discharge status: Home / Self Care | Payer: Medicaid Other | Attending: Obstetrics and Gynecology | Admitting: Obstetrics and Gynecology

## 2020-04-22 ENCOUNTER — Encounter (HOSPITAL_COMMUNITY): Payer: Self-pay | Admitting: Obstetrics and Gynecology

## 2020-04-22 ENCOUNTER — Inpatient Hospital Stay (HOSPITAL_COMMUNITY): Payer: Medicaid Other

## 2020-04-22 ENCOUNTER — Other Ambulatory Visit: Payer: Self-pay

## 2020-04-22 DIAGNOSIS — O368130 Decreased fetal movements, third trimester, not applicable or unspecified: Secondary | ICD-10-CM | POA: Insufficient documentation

## 2020-04-22 DIAGNOSIS — Z20822 Contact with and (suspected) exposure to covid-19: Secondary | ICD-10-CM | POA: Diagnosis not present

## 2020-04-22 DIAGNOSIS — J45901 Unspecified asthma with (acute) exacerbation: Secondary | ICD-10-CM | POA: Insufficient documentation

## 2020-04-22 DIAGNOSIS — O99333 Smoking (tobacco) complicating pregnancy, third trimester: Secondary | ICD-10-CM | POA: Insufficient documentation

## 2020-04-22 DIAGNOSIS — J069 Acute upper respiratory infection, unspecified: Secondary | ICD-10-CM

## 2020-04-22 DIAGNOSIS — O99513 Diseases of the respiratory system complicating pregnancy, third trimester: Secondary | ICD-10-CM | POA: Insufficient documentation

## 2020-04-22 DIAGNOSIS — F17210 Nicotine dependence, cigarettes, uncomplicated: Secondary | ICD-10-CM | POA: Insufficient documentation

## 2020-04-22 DIAGNOSIS — Z3A31 31 weeks gestation of pregnancy: Secondary | ICD-10-CM | POA: Insufficient documentation

## 2020-04-22 DIAGNOSIS — Z3689 Encounter for other specified antenatal screening: Secondary | ICD-10-CM

## 2020-04-22 DIAGNOSIS — Z79899 Other long term (current) drug therapy: Secondary | ICD-10-CM | POA: Insufficient documentation

## 2020-04-22 LAB — URINALYSIS, ROUTINE W REFLEX MICROSCOPIC
Bilirubin Urine: NEGATIVE
Glucose, UA: NEGATIVE mg/dL
Hgb urine dipstick: NEGATIVE
Ketones, ur: NEGATIVE mg/dL
Leukocytes,Ua: NEGATIVE
Nitrite: NEGATIVE
Protein, ur: NEGATIVE mg/dL
Specific Gravity, Urine: 1.024 (ref 1.005–1.030)
pH: 5 (ref 5.0–8.0)

## 2020-04-22 LAB — RESPIRATORY PANEL BY RT PCR (FLU A&B, COVID)
Influenza A by PCR: NEGATIVE
Influenza B by PCR: NEGATIVE
SARS Coronavirus 2 by RT PCR: NEGATIVE

## 2020-04-22 MED ORDER — GUAIFENESIN ER 600 MG PO TB12: 600.0000 mg | ORAL_TABLET | Freq: Two times a day (BID) | ORAL | 0 refills | Status: DC

## 2020-04-22 MED ORDER — ALBUTEROL SULFATE HFA 108 (90 BASE) MCG/ACT IN AERS: 1.0000 | INHALATION_SPRAY | Freq: Four times a day (QID) | RESPIRATORY_TRACT | 0 refills | Status: DC | PRN

## 2020-04-22 MED ORDER — ALBUTEROL SULFATE HFA 108 (90 BASE) MCG/ACT IN AERS
1.0000 | INHALATION_SPRAY | Freq: Four times a day (QID) | RESPIRATORY_TRACT | Status: DC
  Administered 2020-04-22: 2 via RESPIRATORY_TRACT
  Filled 2020-04-22: qty 6.7

## 2020-04-22 NOTE — MAU Note (Addendum)
Pt reports decreased fetal movement over past few days - no movement felt since 1600-1700 last night. Pt reports no VB or LOF. Some abdominal pain, but not cntx.   Also has upper respiratory symptoms - cough, congestion, headache, chest tightness, and fatigue. Has not had any Covid contacts. She also feels like she's "sweating bad, but not running a fever." Is taking Tussin DM.    Pt is smoker, but hasn't smoked since feeling sick. Does not have inhaler currently,  but used it in past w/these symptoms which pt states  are similar to bronchitis she's had in the past.

## 2020-04-22 NOTE — MAU Provider Note (Signed)
History     CSN: [id number]  Arrival date and time: 04/22/20 1306   First Provider Initiated Contact with Patient 04/22/20 1425      Chief Complaint  Patient presents with  . Decreased Fetal Movement   29 y.o. G2P1001 _0 .6 wks presenting with decreased FM. Reports no FM since last night. She also reports cough, congestion, and chest tightness x3 days. Denies fever. Denies loss of taste or smell. Denies Covid exposure. She is not vaccinated. No SOB. She has not been smoking since she started coughing. Has asthma but does not have an inhaler.   OB History    Gravida  2   Para  1   Term  1   Preterm  0   AB  0   Living  1     SAB  0   TAB  0   Ectopic  0   Multiple  0   Live Births  1           Past Medical History:  Diagnosis Date  . Asthma    last used 11/29/11  . Bulging disc   . Chronic back pain   . Constipation   . Migraine     Past Surgical History:  Procedure Laterality Date  . WISDOM TOOTH EXTRACTION      Family History  Problem Relation Age of Onset  . Diabetes Mother   . COPD Mother   . Heart disease Mother   . Cancer Mother        throat cancer  . COPD Father   . Heart disease Father   . Cancer Sister   . Cancer Paternal Aunt   . Diabetes Maternal Grandmother   . Cancer Maternal Grandmother   . COPD Paternal Grandmother   . Heart disease Paternal Grandfather   . Other Neg Hx     Social History   Tobacco Use  . Smoking status: Current Every Day Smoker    Packs/day: 0.25  . Smokeless tobacco: Never Used  Vaping Use  . Vaping Use: Never used  Substance Use Topics  . Alcohol use: Not Currently    Comment: occ  . Drug use: No    Allergies:  Allergies  Allergen Reactions  . Olive Oil Nausea And Vomiting    She can not have anything with olives in it  . Latex Rash    Patient states that she is allergic to latex condoms.  They give her a rash and a yeast infection.    Medications Prior to Admission  Medication Sig  Dispense Refill Last Dose  . aspirin 81 MG chewable tablet Chew 1 tablet (81 mg total) by mouth daily. 30 tablet 5 Past Week at Unknown time  . calcium carbonate (TUMS EX) 750 MG chewable tablet Chew 1 tablet by mouth daily.   Past Week at Unknown time  . Elastic Bandages & Supports (COMFORT FIT MATERNITY SUPP SM) MISC Wear as directed. 1 each 0 Past Week at Unknown time  . guaiFENesin-dextromethorphan (ROBITUSSIN DM) 100-10 MG/5ML syrup Take 5 mLs by mouth every 4 (four) hours as needed for cough.   04/22/2020 at Unknown time  . Prenatal MV-Min-FA-Omega-3 (PRENATAL GUMMIES/DHA & FA) 0.4-32.5 MG CHEW Chew 3 tablets by mouth daily. 90 tablet 12 Past Week at Unknown time  . albuterol (PROVENTIL HFA;VENTOLIN HFA) 108 (90 Base) MCG/ACT inhaler Inhale 1-2 puffs into the lungs every 6 (six) hours as needed for wheezing or shortness of breath. 1 Inhaler 2   .  Blood Pressure Monitoring (BLOOD PRESSURE KIT) DEVI 1 kit by Does not apply route as needed. 1 each 0   . docusate sodium (COLACE) 100 MG capsule Take 1 capsule (100 mg total) by mouth 2 (two) times daily. 60 capsule 5 Unknown at Unknown time  . ferrous sulfate 325 (65 FE) MG tablet Take 1 tablet (325 mg total) by mouth 2 (two) times daily with a meal. 60 tablet 5     Review of Systems  Constitutional: Negative for fever.  HENT: Positive for congestion and ear pain. Negative for sore throat.   Respiratory: Positive for cough. Negative for shortness of breath.   Cardiovascular: Negative for chest pain.  Gastrointestinal: Negative for abdominal pain.  Genitourinary: Negative for vaginal bleeding.   Physical Exam   Blood pressure 112/62, pulse 82, temperature 98.4 F (36.9 C), temperature source Oral, resp. rate 18, height 5' 2.25" (1.581 m), weight 102.8 kg, last menstrual period 09/21/2019, SpO2 98 %.  Physical Exam Vitals and nursing note reviewed.  Constitutional:      General: She is not in acute distress.    Appearance: Normal  appearance.  HENT:     Head: Normocephalic and atraumatic.  Cardiovascular:     Rate and Rhythm: Normal rate and regular rhythm.     Heart sounds: Normal heart sounds.  Pulmonary:     Effort: Pulmonary effort is normal. No respiratory distress.     Breath sounds: Examination of the left-lower field reveals wheezing. Wheezing present. No rhonchi or rales.  Abdominal:     Palpations: Abdomen is soft.     Tenderness: There is no abdominal tenderness.  Musculoskeletal:        General: Normal range of motion.     Cervical back: Normal range of motion.  Skin:    General: Skin is warm and dry.  Neurological:     General: No focal deficit present.     Mental Status: She is alert and oriented to person, place, and time.  Psychiatric:        Mood and Affect: Mood normal.        Behavior: Behavior normal.   EFM: 135 bpm, mod variability, + accels, no decels Toco: none  Results for orders placed or performed during the hospital encounter of 04/22/20 (from the past 24 hour(s))  Respiratory Panel by RT PCR (Flu A&B, Covid) - Nasopharyngeal Swab     Status: None   Collection Time: 04/22/20  1:51 PM   Specimen: Nasopharyngeal Swab  Result Value Ref Range   SARS Coronavirus 2 by RT PCR NEGATIVE NEGATIVE   Influenza A by PCR NEGATIVE NEGATIVE   Influenza B by PCR NEGATIVE NEGATIVE  Urinalysis, Routine w reflex microscopic Nasopharyngeal Swab     Status: Abnormal   Collection Time: 04/22/20  1:51 PM  Result Value Ref Range   Color, Urine YELLOW YELLOW   APPearance HAZY (A) CLEAR   Specific Gravity, Urine 1.024 1.005 - 1.030   pH 5.0 5.0 - 8.0   Glucose, UA NEGATIVE NEGATIVE mg/dL   Hgb urine dipstick NEGATIVE NEGATIVE   Bilirubin Urine NEGATIVE NEGATIVE   Ketones, ur NEGATIVE NEGATIVE mg/dL   Protein, ur NEGATIVE NEGATIVE mg/dL   Nitrite NEGATIVE NEGATIVE   Leukocytes,Ua NEGATIVE NEGATIVE   DG Chest 1 View  Result Date: 04/22/2020 CLINICAL DATA:  Cough and chest tightness EXAM:  CHEST  1 VIEW COMPARISON:  Oct 21, 2017 FINDINGS: The lungs are clear. Heart size and pulmonary vascularity are normal. No adenopathy.  No pneumothorax. No bone lesions. IMPRESSION: The lungs are clear. The cardiac silhouette is within normal limits. Electronically Signed   By: Lowella Grip III M.D.   On: 04/22/2020 14:53   MAU Course  Procedures  MDM Labs and CXR ordered and reviewed. No evidence of Covid or pneumonia. Pt feeling good FM and NST is reactive. Recommend supportive measures for upper respiratory virus. Recommend Covid vaccine, discussed risk/benefits, pt declines. Stable for discharge home.   Assessment and Plan   1. [redacted] weeks gestation of pregnancy   2. NST (non-stress test) reactive   3. Upper respiratory virus   4. Mild asthma with exacerbation, unspecified whether persistent   5. Decreased fetal movements in third trimester, single or unspecified fetus    Discharge home Follow [facility] at [facility] as scheduled Rx Mucinex Continue OTC cough suppressant Rx Albuterol inhaler  Allergies as of 04/22/2020      Reactions   Olive Oil Nausea And Vomiting   She can not have anything with olives in it   Latex Rash   Patient states that she is allergic to latex condoms.  They give her a rash and a yeast infection.      Medication List    TAKE these medications   albuterol 108 (90 Base) MCG/ACT inhaler Commonly known as: VENTOLIN HFA Inhale 1-2 puffs into the lungs every 6 (six) hours as needed for wheezing or shortness of breath.   aspirin 81 MG chewable tablet Chew 1 tablet (81 mg total) by mouth daily.   Blood Pressure Kit Devi 1 kit by Does not apply route as needed.   calcium carbonate 750 MG chewable tablet Commonly known as: TUMS EX Chew 1 tablet by mouth daily.   [place] Supp Sm Misc Wear as directed.   docusate sodium 100 MG capsule Commonly known as: Colace Take 1 capsule (100 mg total) by mouth 2 (two) times daily.   ferrous sulfate 325  (65 FE) MG tablet Take 1 tablet (325 mg total) by mouth 2 (two) times daily with a meal.   guaiFENesin 600 MG 12 hr tablet Commonly known as: Mucinex Take 1 tablet (600 mg total) by mouth 2 (two) times daily.   guaiFENesin-dextromethorphan 100-10 MG/5ML syrup Commonly known as: ROBITUSSIN DM Take 5 mLs by mouth every 4 (four) hours as needed for cough.   Prenatal Gummies/DHA & FA 0.4-32.5 MG Chew Chew 3 tablets by mouth daily.      Julianne Handler, CNM 04/22/2020, 2:30 PM

## 2020-04-22 NOTE — Discharge Instructions (Signed)
Fetal Movement Counts Patient Name: ________________________________________________ Patient Due Date: ____________________ What is a fetal movement count?  A fetal movement count is the number of times that you feel your baby move during a certain amount of time. This may also be called a fetal kick count. A fetal movement count is recommended for every pregnant woman. You may be asked to start counting fetal movements as early as week 28 of your pregnancy. Pay attention to when your baby is most active. You may notice your baby's sleep and wake cycles. You may also notice things that make your baby move more. You should do a fetal movement count:  When your baby is normally most active.  At the same time each day. A good time to count movements is while you are resting, after having something to eat and drink. How do I count fetal movements? 1. Find a quiet, comfortable area. Sit, or lie down on your side. 2. Write down the date, the start time and stop time, and the number of movements that you felt between those two times. Take this information with you to your health care visits. 3. Write down your start time when you feel the first movement. 4. Count kicks, flutters, swishes, rolls, and jabs. You should feel at least 10 movements. 5. You may stop counting after you have felt 10 movements, or if you have been counting for 2 hours. Write down the stop time. 6. If you do not feel 10 movements in 2 hours, contact your health care provider for further instructions. Your health care provider may want to do additional tests to assess your baby's well-being. Contact a health care provider if:  You feel fewer than 10 movements in 2 hours.  Your baby is not moving like he or she usually does. Date: ____________ Start time: ____________ Stop time: ____________ Movements: ____________ Date: ____________ Start time: ____________ Stop time: ____________ Movements: ____________ Date: ____________  Start time: ____________ Stop time: ____________ Movements: ____________ Date: ____________ Start time: ____________ Stop time: ____________ Movements: ____________ Date: ____________ Start time: ____________ Stop time: ____________ Movements: ____________ Date: ____________ Start time: ____________ Stop time: ____________ Movements: ____________ Date: ____________ Start time: ____________ Stop time: ____________ Movements: ____________ Date: ____________ Start time: ____________ Stop time: ____________ Movements: ____________ Date: ____________ Start time: ____________ Stop time: ____________ Movements: ____________ This information is not intended to replace advice given to you by your health care provider. Make sure you discuss any questions you have with your health care provider. Document Revised: 01/15/2019 Document Reviewed: 01/15/2019 Elsevier Patient Education  2020 [employer]   Upper Respiratory Infection, Adult An upper respiratory infection (URI) affects the nose, throat, and upper air passages. URIs are caused by germs (viruses). The most common type of URI is often called "the common cold." Medicines cannot cure URIs, but you can do things at home to relieve your symptoms. URIs usually get better within 7-10 days. Follow these instructions at home: Activity  Rest as needed.  If you have a fever, stay home from work or school until your fever is gone, or until your doctor says you may return to work or school. ? You should stay home until you cannot spread the infection anymore (you are not contagious). ? Your doctor may have you wear a face mask so you have less risk of spreading the infection. Relieving symptoms  Gargle with a salt-water mixture 3-4 times a day or as needed. To make a salt-water mixture, completely dissolve -1 tsp of  salt in 1 cup of warm water.  Use a cool-mist humidifier to add moisture to the air. This can help you breathe more easily. Eating and  drinking   Drink enough fluid to keep your pee (urine) pale yellow.  Eat soups and other clear broths. General instructions   Take over-the-counter and prescription medicines only as told by your doctor. These include cold medicines, fever reducers, and cough suppressants.  Do not use any products that contain nicotine or tobacco. These include cigarettes and e-cigarettes. If you need help quitting, ask your doctor.  Avoid being where people are smoking (avoid secondhand smoke).  Make sure you get regular shots and get the flu shot every year.  Keep all follow-up visits as told by your doctor. This is important. How to avoid spreading infection to others   Wash your hands often with soap and water. If you do not have soap and water, use hand sanitizer.  Avoid touching your mouth, face, eyes, or nose.  Cough or sneeze into a tissue or your sleeve or elbow. Do not cough or sneeze into your hand or into the air. Contact a doctor if:  You are getting worse, not better.  You have any of these: ? A fever. ? Chills. ? Brown or red mucus in your nose. ? Yellow or brown fluid (discharge)coming from your nose. ? Pain in your face, especially when you bend forward. ? Swollen neck glands. ? Pain with swallowing. ? White areas in the back of your throat. Get help right away if:  You have shortness of breath that gets worse.  You have very bad or constant: ? Headache. ? Ear pain. ? Pain in your forehead, behind your eyes, and over your cheekbones (sinus pain). ? Chest pain.  You have long-lasting (chronic) lung disease along with any of these: ? Wheezing. ? Long-lasting cough. ? Coughing up blood. ? A change in your usual mucus.  You have a stiff neck.  You have changes in your: ? Vision. ? Hearing. ? Thinking. ? Mood. Summary  An upper respiratory infection (URI) is caused by a germ called a virus. The most common type of URI is often called "the common  cold."  URIs usually get better within 7-10 days.  Take over-the-counter and prescription medicines only as told by your doctor. This information is not intended to replace advice given to you by your health care provider. Make sure you discuss any questions you have with your health care provider. Document Revised: 06/05/2018 Document Reviewed: 01/18/2017 Elsevier Patient Education  2020 [employer].

## 2020-04-25 ENCOUNTER — Ambulatory Visit: Payer: Medicaid Other | Admitting: *Deleted

## 2020-04-25 ENCOUNTER — Ambulatory Visit: Payer: Medicaid Other | Attending: Obstetrics and Gynecology

## 2020-04-25 ENCOUNTER — Other Ambulatory Visit: Payer: Self-pay

## 2020-04-25 ENCOUNTER — Other Ambulatory Visit: Payer: Self-pay | Admitting: *Deleted

## 2020-04-25 ENCOUNTER — Encounter: Payer: Self-pay | Admitting: *Deleted

## 2020-04-25 DIAGNOSIS — O26899 Other specified pregnancy related conditions, unspecified trimester: Secondary | ICD-10-CM | POA: Insufficient documentation

## 2020-04-25 DIAGNOSIS — O99333 Smoking (tobacco) complicating pregnancy, third trimester: Secondary | ICD-10-CM

## 2020-04-25 DIAGNOSIS — Z3A32 32 weeks gestation of pregnancy: Secondary | ICD-10-CM

## 2020-04-25 DIAGNOSIS — J45909 Unspecified asthma, uncomplicated: Secondary | ICD-10-CM

## 2020-04-25 DIAGNOSIS — O99210 Obesity complicating pregnancy, unspecified trimester: Secondary | ICD-10-CM

## 2020-04-25 DIAGNOSIS — Z6791 Unspecified blood type, Rh negative: Secondary | ICD-10-CM

## 2020-04-25 DIAGNOSIS — Z362 Encounter for other antenatal screening follow-up: Secondary | ICD-10-CM | POA: Diagnosis not present

## 2020-04-25 DIAGNOSIS — R638 Other symptoms and signs concerning food and fluid intake: Secondary | ICD-10-CM

## 2020-04-25 DIAGNOSIS — E669 Obesity, unspecified: Secondary | ICD-10-CM | POA: Diagnosis not present

## 2020-04-25 DIAGNOSIS — F17200 Nicotine dependence, unspecified, uncomplicated: Secondary | ICD-10-CM | POA: Diagnosis not present

## 2020-04-25 DIAGNOSIS — O99213 Obesity complicating pregnancy, third trimester: Secondary | ICD-10-CM | POA: Diagnosis not present

## 2020-04-25 DIAGNOSIS — O99513 Diseases of the respiratory system complicating pregnancy, third trimester: Secondary | ICD-10-CM

## 2020-04-25 DIAGNOSIS — O360130 Maternal care for anti-D [Rh] antibodies, third trimester, not applicable or unspecified: Secondary | ICD-10-CM

## 2020-05-03 ENCOUNTER — Telehealth (INDEPENDENT_AMBULATORY_CARE_PROVIDER_SITE_OTHER): Payer: Medicaid Other | Admitting: Obstetrics

## 2020-05-03 ENCOUNTER — Telehealth: Payer: Self-pay

## 2020-05-03 ENCOUNTER — Encounter: Payer: Self-pay | Admitting: Obstetrics

## 2020-05-03 DIAGNOSIS — O99333 Smoking (tobacco) complicating pregnancy, third trimester: Secondary | ICD-10-CM

## 2020-05-03 DIAGNOSIS — F17200 Nicotine dependence, unspecified, uncomplicated: Secondary | ICD-10-CM

## 2020-05-03 DIAGNOSIS — O99213 Obesity complicating pregnancy, third trimester: Secondary | ICD-10-CM

## 2020-05-03 DIAGNOSIS — E669 Obesity, unspecified: Secondary | ICD-10-CM

## 2020-05-03 DIAGNOSIS — O99210 Obesity complicating pregnancy, unspecified trimester: Secondary | ICD-10-CM

## 2020-05-03 DIAGNOSIS — O360130 Maternal care for anti-D [Rh] antibodies, third trimester, not applicable or unspecified: Secondary | ICD-10-CM

## 2020-05-03 DIAGNOSIS — Z3A33 33 weeks gestation of pregnancy: Secondary | ICD-10-CM

## 2020-05-03 DIAGNOSIS — Z6791 Unspecified blood type, Rh negative: Secondary | ICD-10-CM

## 2020-05-03 DIAGNOSIS — O26899 Other specified pregnancy related conditions, unspecified trimester: Secondary | ICD-10-CM

## 2020-05-03 NOTE — Progress Notes (Signed)
Pt will take blood pressure later and let us know what it is

## 2020-05-03 NOTE — Telephone Encounter (Signed)
Attempted to call pt to begin virtual visit. Pt did not answer. Voicemail left asking pt to return call to office.  

## 2020-05-03 NOTE — Progress Notes (Signed)
OBSTETRICS PRENATAL VIRTUAL VISIT ENCOUNTER NOTE  [facility]   I connected with Cassandra MealyKristyle J Harrison on 05/03/20 at  9:45 AM EST by MyChart Video Encounter at home and verified that I am speaking with the correct person using two identifiers.   I discussed the limitations, risks, security and privacy concerns of performing an evaluation and management service virtually and the availability of in person appointments. I also discussed with the patient that there may be a patient responsible charge related to this service. The patient expressed understanding and agreed to proceed. Subjective:  Cassandra Harrison is a 29 y.o. G2P1001 at 7572w3d being seen today for ongoing prenatal care.  She is currently monitored for the following issues for this high-risk pregnancy and has Supervision of other normal pregnancy, antepartum; Rh negative state in antepartum period; Obesity in pregnancy; and High risk pregnancy due to smoking in third trimester on their problem list.  Patient reports uppper respiratory tract infection.  Contractions: Not present. Vag. Bleeding: None.  Movement: Present. Denies any leaking of fluid.   The following portions of the patient's history were reviewed and updated as appropriate: allergies, current medications, past family history, past medical history, past social history, past surgical history and problem list.   Objective:  There were no vitals filed for this visit.  Fetal Status:     Movement: Present     General:  Alert, oriented and cooperative. Patient is in no acute distress.  Respiratory: Normal respiratory effort, no problems with respiration noted  Mental Status: Normal mood and affect. Normal behavior. Normal judgment and thought content.  Rest of physical exam deferred due to type of encounter  Imaging: DG Chest 1 View  Result Date: 04/22/2020 CLINICAL DATA:  Cough and chest tightness EXAM: CHEST  1 VIEW  COMPARISON:  Oct 21, 2017 FINDINGS: The lungs are clear. Heart size and pulmonary vascularity are normal. No adenopathy. No pneumothorax. No bone lesions. IMPRESSION: The lungs are clear. The cardiac silhouette is within normal limits. Electronically Signed   By: Bretta BangWilliam  Woodruff III M.D.   On: 04/22/2020 14:53   US MFM OB FOLLOW UP  Result Date: 04/25/2020 ----------------------------------------------------------------------  OBSTETRICS REPORT                       (Signed Final 04/25/2020 11:21 am) ---------------------------------------------------------------------- Patient Info  ID #:       [id number]                          D.O.B.:  08-31-1990 (29 yrs)  Name:       Cassandra Harrison               Visit Date: 04/25/2020 10:17 am ---------------------------------------------------------------------- Performed By  Attending:        Ma RingsVictor Fang MD         Ref. Address:     [address]                                                             [address]  [address]                                                             [place]                                                             [address]  Performed By:     Reinaldo Raddle            [facility]  Referred By:      [facility] ---------------------------------------------------------------------- Orders  #  Description                           Code        Ordered By  1  [place] MFM OB FOLLOW UP                   60454.09    Noralee Space ----------------------------------------------------------------------  #  Order #                     Accession #                Episode #  1  [id number]                   [id number]                 [id number]  ---------------------------------------------------------------------- Indications  Obesity complicating pregnancy, third          M84.132  trimester  Encounter for other antenatal screening        Z36.2  follow-up  (Low Risk NIPS)  [redacted] weeks gestation of pregnancy                Z3A.32  Smoking complicating pregnancy, third          O99.333  trimester  Asthma  O99.89 j45.909  Rh negative state in antepartum                O36.0190 ---------------------------------------------------------------------- Fetal Evaluation  Num Of Fetuses:         1  Fetal Heart Rate(bpm):  129  Cardiac Activity:       Observed  Presentation:           Cephalic  Placenta:               Anterior Fundal  P. Cord Insertion:      Visualized, central  Amniotic Fluid  AFI FV:      Within normal limits  AFI Sum(cm)     %Tile       Largest Pocket(cm)  13.99           47          8.16  RUQ(cm)       RLQ(cm)       LUQ(cm)        LLQ(cm)  2.87          8.16          0              2.96 ---------------------------------------------------------------------- Biometry  BPD:      82.1  mm     G. Age:  33w 0d         64  %    CI:        78.02   %    70 - 86                                                          FL/HC:      18.8   %    19.1 - 21.3  HC:      294.1  mm     G. Age:  32w 3d         18  %    HC/AC:      1.03        0.96 - 1.17  AC:      284.2  mm     G. Age:  32w 3d         54  %    FL/BPD:     67.5   %    71 - 87  FL:       55.4  mm     G. Age:  29w 1d        < 1  %    FL/AC:      19.5   %    20 - 24  LV:          4  mm  Est. FW:    1784  gm    3 lb 15 oz      19  % ---------------------------------------------------------------------- OB History  Gravidity:    2         Term:   1  Living:       1 ---------------------------------------------------------------------- Gestational Age  LMP:           31w 0d        Date:  09/21/19                 EDD:   06/27/20  U/S Today:     31w 5d                                         EDD:   06/22/20  Best:          32w 2d     Det. By:  U/S  (02/01/20)          EDD:   06/18/20 ---------------------------------------------------------------------- Anatomy  Cranium:               Previously seen        LVOT:                   Previously seen  Cavum:                 Previously seen        Aortic Arch:            Previously seen  Ventricles:            Appears normal         Ductal Arch:            Not well visualized  Choroid Plexus:        Previously seen        Diaphragm:              Appears normal  Cerebellum:            Previously seen        Stomach:                Appears normal, left                                                                        sided  Posterior Fossa:       Previously seen        Abdomen:                Appears normal  Nuchal Fold:           Not applicable (>20    Abdominal Wall:         Previously seen                         wks GA)  Face:                  Orbits and profile     Cord Vessels:           Previously seen                         previously seen  Lips:                  Previously seen        Kidneys:                Appear normal  Palate:                Previously seen        Bladder:  Appears normal  Thoracic:              Previously seen        Spine:                  Previously seen  Heart:                 Previously seen        Upper Extremities:      Previously seen  RVOT:                  Previously seen        Lower Extremities:      Previously seen  Other:  Other anatomy previously imaged and appeared normal. Fetus          appears to be female. Nasal bone prev visualized. 3VV and 3VTV          prev visualized. IVC/SVC prev seen. ---------------------------------------------------------------------- Cervix Uterus Adnexa  Cervix  Not visualized (advanced GA >24wks)  Uterus  No abnormality visualized.  Right Ovary  Not visualized.  Left Ovary  Not visualized.  Cul De Sac  No free fluid seen.  Adnexa  No adnexal mass  visualized. ---------------------------------------------------------------------- Comments  This patient was seen for a follow up growth scan due to  maternal obesity.  She denies any problems since her last  exam.  She was informed that the fetal growth and amniotic fluid  level appears appropriate for her gestational age.  A follow up exam was scheduled in 4 weeks. ----------------------------------------------------------------------                   Ma Rings, MD Electronically Signed Final Report   04/25/2020 11:21 am ----------------------------------------------------------------------   Assessment and Plan:  Pregnancy: G2P1001 at [redacted]w[redacted]d  1. High risk pregnancy due to smoking in third trimester  2. Rh negative state in antepartum period  3. Obesity in pregnancy   There are no diagnoses linked to this encounter. Preterm labor symptoms and general obstetric precautions including but not limited to vaginal bleeding, contractions, leaking of fluid and fetal movement were reviewed in detail with the patient. I discussed the assessment and treatment plan with the patient. The patient was provided an opportunity to ask questions and all were answered. The patient agreed with the plan and demonstrated an understanding of the instructions. The patient was advised to call back or seek an in-person office evaluation/go to MAU at [facility] for any urgent or concerning symptoms. Please refer to After Visit Summary for other counseling recommendations.   I provided 15 minutes of face-to-face time during this encounter.  Return in about 3 weeks (around 05/24/2020) for [facility].  Future Appointments  Date Time Provider Department Center  05/23/2020  9:30 AM [facility] NURSE [facility]  05/23/2020  9:45 AM [facility] US5 [facility]    Coral Ceo, MD Center for [facility], [facility] 05/03/20

## 2020-05-11 DIAGNOSIS — U071 COVID-19: Secondary | ICD-10-CM

## 2020-05-11 HISTORY — DX: COVID-19: U07.1

## 2020-05-23 ENCOUNTER — Ambulatory Visit: Payer: Medicaid Other | Admitting: *Deleted

## 2020-05-23 ENCOUNTER — Other Ambulatory Visit: Payer: Self-pay

## 2020-05-23 ENCOUNTER — Ambulatory Visit: Payer: Medicaid Other | Attending: Obstetrics

## 2020-05-23 DIAGNOSIS — O99213 Obesity complicating pregnancy, third trimester: Secondary | ICD-10-CM | POA: Diagnosis not present

## 2020-05-23 DIAGNOSIS — O99333 Smoking (tobacco) complicating pregnancy, third trimester: Secondary | ICD-10-CM | POA: Diagnosis present

## 2020-05-23 DIAGNOSIS — O26899 Other specified pregnancy related conditions, unspecified trimester: Secondary | ICD-10-CM | POA: Insufficient documentation

## 2020-05-23 DIAGNOSIS — O99210 Obesity complicating pregnancy, unspecified trimester: Secondary | ICD-10-CM | POA: Insufficient documentation

## 2020-05-23 DIAGNOSIS — F17200 Nicotine dependence, unspecified, uncomplicated: Secondary | ICD-10-CM

## 2020-05-23 DIAGNOSIS — Z6791 Unspecified blood type, Rh negative: Secondary | ICD-10-CM | POA: Diagnosis present

## 2020-05-23 DIAGNOSIS — J45909 Unspecified asthma, uncomplicated: Secondary | ICD-10-CM

## 2020-05-23 DIAGNOSIS — E669 Obesity, unspecified: Secondary | ICD-10-CM

## 2020-05-23 DIAGNOSIS — Z3A36 36 weeks gestation of pregnancy: Secondary | ICD-10-CM

## 2020-05-23 DIAGNOSIS — R638 Other symptoms and signs concerning food and fluid intake: Secondary | ICD-10-CM | POA: Diagnosis not present

## 2020-05-23 DIAGNOSIS — O360130 Maternal care for anti-D [Rh] antibodies, third trimester, not applicable or unspecified: Secondary | ICD-10-CM

## 2020-05-23 DIAGNOSIS — O99513 Diseases of the respiratory system complicating pregnancy, third trimester: Secondary | ICD-10-CM

## 2020-05-23 DIAGNOSIS — Z362 Encounter for other antenatal screening follow-up: Secondary | ICD-10-CM

## 2020-05-24 ENCOUNTER — Encounter: Payer: Self-pay | Admitting: Obstetrics and Gynecology

## 2020-05-24 ENCOUNTER — Ambulatory Visit (INDEPENDENT_AMBULATORY_CARE_PROVIDER_SITE_OTHER): Payer: Medicaid Other | Admitting: Obstetrics and Gynecology

## 2020-05-24 ENCOUNTER — Other Ambulatory Visit (HOSPITAL_COMMUNITY)
Admission: RE | Admit: 2020-05-24 | Discharge: 2020-05-24 | Disposition: A | Discharge status: Home / Self Care | Payer: Medicaid Other | Source: Ambulatory Visit | Attending: Obstetrics and Gynecology | Admitting: Obstetrics and Gynecology

## 2020-05-24 VITALS — BP 100/68 | HR 86 | Wt 242.0 lb

## 2020-05-24 DIAGNOSIS — Z3480 Encounter for supervision of other normal pregnancy, unspecified trimester: Secondary | ICD-10-CM | POA: Diagnosis present

## 2020-05-24 DIAGNOSIS — O99330 Smoking (tobacco) complicating pregnancy, unspecified trimester: Secondary | ICD-10-CM

## 2020-05-24 NOTE — Progress Notes (Signed)
   PRENATAL VISIT NOTE  Subjective:  Cassandra Harrison is a 29 y.o. G2P1001 at [redacted]w[redacted]d being seen today for ongoing prenatal care.  She is currently monitored for the following issues for this low-risk pregnancy and has Supervision of other normal pregnancy, antepartum; Rh negative state in antepartum period; Obesity in pregnancy; and High risk pregnancy due to smoking in third trimester on their problem list.  Patient reports no complaints.  Contractions: Not present. Vag. Bleeding: None.  Movement: Present. Denies leaking of fluid.   The following portions of the patient's history were reviewed and updated as appropriate: allergies, current medications, past family history, past medical history, past social history, past surgical history and problem list.   Objective:   Vitals:   05/24/20 0948  BP: 100/68  Pulse: 86  Weight: 242 lb (109.8 kg)    Fetal Status: Fetal Heart Rate (bpm): 143 Fundal Height: 36 cm Movement: Present  Presentation: Vertex  General:  Alert, oriented and cooperative. Patient is in no acute distress.  Skin: Skin is warm and dry. No rash noted.   Cardiovascular: Normal heart rate noted  Respiratory: Normal respiratory effort, no problems with respiration noted  Abdomen: Soft, gravid, appropriate for gestational age.  Pain/Pressure: Present     Pelvic: Cervical exam deferred        Extremities: Normal range of motion.  Edema: Trace  Mental Status: Normal mood and affect. Normal behavior. Normal judgment and thought content.   Assessment and Plan:  Pregnancy: G2P1001 at [redacted]w[redacted]d 1. Supervision of other normal pregnancy, antepartum - GBS and genital cultures collected.   2. Tobacco smoking affecting pregnancy, antepartum - Risk of tobacco use in pregnancy and to baby once delivered was discussed. - Smoking cessation recommended.  Preterm labor symptoms and general obstetric precautions including but not limited to vaginal bleeding, contractions, leaking of fluid  and fetal movement were reviewed in detail with the patient. Please refer to After Visit Summary for other counseling recommendations.   Return in about 1 week (around 05/31/2020) for ROB.  No future appointments.  Johnny Bridge, MD

## 2020-05-24 NOTE — Progress Notes (Signed)
ROB/GBS. Reports no problems today. 

## 2020-05-25 LAB — CERVICOVAGINAL ANCILLARY ONLY
Chlamydia: NEGATIVE
Comment: NEGATIVE
Comment: NORMAL
Neisseria Gonorrhea: NEGATIVE

## 2020-05-26 LAB — STREP GP B NAA: Strep Gp B NAA: NEGATIVE

## 2020-06-01 ENCOUNTER — Encounter (HOSPITAL_COMMUNITY): Payer: Self-pay | Admitting: Family Medicine

## 2020-06-01 ENCOUNTER — Inpatient Hospital Stay (HOSPITAL_COMMUNITY)
Admission: AD | Admit: 2020-06-01 | Discharge: 2020-06-01 | Disposition: A | Discharge status: Home / Self Care | Payer: Medicaid Other | Attending: Family Medicine | Admitting: Family Medicine

## 2020-06-01 ENCOUNTER — Inpatient Hospital Stay (HOSPITAL_COMMUNITY): Payer: Medicaid Other

## 2020-06-01 ENCOUNTER — Encounter: Payer: Medicaid Other | Admitting: Obstetrics and Gynecology

## 2020-06-01 ENCOUNTER — Other Ambulatory Visit: Payer: Self-pay

## 2020-06-01 DIAGNOSIS — J45909 Unspecified asthma, uncomplicated: Secondary | ICD-10-CM | POA: Insufficient documentation

## 2020-06-01 DIAGNOSIS — O99213 Obesity complicating pregnancy, third trimester: Secondary | ICD-10-CM | POA: Insufficient documentation

## 2020-06-01 DIAGNOSIS — F17210 Nicotine dependence, cigarettes, uncomplicated: Secondary | ICD-10-CM | POA: Insufficient documentation

## 2020-06-01 DIAGNOSIS — R0602 Shortness of breath: Secondary | ICD-10-CM

## 2020-06-01 DIAGNOSIS — O99333 Smoking (tobacco) complicating pregnancy, third trimester: Secondary | ICD-10-CM | POA: Diagnosis not present

## 2020-06-01 DIAGNOSIS — O99210 Obesity complicating pregnancy, unspecified trimester: Secondary | ICD-10-CM

## 2020-06-01 DIAGNOSIS — O98513 Other viral diseases complicating pregnancy, third trimester: Secondary | ICD-10-CM | POA: Insufficient documentation

## 2020-06-01 DIAGNOSIS — O368130 Decreased fetal movements, third trimester, not applicable or unspecified: Secondary | ICD-10-CM

## 2020-06-01 DIAGNOSIS — O360130 Maternal care for anti-D [Rh] antibodies, third trimester, not applicable or unspecified: Secondary | ICD-10-CM

## 2020-06-01 DIAGNOSIS — Z79899 Other long term (current) drug therapy: Secondary | ICD-10-CM | POA: Insufficient documentation

## 2020-06-01 DIAGNOSIS — O99513 Diseases of the respiratory system complicating pregnancy, third trimester: Secondary | ICD-10-CM | POA: Insufficient documentation

## 2020-06-01 DIAGNOSIS — Z3689 Encounter for other specified antenatal screening: Secondary | ICD-10-CM

## 2020-06-01 DIAGNOSIS — U071 COVID-19: Secondary | ICD-10-CM | POA: Insufficient documentation

## 2020-06-01 DIAGNOSIS — Z3A37 37 weeks gestation of pregnancy: Secondary | ICD-10-CM | POA: Insufficient documentation

## 2020-06-01 DIAGNOSIS — E669 Obesity, unspecified: Secondary | ICD-10-CM

## 2020-06-01 DIAGNOSIS — O26899 Other specified pregnancy related conditions, unspecified trimester: Secondary | ICD-10-CM

## 2020-06-01 LAB — COMPREHENSIVE METABOLIC PANEL
ALT: 15 U/L (ref 0–44)
AST: 17 U/L (ref 15–41)
Albumin: 2.7 g/dL — ABNORMAL LOW (ref 3.5–5.0)
Alkaline Phosphatase: 98 U/L (ref 38–126)
Anion gap: 8 (ref 5–15)
BUN: <5 mg/dL — ABNORMAL LOW (ref 6–20)
CO2: 22 mmol/L (ref 22–32)
Calcium: 9.6 mg/dL (ref 8.9–10.3)
Chloride: 107 mmol/L (ref 98–111)
Creatinine, Ser: 0.5 mg/dL (ref 0.44–1.00)
GFR, Estimated: >60 mL/min (ref >60)
Glucose, Bld: 104 mg/dL — ABNORMAL HIGH (ref 70–99)
Potassium: 3.7 mmol/L (ref 3.5–5.1)
Sodium: 137 mmol/L (ref 135–145)
Total Bilirubin: 0.3 mg/dL (ref 0.3–1.2)
Total Protein: 5.8 g/dL — ABNORMAL LOW (ref 6.5–8.1)

## 2020-06-01 LAB — CBC WITH DIFFERENTIAL/PLATELET
Abs Immature Granulocytes: 0.02 10*3/uL (ref 0.00–0.07)
Basophils Absolute: 0 10*3/uL (ref 0.0–0.1)
Basophils Relative: 0 %
Eosinophils Absolute: 0.1 10*3/uL (ref 0.0–0.5)
Eosinophils Relative: 1 %
HCT: 37.8 % (ref 36.0–46.0)
Hemoglobin: 12.4 g/dL (ref 12.0–15.0)
Immature Granulocytes: 0 %
Lymphocytes Relative: 20 %
Lymphs Abs: 1.1 10*3/uL (ref 0.7–4.0)
MCH: 27.6 pg (ref 26.0–34.0)
MCHC: 32.8 g/dL (ref 30.0–36.0)
MCV: 84.2 fL (ref 80.0–100.0)
Monocytes Absolute: 0.4 10*3/uL (ref 0.1–1.0)
Monocytes Relative: 8 %
Neutro Abs: 3.7 10*3/uL (ref 1.7–7.7)
Neutrophils Relative %: 71 %
Platelets: 184 10*3/uL (ref 150–400)
RBC: 4.49 MIL/uL (ref 3.87–5.11)
RDW: 13.5 % (ref 11.5–15.5)
WBC: 5.3 10*3/uL (ref 4.0–10.5)
nRBC: 0 % (ref 0.0–0.2)

## 2020-06-01 LAB — RESP PANEL BY RT-PCR (FLU A&B, COVID) ARPGX2
Influenza A by PCR: NEGATIVE
Influenza B by PCR: NEGATIVE
SARS Coronavirus 2 by RT PCR: POSITIVE — AB

## 2020-06-01 LAB — D-DIMER, QUANTITATIVE: D-Dimer, Quant: 0.81 ug/mL-FEU — ABNORMAL HIGH (ref 0.00–0.50)

## 2020-06-01 LAB — C-REACTIVE PROTEIN: CRP: 0.7 mg/dL (ref <1.0)

## 2020-06-01 LAB — BRAIN NATRIURETIC PEPTIDE: B Natriuretic Peptide: 26.7 pg/mL (ref 0.0–100.0)

## 2020-06-01 LAB — FERRITIN: Ferritin: 23 ng/mL (ref 11–307)

## 2020-06-01 LAB — TROPONIN I (HIGH SENSITIVITY): Troponin I (High Sensitivity): 3 ng/L (ref <18)

## 2020-06-01 LAB — FIBRINOGEN: Fibrinogen: 455 mg/dL (ref 210–475)

## 2020-06-01 NOTE — MAU Provider Note (Signed)
History     CSN: [id number]  Arrival date and time: 06/01/20 1748   Event Date/Time   First Provider Initiated Contact with Patient 06/01/20 1842      Chief Complaint  Patient presents with  . Decreased Fetal Movement   Cassandra Harrison is a 29 y.o. G2P1001 at [redacted]w[redacted]d who presents today with decreased fetal movement. She states that she has had covid symptoms since Saturday 05/28/2020. She reports that last night she had to use her inhaler due to shortness of breath. She rpeorts that is a common thing that happens when she gets a cold. She denies any fever, but has had congestion, cough and loss of taste and smell. Patient has not had any covid-19 vaccine. She reports that she read that covid-19 infection in pregnancy could hurt her baby,and she wants [place] to be able to tell her that her baby will be fine. She has not felt the baby move like normal today. She denies any contractions, VB or  LOF.   URI  This is a new problem. The current episode started in the past 7 days. The problem has been gradually worsening. There has been no fever. Associated symptoms include congestion, coughing and wheezing. She has tried inhaler use for the symptoms. The treatment provided mild relief.    OB History    Gravida  2   Para  1   Term  1   Preterm  0   AB  0   Living  1     SAB  0   IAB  0   Ectopic  0   Multiple  0   Live Births  1           Past Medical History:  Diagnosis Date  . Asthma    last used 11/29/11  . Bulging disc   . Chronic back pain   . Constipation   . Migraine     Past Surgical History:  Procedure Laterality Date  . WISDOM TOOTH EXTRACTION      Family History  Problem Relation Age of Onset  . Diabetes Mother   . COPD Mother   . Heart disease Mother   . Cancer Mother        throat cancer  . COPD Father   . Heart disease Father   . Cancer Sister   . Cancer Paternal Aunt   . Diabetes Maternal Grandmother   . Cancer Maternal Grandmother   .  COPD Paternal Grandmother   . Heart disease Paternal Grandfather   . Other Neg Hx     Social History   Tobacco Use  . Smoking status: Current Every Day Smoker    Packs/day: 0.25  . Smokeless tobacco: Never Used  Vaping Use  . Vaping Use: Never used  Substance Use Topics  . Alcohol use: Not Currently    Comment: occ  . Drug use: No    Allergies:  Allergies  Allergen Reactions  . Olive Oil Nausea And Vomiting    She can not have anything with olives in it  . Latex Rash    Patient states that she is allergic to latex condoms.  They give her a rash and a yeast infection.    Medications Prior to Admission  Medication Sig Dispense Refill Last Dose  . albuterol (VENTOLIN HFA) 108 (90 Base) MCG/ACT inhaler Inhale 1-2 puffs into the lungs every 6 (six) hours as needed for wheezing or shortness of breath. 1 each 0 05/31/2020 at Unknown  time  . aspirin 81 MG chewable tablet Chew 1 tablet (81 mg total) by mouth daily. 30 tablet 5 Past Week at Unknown time  . docusate sodium (COLACE) 100 MG capsule Take 1 capsule (100 mg total) by mouth 2 (two) times daily. 60 capsule 5 Past Week at Unknown time  . ferrous sulfate 325 (65 FE) MG tablet Take 1 tablet (325 mg total) by mouth 2 (two) times daily with a meal. 60 tablet 5 Past Week at Unknown time  . guaiFENesin-dextromethorphan (ROBITUSSIN DM) 100-10 MG/5ML syrup Take 5 mLs by mouth every 4 (four) hours as needed for cough.   05/31/2020 at Unknown time  . Prenatal MV-Min-FA-Omega-3 (PRENATAL GUMMIES/DHA & FA) 0.4-32.5 MG CHEW Chew 3 tablets by mouth daily. 90 tablet 12 Past Week at Unknown time  . Blood Pressure Monitoring (BLOOD PRESSURE KIT) DEVI 1 kit by Does not apply route as needed. (Patient not taking: Reported on 05/03/2020) 1 each 0   . calcium carbonate (TUMS EX) 750 MG chewable tablet Chew 1 tablet by mouth daily. (Patient not taking: Reported on 05/03/2020)     . Elastic Bandages & Supports (COMFORT FIT MATERNITY SUPP SM) MISC Wear  as directed. (Patient not taking: Reported on 05/03/2020) 1 each 0   . guaiFENesin (MUCINEX) 600 MG 12 hr tablet Take 1 tablet (600 mg total) by mouth 2 (two) times daily. (Patient not taking: Reported on 05/03/2020) 14 tablet 0     Review of Systems  HENT: Positive for congestion.   Respiratory: Positive for cough, shortness of breath and wheezing.   Genitourinary: Negative for frequency, pelvic pain, vaginal bleeding and vaginal discharge.   Physical Exam   Blood pressure 106/68, pulse 83, temperature 98.5 F (36.9 C), temperature source Oral, resp. rate 18, last menstrual period 09/21/2019, SpO2 97 %.  Physical Exam Vitals and nursing note reviewed.  Constitutional:      Appearance: She is ill-appearing.  HENT:     Head: Normocephalic.  Cardiovascular:     Rate and Rhythm: Normal rate.  Pulmonary:     Comments: Increased work of breathing  Skin:    General: Skin is warm and dry.  Neurological:     Mental Status: She is alert and oriented to person, place, and time.  Psychiatric:        Mood and Affect: Mood normal.        Behavior: Behavior normal.    NST:  Baseline: 135 Variability: moderate Accels: 15x15 Decels: none Toco: none Reactive/Appropriate for GA  Results for orders placed or performed during the hospital encounter of 06/01/20 (from the past 24 hour(s))  Resp Panel by RT-PCR (Flu A&B, Covid) Nasopharyngeal Swab     Status: Abnormal   Collection Time: 06/01/20  6:15 PM   Specimen: Nasopharyngeal Swab; Nasopharyngeal(NP) swabs in vial transport medium  Result Value Ref Range   SARS Coronavirus 2 by RT PCR POSITIVE (A) NEGATIVE   Influenza A by PCR NEGATIVE NEGATIVE   Influenza B by PCR NEGATIVE NEGATIVE  CBC with Differential/Platelet     Status: None   Collection Time: 06/01/20  6:34 PM  Result Value Ref Range   WBC 5.3 4.0 - 10.5 K/uL   RBC 4.49 3.87 - 5.11 MIL/uL   Hemoglobin 12.4 12.0 - 15.0 g/dL   HCT 37.8 36.0 - 46.0 %   MCV 84.2 80.0 - 100.0  fL   MCH 27.6 26.0 - 34.0 pg   MCHC 32.8 30.0 - 36.0 g/dL   RDW 13.5  11.5 - 15.5 %   Platelets 184 150 - 400 K/uL   nRBC 0.0 0.0 - 0.2 %   Neutrophils Relative % 71 %   Neutro Abs 3.7 1.7 - 7.7 K/uL   Lymphocytes Relative 20 %   Lymphs Abs 1.1 0.7 - 4.0 K/uL   Monocytes Relative 8 %   Monocytes Absolute 0.4 0.1 - 1.0 K/uL   Eosinophils Relative 1 %   Eosinophils Absolute 0.1 0.0 - 0.5 K/uL   Basophils Relative 0 %   Basophils Absolute 0.0 0.0 - 0.1 K/uL   Immature Granulocytes 0 %   Abs Immature Granulocytes 0.02 0.00 - 0.07 K/uL  Brain natriuretic peptide     Status: None   Collection Time: 06/01/20  6:34 PM  Result Value Ref Range   B Natriuretic Peptide 26.7 0.0 - 100.0 pg/mL  Comprehensive metabolic panel     Status: Abnormal   Collection Time: 06/01/20  6:34 PM  Result Value Ref Range   Sodium 137 135 - 145 mmol/L   Potassium 3.7 3.5 - 5.1 mmol/L   Chloride 107 98 - 111 mmol/L   CO2 22 22 - 32 mmol/L   Glucose, Bld 104 (H) 70 - 99 mg/dL   BUN <5 (L) 6 - 20 mg/dL   Creatinine, Ser 0.50 0.44 - 1.00 mg/dL   Calcium 9.6 8.9 - 10.3 mg/dL   Total Protein 5.8 (L) 6.5 - 8.1 g/dL   Albumin 2.7 (L) 3.5 - 5.0 g/dL   AST 17 15 - 41 U/L   ALT 15 0 - 44 U/L   Alkaline Phosphatase 98 38 - 126 U/L   Total Bilirubin 0.3 0.3 - 1.2 mg/dL   GFR, Estimated >60 >60 mL/min   Anion gap 8 5 - 15  Troponin I (High Sensitivity)     Status: None   Collection Time: 06/01/20  6:34 PM  Result Value Ref Range   Troponin I (High Sensitivity) 3 <18 ng/L  C-reactive protein     Status: None   Collection Time: 06/01/20  6:34 PM  Result Value Ref Range   CRP 0.7 <1.0 mg/dL  D-dimer, quantitative (not at [facility])     Status: Abnormal   Collection Time: 06/01/20  6:34 PM  Result Value Ref Range   D-Dimer, Quant 0.81 (H) 0.00 - 0.50 ug/mL-FEU  Fibrinogen     Status: None   Collection Time: 06/01/20  6:34 PM  Result Value Ref Range   Fibrinogen 455 210 - 475 mg/dL  Ferritin     Status: None    Collection Time: 06/01/20  6:34 PM  Result Value Ref Range   Ferritin 23 11 - 307 ng/mL     MAU Course  Procedures   Patient has had normal O2 sat the entire time here.  FHR has been reactive Labs are normal  Discussed M[facility] infusion. Patient would like to have this done. Message sent to the [facility] infusion staff for them to call with an appt for infusion.   Assessment and Plan   1. COVID-19 affecting pregnancy, antepartum   2. Obesity in pregnancy   3. High risk pregnancy due to smoking in third trimester   4. Rh negative state in antepartum period   5. Shortness of breath   6. [redacted] weeks gestation of pregnancy   7. NST (non-stress test) reactive    DC home Comfort measures reviewed  3rd Trimester precautions  labor precautions  Fetal kick counts RX: none Return to MAU as needed  [facility] infusion clinic will call with an appt    [place] [facility] Follow up.   Specialty: Internal Medicine Why: they will call you with an appt  Contact information: [place] DNP, CNM  06/01/20  8:21 PM

## 2020-06-01 NOTE — MAU Note (Addendum)
Pt state she began feeling decreased today. On Saturday, she began feeling cold symptoms- runny nose, headache and dry eyes. She started having loss of taste and smell yesterday. Is feeling wheezing in her chest and has trouble taking deep breaths.   Denies bleeding. Has mucous discharge. Took home Covid test which was positive today.

## 2020-06-01 NOTE — Progress Notes (Signed)
Pt given fetal movement clicker 

## 2020-06-01 NOTE — Discharge Instructions (Signed)
10 Things You Can Do to Manage Your COVID-19 Symptoms at Home If you have possible or confirmed COVID-19: 1. Stay home from work and school. And stay away from other public places. If you must go out, avoid using any kind of public transportation, ridesharing, or taxis. 2. Monitor your symptoms carefully. If your symptoms get worse, call your healthcare provider immediately. 3. Get rest and stay hydrated. 4. If you have a medical appointment, call the healthcare provider ahead of time and tell them that you have or may have COVID-19. 5. For medical emergencies, call 911 and notify the dispatch personnel that you have or may have COVID-19. 6. Cover your cough and sneezes with a tissue or use the inside of your elbow. 7. Wash your hands often with soap and water for at least 20 seconds or clean your hands with an alcohol-based hand sanitizer that contains at least 60% alcohol. 8. As much as possible, stay in a specific room and away from other people in your home. Also, you should use a separate bathroom, if available. If you need to be around other people in or outside of the home, wear a mask. 9. Avoid sharing personal items with other people in your household, like dishes, towels, and bedding. 10. Clean all surfaces that are touched often, like counters, tabletops, and doorknobs. Use household cleaning sprays or wipes according to the label instructions. [employer] 12/10/2018 This information is not intended to replace advice given to you by your health care provider. Make sure you discuss any questions you have with your health care provider. Document Revised: 05/14/2019 Document Reviewed: 05/14/2019 Elsevier Patient Education  2020 Elsevier Inc.   Safe Medications in Pregnancy   Acne: Benzoyl Peroxide Salicylic Acid  Backache/Headache: Tylenol: 2 regular strength every 4 hours OR              2 Extra strength every 6 hours  Colds/Coughs/Allergies: Benadryl (alcohol free) 25  mg every 6 hours as needed Breath right strips Claritin Cepacol throat lozenges Chloraseptic throat spray Cold-Eeze- up to three times per day Cough drops, alcohol free Flonase (by prescription only) Guaifenesin Mucinex Robitussin DM (plain only, alcohol free) Saline nasal spray/drops Sudafed (pseudoephedrine) & Actifed ** use only after [redacted] weeks gestation and if you do not have high blood pressure Tylenol Vicks Vaporub Zinc lozenges Zyrtec   Constipation: Colace Ducolax suppositories Fleet enema Glycerin suppositories Metamucil Milk of magnesia Miralax Senokot Smooth move tea  Diarrhea: Kaopectate Imodium A-D  *NO pepto Bismol  Hemorrhoids: Anusol Anusol HC Preparation H Tucks  Indigestion: Tums Maalox Mylanta Zantac  Pepcid  Insomnia: Benadryl (alcohol free) 25mg  every 6 hours as needed Tylenol PM Unisom, no Gelcaps  Leg Cramps: Tums MagGel  Nausea/Vomiting:  Bonine Dramamine Emetrol Ginger extract Sea bands Meclizine  Nausea medication to take during pregnancy:  Unisom (doxylamine succinate 25 mg tablets) Take one tablet daily at bedtime. If symptoms are not adequately controlled, the dose can be increased to a maximum recommended dose of two tablets daily (1/2 tablet in the morning, 1/2 tablet mid-afternoon and one at bedtime). Vitamin B6 100mg  tablets. Take one tablet twice a day (up to 200 mg per day).  Skin Rashes: Aveeno products Benadryl cream or 25mg  every 6 hours as needed Calamine Lotion 1% cortisone cream  Yeast infection: Gyne-lotrimin 7 Monistat 7   **If taking multiple medications, please check labels to avoid duplicating the same active ingredients **take medication as directed on the label ** Do not exceed 4000 mg  tylenol in 24 hours **Do not take medications that contain aspirin or ibuprofen    

## 2020-06-02 ENCOUNTER — Encounter: Payer: Self-pay | Admitting: Nurse Practitioner

## 2020-06-02 ENCOUNTER — Other Ambulatory Visit: Payer: Self-pay | Admitting: Nurse Practitioner

## 2020-06-02 ENCOUNTER — Ambulatory Visit (HOSPITAL_COMMUNITY)
Admission: RE | Admit: 2020-06-02 | Discharge: 2020-06-02 | Disposition: A | Discharge status: Home / Self Care | Payer: Medicaid Other | Source: Ambulatory Visit | Attending: Pulmonary Disease | Admitting: Pulmonary Disease

## 2020-06-02 DIAGNOSIS — O99333 Smoking (tobacco) complicating pregnancy, third trimester: Secondary | ICD-10-CM | POA: Insufficient documentation

## 2020-06-02 DIAGNOSIS — J45909 Unspecified asthma, uncomplicated: Secondary | ICD-10-CM | POA: Insufficient documentation

## 2020-06-02 DIAGNOSIS — U071 COVID-19: Secondary | ICD-10-CM | POA: Insufficient documentation

## 2020-06-02 MED ORDER — FAMOTIDINE IN NACL 20-0.9 MG/50ML-% IV SOLN: 20.0000 mg | Freq: Once | INTRAVENOUS | Status: DC | PRN

## 2020-06-02 MED ORDER — SODIUM CHLORIDE 0.9 % IV SOLN: INTRAVENOUS | Status: DC | PRN

## 2020-06-02 MED ORDER — DIPHENHYDRAMINE HCL 50 MG/ML IJ SOLN: 50.0000 mg | Freq: Once | INTRAMUSCULAR | Status: DC | PRN

## 2020-06-02 MED ORDER — EPINEPHRINE 0.3 MG/0.3ML IJ SOAJ: 0.3000 mg | Freq: Once | INTRAMUSCULAR | Status: DC | PRN

## 2020-06-02 MED ORDER — METHYLPREDNISOLONE SODIUM SUCC 125 MG IJ SOLR: 125.0000 mg | Freq: Once | INTRAMUSCULAR | Status: DC | PRN

## 2020-06-02 MED ORDER — SODIUM CHLORIDE 0.9 % IV SOLN: Freq: Once | INTRAVENOUS | Status: AC

## 2020-06-02 MED ORDER — ALBUTEROL SULFATE HFA 108 (90 BASE) MCG/ACT IN AERS: 2.0000 | INHALATION_SPRAY | Freq: Once | RESPIRATORY_TRACT | Status: DC | PRN

## 2020-06-02 NOTE — Discharge Instructions (Signed)
10 Things You Can Do to Manage Your COVID-19 Symptoms at Home If you have possible or confirmed COVID-19: 1. Stay home from work and school. And stay away from other public places. If you must go out, avoid using any kind of public transportation, ridesharing, or taxis. 2. Monitor your symptoms carefully. If your symptoms get worse, call your healthcare provider immediately. 3. Get rest and stay hydrated. 4. If you have a medical appointment, call the healthcare provider ahead of time and tell them that you have or may have COVID-19. 5. For medical emergencies, call 911 and notify the dispatch personnel that you have or may have COVID-19. 6. Cover your cough and sneezes with a tissue or use the inside of your elbow. 7. Wash your hands often with soap and water for at least 20 seconds or clean your hands with an alcohol-based hand sanitizer that contains at least 60% alcohol. 8. As much as possible, stay in a specific room and away from other people in your home. Also, you should use a separate bathroom, if available. If you need to be around other people in or outside of the home, wear a mask. 9. Avoid sharing personal items with other people in your household, like dishes, towels, and bedding. 10. Clean all surfaces that are touched often, like counters, tabletops, and doorknobs. Use household cleaning sprays or wipes according to the label instructions. cdc.gov/coronavirus 12/10/2018 This information is not intended to replace advice given to you by your health care provider. Make sure you discuss any questions you have with your health care provider. Document Revised: 05/14/2019 Document Reviewed: 05/14/2019 Elsevier Patient Education  2020 Elsevier Inc. What types of side effects do monoclonal antibody drugs cause?  Common side effects  In general, the more common side effects caused by monoclonal antibody drugs include: . Allergic reactions, such as hives or itching . Flu-like signs and  symptoms, including chills, fatigue, fever, and muscle aches and pains . Nausea, vomiting . Diarrhea . Skin rashes . Low blood pressure   The CDC is recommending patients who receive monoclonal antibody treatments wait at least 90 days before being vaccinated.  Currently, there are no data on the safety and efficacy of mRNA COVID-19 vaccines in persons who received monoclonal antibodies or convalescent plasma as part of COVID-19 treatment. Based on the estimated half-life of such therapies as well as evidence suggesting that reinfection is uncommon in the 90 days after initial infection, vaccination should be deferred for at least 90 days, as a precautionary measure until additional information becomes available, to avoid interference of the antibody treatment with vaccine-induced immune responses. If you have any questions or concerns after the infusion please call the Advanced Practice Provider on call at [phone]. This number is ONLY intended for your use regarding questions or concerns about the infusion post-treatment side-effects.  Please do not provide this number to others for use. For return to work notes please contact your primary care provider.   If someone you know is interested in receiving treatment please have them call the COVID hotline at [phone].   

## 2020-06-02 NOTE — Progress Notes (Signed)
Patient reviewed Fact Sheet for Patients, Parents, and Caregivers for Emergency Use Authorization (EUA) of Casi/Regen for the Treatment of Coronavirus. Patient also reviewed and is agreeable to the estimated cost of treatment. Patient is agreeable to proceed.    

## 2020-06-02 NOTE — Progress Notes (Signed)
I connected by phone with Cassandra Harrison on 06/02/2020 at 9:27 AM to discuss the potential use of a new treatment for mild to moderate COVID-19 viral infection in non-hospitalized patients.  This patient is a 29 y.o. female that meets the FDA criteria for Emergency Use Authorization of COVID monoclonal antibody casirivimab/imdevimab, bamlanivimab/etesevimab, or sotrovimab.  Has a (+) direct SARS-CoV-2 viral test result  Has mild or moderate COVID-19   Is NOT hospitalized due to COVID-19  Is within 10 days of symptom onset  Has at least one of the high risk factor(s) for progression to severe COVID-19 and/or hospitalization as defined in EUA.  Specific high risk criteria : BMI > 25 and Pregnancy   I have spoken and communicated the following to the patient or parent/caregiver regarding COVID monoclonal antibody treatment:  1. FDA has authorized the emergency use for the treatment of mild to moderate COVID-19 in adults and pediatric patients with positive results of direct SARS-CoV-2 viral testing who are 63 years of age and older weighing at least 40 kg, and who are at high risk for progressing to severe COVID-19 and/or hospitalization.  2. The significant known and potential risks and benefits of COVID monoclonal antibody, and the extent to which such potential risks and benefits are unknown.  3. Information on available alternative treatments and the risks and benefits of those alternatives, including clinical trials.  4. Patients treated with COVID monoclonal antibody should continue to self-isolate and use infection control measures (e.g., wear mask, isolate, social distance, avoid sharing personal items, clean and disinfect "high touch" surfaces, and frequent handwashing) according to CDC guidelines.   5. The patient or parent/caregiver has the option to accept or refuse COVID monoclonal antibody treatment.  After reviewing this information with the patient, the patient has agreed  to receive one of the available covid 19 monoclonal antibodies and will be provided an appropriate fact sheet prior to infusion.  Nicolasa Ducking, NP 06/02/2020 9:27 AM

## 2020-06-02 NOTE — Progress Notes (Signed)
  Diagnosis: COVID-19  Physician: Dr. Wright   Procedure: [facility] Infusion [facility] Med: casirivimab\imdevimab infusion - Provided patient with casirivimab\imdevimab fact sheet for patients, parents and caregivers prior to infusion.  Complications: No immediate complications noted.  Discharge: Discharged home   Cassandra Harrison  [name] 06/02/2020 , 

## 2020-06-07 ENCOUNTER — Encounter: Payer: Medicaid Other | Admitting: Obstetrics and Gynecology

## 2020-06-11 NOTE — L&D Delivery Note (Signed)
Delivery Note Called to bedside and patient complete and pushing. At 8:56 AM a viable female was delivered via Vaginal, Spontaneous (Presentation: Left Occiput Anterior).  APGAR:9,9; weight 3195g. Shoulder cord noted at delivery, delivered through. Placenta status: Spontaneous, Intact.  Cord: 3 vessels with the following complications: None.   Anesthesia: Epidural Episiotomy: None Lacerations: None Est. Blood Loss (mL): 100  Mom to postpartum.  Baby to Couplet care / Skin to Skin.  [name] 06/23/2020, 9:27 AM

## 2020-06-14 ENCOUNTER — Telehealth (HOSPITAL_COMMUNITY): Payer: Self-pay | Admitting: *Deleted

## 2020-06-14 ENCOUNTER — Encounter (HOSPITAL_COMMUNITY): Payer: Self-pay | Admitting: *Deleted

## 2020-06-14 ENCOUNTER — Ambulatory Visit (INDEPENDENT_AMBULATORY_CARE_PROVIDER_SITE_OTHER): Payer: Medicaid Other | Admitting: Obstetrics & Gynecology

## 2020-06-14 ENCOUNTER — Other Ambulatory Visit: Payer: Self-pay

## 2020-06-14 VITALS — BP 111/73 | HR 92 | Wt 239.7 lb

## 2020-06-14 DIAGNOSIS — J4520 Mild intermittent asthma, uncomplicated: Secondary | ICD-10-CM

## 2020-06-14 DIAGNOSIS — Z6791 Unspecified blood type, Rh negative: Secondary | ICD-10-CM

## 2020-06-14 DIAGNOSIS — O99333 Smoking (tobacco) complicating pregnancy, third trimester: Secondary | ICD-10-CM

## 2020-06-14 DIAGNOSIS — O26899 Other specified pregnancy related conditions, unspecified trimester: Secondary | ICD-10-CM

## 2020-06-14 DIAGNOSIS — O99330 Smoking (tobacco) complicating pregnancy, unspecified trimester: Secondary | ICD-10-CM

## 2020-06-14 DIAGNOSIS — O99210 Obesity complicating pregnancy, unspecified trimester: Secondary | ICD-10-CM

## 2020-06-14 DIAGNOSIS — Z3A39 39 weeks gestation of pregnancy: Secondary | ICD-10-CM

## 2020-06-14 NOTE — Addendum Note (Signed)
Addended by: Jerene Bears on: 06/14/2020 11:14 AM   Modules accepted: Orders

## 2020-06-14 NOTE — Telephone Encounter (Signed)
Preadmission screen  

## 2020-06-14 NOTE — Progress Notes (Signed)
Patient reports fetal movement with some pressure. 

## 2020-06-14 NOTE — Progress Notes (Signed)
   PRENATAL VISIT NOTE  Subjective:  Cassandra Harrison is a 30 y.o. G2P1001 at [redacted]w[redacted]d being seen today for ongoing prenatal care.  She is currently monitored for the following issues for this high-risk pregnancy and has Supervision of other normal pregnancy, antepartum; Rh negative state in antepartum period; Obesity in pregnancy; High risk pregnancy due to smoking in third trimester; Asthma; Morbid obesity (HCC); and COVID-19 virus infection on their problem list.  Patient reports no complaints.  Contractions: Not present. Vag. Bleeding: None.  Movement: Present. Denies leaking of fluid.   The following portions of the patient's history were reviewed and updated as appropriate: allergies, current medications, past family history, past medical history, past social history, past surgical history and problem list.   Objective:   Vitals:   06/14/20 1004  BP: 111/73  Pulse: 92  Weight: 239 lb 11.2 oz (108.7 kg)    Fetal Status: Fetal Heart Rate (bpm): 134   Movement: Present     General:  Alert, oriented and cooperative. Patient is in no acute distress.  Skin: Skin is warm and dry. No rash noted.   Cardiovascular: Normal heart rate noted  Respiratory: Normal respiratory effort, no problems with respiration noted  Abdomen: Soft, gravid, appropriate for gestational age.  Pain/Pressure: Present     Pelvic: Could not fully reach cervix but it is soft.        Extremities: Normal range of motion.  Edema: Trace  Mental Status: Normal mood and affect. Normal behavior. Normal judgment and thought content.   Assessment and Plan:  Pregnancy: G2P1001 at [redacted]w[redacted]d 1. [redacted] weeks gestation of pregnancy -reviewed 36 weeks labs -Induction scheduled for 41 weeks -BPP order placed to be done after 40  2. Obesity in pregnancy  3. Mild intermittent asthma without complication  4. High risk pregnancy due to smoking in third trimester  5. Rh negative state in antepartum period  6. Recent covid  diagnosis  Term labor symptoms and general obstetric precautions including but not limited to vaginal bleeding, contractions, leaking of fluid and fetal movement were reviewed in detail with the patient. Please refer to After Visit Summary for other counseling recommendations.   Return in about 1 week (around 06/21/2020) for Office ob visit (MD or APP).  Future Appointments  Date Time Provider Department Center  06/21/2020  9:45 AM Adam Phenix, MD [facility] None    Jerene Bears, MD

## 2020-06-15 ENCOUNTER — Other Ambulatory Visit: Payer: Self-pay | Admitting: Advanced Practice Midwife

## 2020-06-16 ENCOUNTER — Other Ambulatory Visit (HOSPITAL_COMMUNITY): Payer: Self-pay | Admitting: Advanced Practice Midwife

## 2020-06-16 ENCOUNTER — Other Ambulatory Visit: Payer: Self-pay | Admitting: Obstetrics & Gynecology

## 2020-06-20 ENCOUNTER — Encounter: Payer: Self-pay | Admitting: *Deleted

## 2020-06-20 ENCOUNTER — Ambulatory Visit: Payer: Medicaid Other | Admitting: *Deleted

## 2020-06-20 ENCOUNTER — Other Ambulatory Visit: Payer: Self-pay

## 2020-06-20 ENCOUNTER — Ambulatory Visit: Payer: Medicaid Other | Attending: Obstetrics & Gynecology

## 2020-06-20 DIAGNOSIS — O99210 Obesity complicating pregnancy, unspecified trimester: Secondary | ICD-10-CM | POA: Diagnosis present

## 2020-06-20 DIAGNOSIS — Z3A39 39 weeks gestation of pregnancy: Secondary | ICD-10-CM

## 2020-06-20 DIAGNOSIS — O99213 Obesity complicating pregnancy, third trimester: Secondary | ICD-10-CM | POA: Diagnosis not present

## 2020-06-20 DIAGNOSIS — O99333 Smoking (tobacco) complicating pregnancy, third trimester: Secondary | ICD-10-CM

## 2020-06-20 DIAGNOSIS — Z6791 Unspecified blood type, Rh negative: Secondary | ICD-10-CM | POA: Diagnosis not present

## 2020-06-20 DIAGNOSIS — J45909 Unspecified asthma, uncomplicated: Secondary | ICD-10-CM | POA: Insufficient documentation

## 2020-06-20 DIAGNOSIS — O99513 Diseases of the respiratory system complicating pregnancy, third trimester: Secondary | ICD-10-CM | POA: Diagnosis not present

## 2020-06-20 DIAGNOSIS — Z3A40 40 weeks gestation of pregnancy: Secondary | ICD-10-CM | POA: Diagnosis not present

## 2020-06-20 DIAGNOSIS — O480 Post-term pregnancy: Secondary | ICD-10-CM

## 2020-06-20 NOTE — Procedures (Signed)
Cassandra Harrison May 22, 1991 [redacted]w[redacted]d  Fetus A Non-Stress Test Interpretation for 06/20/20  Indication: Smoker, obesity  Fetal Heart Rate A Mode: External Variability: Moderate Accelerations: 15 x 15 Decelerations: None Multiple birth?: No  Uterine Activity Mode: Palpation,Toco Contraction Frequency (min): None Resting Tone Palpated: Relaxed Resting Time: Adequate  Interpretation (Fetal Testing) Nonstress Test Interpretation: Reactive Comments: Dr. Judeth Cornfield reviewed tracing.

## 2020-06-21 ENCOUNTER — Ambulatory Visit (INDEPENDENT_AMBULATORY_CARE_PROVIDER_SITE_OTHER): Payer: Medicaid Other | Admitting: Obstetrics and Gynecology

## 2020-06-21 ENCOUNTER — Other Ambulatory Visit: Payer: Self-pay

## 2020-06-21 ENCOUNTER — Encounter: Payer: Self-pay | Admitting: Obstetrics and Gynecology

## 2020-06-21 VITALS — BP 119/78 | HR 88 | Wt 242.0 lb

## 2020-06-21 DIAGNOSIS — Z3480 Encounter for supervision of other normal pregnancy, unspecified trimester: Secondary | ICD-10-CM

## 2020-06-21 DIAGNOSIS — Z6791 Unspecified blood type, Rh negative: Secondary | ICD-10-CM

## 2020-06-21 DIAGNOSIS — O99210 Obesity complicating pregnancy, unspecified trimester: Secondary | ICD-10-CM

## 2020-06-21 DIAGNOSIS — O26899 Other specified pregnancy related conditions, unspecified trimester: Secondary | ICD-10-CM

## 2020-06-21 NOTE — Progress Notes (Signed)
   PRENATAL VISIT NOTE  Subjective:  Cassandra Harrison is a 30 y.o. G2P1001 at [redacted]w[redacted]d being seen today for ongoing prenatal care.  She is currently monitored for the following issues for this low-risk pregnancy and has Supervision of other normal pregnancy, antepartum; Rh negative state in antepartum period; Obesity in pregnancy; High risk pregnancy due to smoking in third trimester; Asthma; Morbid obesity (HCC); and COVID-19 virus infection on their problem list.  Patient reports no complaints.  Contractions: Not present. Vag. Bleeding: None.  Movement: Present. Denies leaking of fluid.   The following portions of the patient's history were reviewed and updated as appropriate: allergies, current medications, past family history, past medical history, past social history, past surgical history and problem list.   Objective:   Vitals:   06/21/20 1002  BP: 119/78  Pulse: 88  Weight: 242 lb (109.8 kg)    Fetal Status: Fetal Heart Rate (bpm): 135 Fundal Height: 39 cm Movement: Present     General:  Alert, oriented and cooperative. Patient is in no acute distress.  Skin: Skin is warm and dry. No rash noted.   Cardiovascular: Normal heart rate noted  Respiratory: Normal respiratory effort, no problems with respiration noted  Abdomen: Soft, gravid, appropriate for gestational age.  Pain/Pressure: Present     Pelvic: Cervical exam deferred        Extremities: Normal range of motion.     Mental Status: Normal mood and affect. Normal behavior. Normal judgment and thought content.   Assessment and Plan:  Pregnancy: G2P1001 at [redacted]w[redacted]d 1. Supervision of other normal pregnancy, antepartum Patient is doing well without complaints Scheduled for IOL on 06/25/20 BPP 10/10 on 06/20/20  2. Rh negative state in antepartum period S/p rhogam  3. Obesity in pregnancy ASA  Term labor symptoms and general obstetric precautions including but not limited to vaginal bleeding, contractions, leaking of fluid  and fetal movement were reviewed in detail with the patient. Please refer to After Visit Summary for other counseling recommendations.   Return in about 6 weeks (around 08/02/2020) for postpartum.  Future Appointments  Date Time Provider Department Center  06/25/2020  7:15 AM [facility] SCHED ROOM [facility] None    Catalina Antigua, MD

## 2020-06-22 ENCOUNTER — Inpatient Hospital Stay (HOSPITAL_COMMUNITY)
Admission: AD | Admit: 2020-06-22 | Discharge: 2020-06-25 | DRG: 807 | Disposition: A | Discharge status: Home / Self Care | Payer: Medicaid Other | Attending: Family Medicine | Admitting: Family Medicine

## 2020-06-22 ENCOUNTER — Encounter (HOSPITAL_COMMUNITY): Payer: Self-pay | Admitting: Obstetrics and Gynecology

## 2020-06-22 ENCOUNTER — Other Ambulatory Visit: Payer: Self-pay

## 2020-06-22 DIAGNOSIS — O4292 Full-term premature rupture of membranes, unspecified as to length of time between rupture and onset of labor: Principal | ICD-10-CM | POA: Diagnosis present

## 2020-06-22 DIAGNOSIS — O99214 Obesity complicating childbirth: Secondary | ICD-10-CM | POA: Diagnosis present

## 2020-06-22 DIAGNOSIS — J4520 Mild intermittent asthma, uncomplicated: Secondary | ICD-10-CM | POA: Diagnosis present

## 2020-06-22 DIAGNOSIS — F17210 Nicotine dependence, cigarettes, uncomplicated: Secondary | ICD-10-CM | POA: Diagnosis present

## 2020-06-22 DIAGNOSIS — O480 Post-term pregnancy: Secondary | ICD-10-CM | POA: Diagnosis present

## 2020-06-22 DIAGNOSIS — Z3A40 40 weeks gestation of pregnancy: Secondary | ICD-10-CM | POA: Diagnosis not present

## 2020-06-22 DIAGNOSIS — O99334 Smoking (tobacco) complicating childbirth: Secondary | ICD-10-CM | POA: Diagnosis present

## 2020-06-22 DIAGNOSIS — O99333 Smoking (tobacco) complicating pregnancy, third trimester: Secondary | ICD-10-CM

## 2020-06-22 DIAGNOSIS — O9952 Diseases of the respiratory system complicating childbirth: Secondary | ICD-10-CM | POA: Diagnosis present

## 2020-06-22 DIAGNOSIS — J45909 Unspecified asthma, uncomplicated: Secondary | ICD-10-CM | POA: Diagnosis present

## 2020-06-22 DIAGNOSIS — Z8616 Personal history of COVID-19: Secondary | ICD-10-CM | POA: Diagnosis not present

## 2020-06-22 DIAGNOSIS — Z6791 Unspecified blood type, Rh negative: Secondary | ICD-10-CM

## 2020-06-22 DIAGNOSIS — U071 COVID-19: Secondary | ICD-10-CM | POA: Diagnosis present

## 2020-06-22 DIAGNOSIS — O26899 Other specified pregnancy related conditions, unspecified trimester: Secondary | ICD-10-CM

## 2020-06-22 DIAGNOSIS — O26893 Other specified pregnancy related conditions, third trimester: Secondary | ICD-10-CM | POA: Diagnosis present

## 2020-06-22 DIAGNOSIS — O99210 Obesity complicating pregnancy, unspecified trimester: Secondary | ICD-10-CM

## 2020-06-22 DIAGNOSIS — Z3480 Encounter for supervision of other normal pregnancy, unspecified trimester: Secondary | ICD-10-CM

## 2020-06-22 DIAGNOSIS — Z91040 Latex allergy status: Secondary | ICD-10-CM | POA: Diagnosis not present

## 2020-06-22 LAB — TYPE AND SCREEN
ABO/RH(D): B NEG
Antibody Screen: NEGATIVE

## 2020-06-22 LAB — CBC
HCT: 36.8 % (ref 36.0–46.0)
Hemoglobin: 12 g/dL (ref 12.0–15.0)
MCH: 27.9 pg (ref 26.0–34.0)
MCHC: 32.6 g/dL (ref 30.0–36.0)
MCV: 85.6 fL (ref 80.0–100.0)
Platelets: 224 10*3/uL (ref 150–400)
RBC: 4.3 MIL/uL (ref 3.87–5.11)
RDW: 13.9 % (ref 11.5–15.5)
WBC: 8.8 10*3/uL (ref 4.0–10.5)
nRBC: 0 % (ref 0.0–0.2)

## 2020-06-22 LAB — AMNISURE RUPTURE OF MEMBRANE (ROM) NOT AT ARMC: Amnisure ROM: POSITIVE

## 2020-06-22 LAB — POCT FERN TEST: POCT Fern Test: NEGATIVE

## 2020-06-22 MED ORDER — LIDOCAINE HCL (PF) 1 % IJ SOLN: 30.0000 mL | INTRAMUSCULAR | Status: DC | PRN

## 2020-06-22 MED ORDER — ONDANSETRON HCL 4 MG/2ML IJ SOLN: 4.0000 mg | Freq: Four times a day (QID) | INTRAMUSCULAR | Status: DC | PRN

## 2020-06-22 MED ORDER — TERBUTALINE SULFATE 1 MG/ML IJ SOLN: 0.2500 mg | Freq: Once | INTRAMUSCULAR | Status: DC | PRN

## 2020-06-22 MED ORDER — LACTATED RINGERS IV SOLN: INTRAVENOUS | Status: DC

## 2020-06-22 MED ORDER — MISOPROSTOL 50MCG HALF TABLET
50.0000 ug | ORAL_TABLET | ORAL | Status: DC | PRN
  Administered 2020-06-22 (×2): 50 ug via BUCCAL
  Filled 2020-06-22: qty 1

## 2020-06-22 MED ORDER — EPHEDRINE 5 MG/ML INJ: 10.0000 mg | INTRAVENOUS | Status: DC | PRN

## 2020-06-22 MED ORDER — FENTANYL CITRATE (PF) 100 MCG/2ML IJ SOLN
100.0000 ug | INTRAMUSCULAR | Status: DC | PRN
  Administered 2020-06-23: 100 ug via INTRAVENOUS
  Filled 2020-06-22: qty 2

## 2020-06-22 MED ORDER — OXYCODONE-ACETAMINOPHEN 5-325 MG PO TABS
1.0000 | ORAL_TABLET | ORAL | Status: DC | PRN
Start: 2020-06-22 — End: 2020-06-23

## 2020-06-22 MED ORDER — OXYTOCIN BOLUS FROM INFUSION
333.0000 mL | Freq: Once | INTRAVENOUS | Status: AC
  Administered 2020-06-23: 333 mL via INTRAVENOUS

## 2020-06-22 MED ORDER — OXYCODONE-ACETAMINOPHEN 5-325 MG PO TABS: 2.0000 | ORAL_TABLET | ORAL | Status: DC | PRN

## 2020-06-22 MED ORDER — LACTATED RINGERS IV SOLN
500.0000 mL | Freq: Once | INTRAVENOUS | Status: AC
  Administered 2020-06-23: 500 mL via INTRAVENOUS

## 2020-06-22 MED ORDER — PHENYLEPHRINE 40 MCG/ML (10ML) SYRINGE FOR IV PUSH (FOR BLOOD PRESSURE SUPPORT)
80.0000 ug | PREFILLED_SYRINGE | INTRAVENOUS | Status: DC | PRN
  Filled 2020-06-22: qty 10

## 2020-06-22 MED ORDER — OXYTOCIN-SODIUM CHLORIDE 30-0.9 UT/500ML-% IV SOLN
1.0000 m[IU]/min | INTRAVENOUS | Status: DC
  Administered 2020-06-22: 2 m[IU]/min via INTRAVENOUS
  Filled 2020-06-22: qty 500

## 2020-06-22 MED ORDER — MISOPROSTOL 25 MCG QUARTER TABLET: 25.0000 ug | ORAL_TABLET | ORAL | Status: DC | PRN

## 2020-06-22 MED ORDER — ACETAMINOPHEN 325 MG PO TABS: 650.0000 mg | ORAL_TABLET | ORAL | Status: DC | PRN

## 2020-06-22 MED ORDER — SOD CITRATE-CITRIC ACID 500-334 MG/5ML PO SOLN: 30.0000 mL | ORAL | Status: DC | PRN

## 2020-06-22 MED ORDER — OXYTOCIN-SODIUM CHLORIDE 30-0.9 UT/500ML-% IV SOLN: 2.5000 [IU]/h | INTRAVENOUS | Status: DC

## 2020-06-22 MED ORDER — FLEET ENEMA 7-19 GM/118ML RE ENEM: 1.0000 | ENEMA | Freq: Every day | RECTAL | Status: DC | PRN

## 2020-06-22 MED ORDER — PHENYLEPHRINE 40 MCG/ML (10ML) SYRINGE FOR IV PUSH (FOR BLOOD PRESSURE SUPPORT)
80.0000 ug | PREFILLED_SYRINGE | INTRAVENOUS | Status: DC | PRN
  Administered 2020-06-23 (×2): 80 ug via INTRAVENOUS

## 2020-06-22 MED ORDER — FENTANYL-BUPIVACAINE-NACL 0.5-0.125-0.9 MG/250ML-% EP SOLN
12.0000 mL/h | EPIDURAL | Status: DC | PRN
  Filled 2020-06-22: qty 250

## 2020-06-22 MED ORDER — LACTATED RINGERS IV SOLN: 500.0000 mL | INTRAVENOUS | Status: DC | PRN

## 2020-06-22 MED ORDER — DIPHENHYDRAMINE HCL 50 MG/ML IJ SOLN: 12.5000 mg | INTRAMUSCULAR | Status: DC | PRN

## 2020-06-22 MED ORDER — MISOPROSTOL 50MCG HALF TABLET
ORAL_TABLET | ORAL | Status: AC
  Filled 2020-06-22: qty 1

## 2020-06-22 NOTE — H&P (Addendum)
OBSTETRIC ADMISSION HISTORY AND PHYSICAL  Cassandra Harrison is a 30 y.o. female G2P1001 with IUP at [redacted]w[redacted]d by LMP presenting for PROM. She reports LOF this morning around 6 AM, amnisure positive in MAU. She reports +FMs, no VB, no blurry vision, headaches or peripheral edema, and RUQ pain.  She plans on breast feeding. She is unsure of the method she prefers for birth control.  She received her prenatal care at [facility] at [facility].   Dating: By LMP --->  Estimated Date of Delivery: 06/18/20  Sono:    '@[redacted]w[redacted]d'$ , CWD, anatomy view limited due to fetal position (f/u subsequently WNL), transverse, head to maternal right presentation, anterior placental lie, 352g, 52% EFW  Prenatal History/Complications:  Smoking in 3rd trimester Mild intermittent asthma Morbid Obesity  COVID-19 infection in 3rd trimester  Rh negative state in antepartum period  Asthma  Past Medical History: Past Medical History:  Diagnosis Date  . Asthma    last used 11/29/11  . Bulging disc   . Chronic back pain   . Complication of anesthesia    itching with epidural  . Constipation   . COVID-19 virus infection 05/2020  . Migraine   . Morbid obesity ([place])     Past Surgical History: Past Surgical History:  Procedure Laterality Date  . WISDOM TOOTH EXTRACTION      Obstetrical History: OB History    Gravida  2   Para  1   Term  1   Preterm  0   AB  0   Living  1     SAB  0   IAB  0   Ectopic  0   Multiple  0   Live Births  1           Social History Social History   Socioeconomic History  . Marital status: Single    Spouse name: Not on file  . Number of children: Not on file  . Years of education: Not on file  . Highest education level: Not on file  Occupational History  . Not on file  Tobacco Use  . Smoking status: Current Every Day Smoker    Packs/day: 0.50  . Smokeless tobacco: Never Used  Vaping Use  . Vaping Use: Never used  Substance and Sexual Activity  . Alcohol use: Not  Currently    Comment: occ  . Drug use: No  . Sexual activity: Not Currently    Birth control/protection: None  Other Topics Concern  . Not on file  Social History Narrative  . Not on file   Social Determinants of Health   Financial Resource Strain: Not on file  Food Insecurity: Not on file  Transportation Needs: Not on file  Physical Activity: Not on file  Stress: Not on file  Social Connections: Not on file    Family History: Family History  Problem Relation Age of Onset  . Diabetes Mother   . COPD Mother   . Heart disease Mother   . Cancer Mother        throat cancer  . COPD Father   . Heart disease Father   . Cancer Sister   . Cancer Paternal Aunt   . Diabetes Maternal Grandmother   . Cancer Maternal Grandmother   . COPD Paternal Grandmother   . Heart disease Paternal Grandfather   . Other Neg Hx     Allergies: Allergies  Allergen Reactions  . Olive Oil Nausea And Vomiting    She can not have  anything with olives in it  . Latex Rash    Patient states that she is allergic to latex condoms.  They give her a rash and a yeast infection.    Medications Prior to Admission  Medication Sig Dispense Refill Last Dose  . aspirin 81 MG chewable tablet Chew 1 tablet (81 mg total) by mouth daily. 30 tablet 5 06/21/2020 at Unknown time  . docusate sodium (COLACE) 100 MG capsule Take 1 capsule (100 mg total) by mouth 2 (two) times daily. 60 capsule 5 Past Week at Unknown time  . ferrous sulfate 325 (65 FE) MG tablet Take 1 tablet (325 mg total) by mouth 2 (two) times daily with a meal. 60 tablet 5 06/21/2020 at Unknown time  . Prenatal MV-Min-FA-Omega-3 (PRENATAL GUMMIES/DHA & FA) 0.4-32.5 MG CHEW Chew 3 tablets by mouth daily. 90 tablet 12 06/21/2020 at Unknown time  . albuterol (VENTOLIN HFA) 108 (90 Base) MCG/ACT inhaler Inhale 1-2 puffs into the lungs every 6 (six) hours as needed for wheezing or shortness of breath. 1 each 0   . Blood Pressure Monitoring (BLOOD PRESSURE  KIT) DEVI 1 kit by Does not apply route as needed. (Patient not taking: No sig reported) 1 each 0   . calcium carbonate (TUMS EX) 750 MG chewable tablet Chew 1 tablet by mouth daily. (Patient not taking: No sig reported)     . Elastic Bandages & Supports (COMFORT FIT MATERNITY SUPP SM) MISC Wear as directed. (Patient not taking: No sig reported) 1 each 0   . guaiFENesin (MUCINEX) 600 MG 12 hr tablet Take 1 tablet (600 mg total) by mouth 2 (two) times daily. (Patient not taking: No sig reported) 14 tablet 0   . guaiFENesin-dextromethorphan (ROBITUSSIN DM) 100-10 MG/5ML syrup Take 5 mLs by mouth every 4 (four) hours as needed for cough. (Patient not taking: Reported on 06/14/2020)      Review of Systems   All systems reviewed and negative except as stated in HPI  Blood pressure (!) 106/59, pulse 68, temperature 98.7 F (37.1 C), temperature source Oral, resp. rate 15, height $RemoveBe'5\' 2"'vIaJNvwTd$  (1.575 m), weight 108 kg, last menstrual period 09/21/2019, SpO2 98 %. General appearance: alert, cooperative, appears stated age, no distress and morbidly obese Lungs: clear to auscultation bilaterally Heart: regular rate and rhythm Abdomen: soft, non-tender; bowel sounds normal Pelvic: no lesions Extremities: Homans sign is negative, no sign of DVT DTR's 2+ Presentation: cephalic Fetal monitoringBaseline: 125 bpm, Variability: Good {> 6 bpm) and Accelerations: Reactive, Decelerations none Uterine activity: Irritability Dilation: 2 Effacement (%): 50 Station: -2 Exam by:: Mahoney   Prenatal labs: ABO, Rh: --/--/B NEG (01/12 1148) Antibody: NEG (01/12 1148) Rubella: 2.49 (07/15 1159) RPR: Non Reactive (10/12 0923)  HBsAg: Negative (07/15 1159)  HIV: Non Reactive (10/12 0923)  GBS: Negative/-- (12/14 1037)  1 hr Glucola: Passed 82, 145, 102 Genetic screening: Panorama LR, Horizon (-) Anatomy US: WNL  Prenatal Transfer Tool  Maternal Diabetes: No Genetic Screening: Normal Maternal Ultrasounds/Referrals:  Normal Fetal Ultrasounds or other Referrals:  None Maternal Substance Abuse:  Yes:  Type: Smoker Significant Maternal Medications:  Meds include: Other: Aspirin  Significant Maternal Lab Results: Group B Strep negative and Rh negative  Results for orders placed or performed during the hospital encounter of 06/22/20 (from the past 24 hour(s))  [facility] Time: 06/22/20  8:59 AM  Result Value Ref Range   POCT Fern Test Negative = intact amniotic membranes   Amnisure rupture of membrane (  rom)not at [facility]   Collection Time: 06/22/20  9:54 AM  Result Value Ref Range   Amnisure ROM POSITIVE   CBC   Collection Time: 06/22/20 11:46 AM  Result Value Ref Range   WBC 8.8 4.0 - 10.5 K/uL   RBC 4.30 3.87 - 5.11 MIL/uL   Hemoglobin 12.0 12.0 - 15.0 g/dL   HCT 36.8 36.0 - 46.0 %   MCV 85.6 80.0 - 100.0 fL   MCH 27.9 26.0 - 34.0 pg   MCHC 32.6 30.0 - 36.0 g/dL   RDW 13.9 11.5 - 15.5 %   Platelets 224 150 - 400 K/uL   nRBC 0.0 0.0 - 0.2 %  Type and screen   Collection Time: 06/22/20 11:48 AM  Result Value Ref Range   ABO/RH(D) B NEG    Antibody Screen NEG    Sample Expiration      06/25/2020,2359 Performed at [facility], [place]. [address], [place], [place] [address]     Patient Active Problem List   Diagnosis Date Noted  . Post-dates pregnancy 06/22/2020  . Asthma   . Morbid obesity ([place])   . COVID-19 virus infection 05/2020  . Obesity in pregnancy 04/05/2020  . High risk pregnancy due to smoking in third trimester 04/05/2020  . Rh negative state in antepartum period 02/23/2020  . Supervision of other normal pregnancy, antepartum 12/24/2019    Assessment/Plan:  Cassandra Harrison is a 30 y.o. G2P1001 at [redacted]w[redacted]d here for PROM.   #Labor: Patient here for PROM. CE is unchanged from last prenatal visit. Patient opts for IOL, will augment with cytotec and FB now, transition to pitocin as able. #Pain: PRN #FWB: cat I #ID:  GBS negative  #MOF: breast #MOC: unsure. She  has used nuva ring and OCPs in the past, but does not like birth control, prefers not to use it. As patient is a current smoker and BMI >40, estrogen-containing contraceptives are not ideal. Counseled on birth control options and efficacy. #Circ:  N/a #COVID-19 infection in third trimester: patient was found to be positive on 06/01/20, now out of infectious window. Feels well overall, VSS, afebrile. She has an occasional lingering cough. #Rh negative status: patient to receive PP rhogam prior to discharge.  Gladys Damme, MD  06/22/2020, 2:40 PM   Midwife attestation: I have seen and examined this patient; I agree with above documentation in the resident's note.   PE: Gen: calm comfortable, NAD Resp: normal effort and rate Abd: gravid  ROS, labs, PMH reviewed  Assessment/Plan: [redacted] weeks gestation Labor: latent, PROM at term FWB: Cat I GBS: neg Admit to LD Cervical ripening/IOL Anticipate SVD  Julianne Handler, CNM  06/22/2020, 3:07 PM

## 2020-06-22 NOTE — Progress Notes (Signed)
Cassandra Harrison is a 30 y.o. G2P1001 at [redacted]w[redacted]d by LMP admitted for PROM  Subjective: Patient comfortable.  Objective: BP (!) 97/51   Pulse 70   Temp 98.2 F (36.8 C) (Oral)   Resp 15   Ht 5\' 2"  (1.575 m)   Wt 108 kg   LMP 09/21/2019 (Approximate)   SpO2 98%   BMI 43.53 kg/m  No intake/output data recorded. No intake/output data recorded.  FHT:  FHR: 155 bpm, variability: moderate,  accelerations:  Present,  decelerations:  Absent UC:   irregular, every 8-10 minutes SVE:   Dilation: 4 Effacement (%): 50 Station: -2 Exam by:: [email or web address] RNC  Labs: Lab Results  Component Value Date   WBC 8.8 06/22/2020   HGB 12.0 06/22/2020   HCT 36.8 06/22/2020   MCV 85.6 06/22/2020   PLT 224 06/22/2020    Assessment / Plan: Induction of labor due to PROM, progressing well.  Labor: S/p cytotec x1, FB out at 1819.  Making good progress with dilation, still thick on exam, will place another cytotec. Hopefully able to start pitocin at next check. Fetal Wellbeing:  Category I Pain Control:  PRN I/D:  GBS neg Anticipated MOD:  NSVD  08/20/2020 06/22/2020, 7:45 PM

## 2020-06-22 NOTE — MAU Provider Note (Signed)
S: Ms. Cassandra Harrison is a 30 y.o. G2P1001 at [redacted]w[redacted]d  who presents to MAU today complaining of leaking of fluid since 0600. She endorses vaginal bleeding. She denies contractions. She reports no fetal movement until arriving to MAU triage room.    O: BP 112/62 (BP Location: Right Arm)   Pulse 79   Temp 98 F (36.7 C)   Resp 18   Ht 5\' 2"  (1.575 m)   Wt 108.1 kg   LMP 09/21/2019 (Approximate)   SpO2 99%   BMI 43.59 kg/m  GENERAL: Well-developed, well-nourished female in no acute distress.  HEAD: Normocephalic, atraumatic.  CHEST: Normal effort of breathing, regular heart rate ABDOMEN: Soft, nontender, gravid PELVIC: Amnisure done by Amnisure.   Cervical exam:  Deferred  Fetal Monitoring: Baseline: 125 bpm Variability: moderate variability Accelerations: present Decelerations: absent Contractions: irregular UC's with UI noted  Results for orders placed or performed during the hospital encounter of 06/22/20 (from the past 24 hour(s))  Fern Test     Status: None   Collection Time: 06/22/20  8:59 AM  Result Value Ref Range   POCT Fern Test Negative = intact amniotic membranes   Amnisure rupture of membrane (rom)not at [facility]     Status: None   Collection Time: 06/22/20  9:54 AM  Result Value Ref Range   Amnisure ROM POSITIVE      A: SIUP at [redacted]w[redacted]d  SROM  P: RN to call labor team for admission orders  [redacted]w[redacted]d, CNM 06/22/2020, 11:40 AM

## 2020-06-22 NOTE — MAU Note (Signed)
Presents with c/o mild ctxs and LOF.  Reports began LOF between 0600 and 0630 and saw blood on panties.  Reports no FM since last night, but just felt movement now while in triage.

## 2020-06-22 NOTE — Progress Notes (Signed)
Patient ID: Cassandra Harrison, female   DOB: 09-25-1990, 30 y.o.   MRN: [id number]  S/p second cytotec at 1847 (prior to that had a first dose of cyto and a cervical foley that has come out); feels well  BP 97/51, P 70 FHR 120s, +accels, no decels Ctx irreg, mild Cx deferred (was 4/50/vtx -2 with cyto placement)  IUP@40 .4wks PROM x 15h Cx favorable  Plan to start Pit 2x2 ~2300 and will titrate to active labor Anticipate vag del  [name] [facility] 06/22/2020 9:32 PM

## 2020-06-23 ENCOUNTER — Inpatient Hospital Stay (HOSPITAL_COMMUNITY): Payer: Medicaid Other | Admitting: Anesthesiology

## 2020-06-23 ENCOUNTER — Encounter (HOSPITAL_COMMUNITY): Payer: Self-pay | Admitting: Family Medicine

## 2020-06-23 DIAGNOSIS — O480 Post-term pregnancy: Secondary | ICD-10-CM

## 2020-06-23 DIAGNOSIS — Z3A40 40 weeks gestation of pregnancy: Secondary | ICD-10-CM

## 2020-06-23 LAB — RPR: RPR Ser Ql: NONREACTIVE

## 2020-06-23 MED ORDER — SIMETHICONE 80 MG PO CHEW: 80.0000 mg | CHEWABLE_TABLET | ORAL | Status: DC | PRN

## 2020-06-23 MED ORDER — MEASLES, MUMPS & RUBELLA VAC IJ SOLR: 0.5000 mL | Freq: Once | INTRAMUSCULAR | Status: DC

## 2020-06-23 MED ORDER — SENNOSIDES-DOCUSATE SODIUM 8.6-50 MG PO TABS
2.0000 | ORAL_TABLET | ORAL | Status: DC
  Administered 2020-06-23: 2 via ORAL
  Filled 2020-06-23: qty 2

## 2020-06-23 MED ORDER — WITCH HAZEL-GLYCERIN EX PADS: 1.0000 "application " | MEDICATED_PAD | CUTANEOUS | Status: DC | PRN

## 2020-06-23 MED ORDER — ACETAMINOPHEN 325 MG PO TABS: 650.0000 mg | ORAL_TABLET | ORAL | Status: DC | PRN

## 2020-06-23 MED ORDER — PRENATAL MULTIVITAMIN CH
1.0000 | ORAL_TABLET | Freq: Every day | ORAL | Status: DC
  Administered 2020-06-23 – 2020-06-24 (×2): 1 via ORAL
  Filled 2020-06-23 (×2): qty 1

## 2020-06-23 MED ORDER — ONDANSETRON HCL 4 MG/2ML IJ SOLN: 4.0000 mg | INTRAMUSCULAR | Status: DC | PRN

## 2020-06-23 MED ORDER — DIBUCAINE (PERIANAL) 1 % EX OINT: 1.0000 "application " | TOPICAL_OINTMENT | CUTANEOUS | Status: DC | PRN

## 2020-06-23 MED ORDER — BENZOCAINE-MENTHOL 20-0.5 % EX AERO: 1.0000 "application " | INHALATION_SPRAY | CUTANEOUS | Status: DC | PRN

## 2020-06-23 MED ORDER — COCONUT OIL OIL: 1.0000 "application " | TOPICAL_OIL | Status: DC | PRN

## 2020-06-23 MED ORDER — INFLUENZA VAC SPLIT QUAD 0.5 ML IM SUSY: 0.5000 mL | PREFILLED_SYRINGE | INTRAMUSCULAR | Status: DC

## 2020-06-23 MED ORDER — IBUPROFEN 600 MG PO TABS
600.0000 mg | ORAL_TABLET | Freq: Four times a day (QID) | ORAL | Status: DC
  Administered 2020-06-23 – 2020-06-25 (×8): 600 mg via ORAL
  Filled 2020-06-23 (×8): qty 1

## 2020-06-23 MED ORDER — ONDANSETRON HCL 4 MG PO TABS: 4.0000 mg | ORAL_TABLET | ORAL | Status: DC | PRN

## 2020-06-23 MED ORDER — LIDOCAINE HCL (PF) 1 % IJ SOLN
INTRAMUSCULAR | Status: DC | PRN
  Administered 2020-06-23: 8 mL via EPIDURAL

## 2020-06-23 MED ORDER — DIPHENHYDRAMINE HCL 25 MG PO CAPS
25.0000 mg | ORAL_CAPSULE | Freq: Four times a day (QID) | ORAL | Status: DC | PRN
Start: 2020-06-23 — End: 2020-06-25

## 2020-06-23 MED ORDER — TETANUS-DIPHTH-ACELL PERTUSSIS 5-2.5-18.5 LF-MCG/0.5 IM SUSY: 0.5000 mL | PREFILLED_SYRINGE | Freq: Once | INTRAMUSCULAR | Status: DC

## 2020-06-23 MED ORDER — SODIUM CHLORIDE (PF) 0.9 % IJ SOLN
INTRAMUSCULAR | Status: DC | PRN
  Administered 2020-06-23: 12 mL/h via EPIDURAL

## 2020-06-23 NOTE — Lactation Note (Signed)
This note was copied from a baby's chart. Lactation Consultation Note  Patient Name: Cassandra Harrison [id number] Date: 06/23/2020 Reason for consult: L&D Initial assessment Age1 hour  Mother P50, Mother has flat and inverted nipples. Infant wanted to latch to breast but was unable to obtain a latch. Multiple attempts to latch infant. No sustained latch.   Teaching with mother about pumping when she gets to her room to help to pull nipple out and do hand expression. Mother receptive to all teaching.   Reviewed basic teaching with mother . Encouraged frequent STS.  Maternal Data Has patient been taught Hand Expression?: Yes Does the patient have breastfeeding experience prior to this delivery?: No  Feeding Feeding Type: Breast Fed  LATCH Score Latch: Too sleepy or reluctant, no latch achieved, no sucking elicited.  Audible Swallowing: None  Type of Nipple: Flat  Comfort (Breast/Nipple): Soft / non-tender  Hold (Positioning): Full assist, staff holds infant at breast  LATCH Score: 3  Interventions Interventions: Breast feeding basics reviewed;Assisted with latch;Skin to skin;Hand express  Lactation Tools Discussed/Used     Consult Status Consult Status: Follow-up Date: 06/23/20 Follow-up type: In-patient    Stevan Born [facility] 06/23/2020, 11:19 AM

## 2020-06-23 NOTE — Progress Notes (Signed)
Patient ID: Cassandra Harrison, female   DOB: 23-Apr-1991, 30 y.o.   MRN: [id number]  Appears to be sleeping, not disturbed  BP 90/54, P 57 FHR 120s, +accels, occ variables, avg LTV Ctx q 2-3 mins with Pit at 73mu/min Cx deferred (was 6-7cm per RN at 0545)  IUP@40 .5wks ROM >24h Active labor  Plan to have day team check cx after sign out to determine labor progress Anticipate vag del  [name] CNM 06/23/2020

## 2020-06-23 NOTE — Anesthesia Postprocedure Evaluation (Signed)
Anesthesia Post Note  Patient: Cassandra Harrison  Procedure(s) Performed: AN AD HOC LABOR EPIDURAL     Patient location during evaluation: Mother Baby Anesthesia Type: Epidural Level of consciousness: awake and alert Pain management: pain level controlled Vital Signs Assessment: post-procedure vital signs reviewed and stable Respiratory status: spontaneous breathing, nonlabored ventilation and respiratory function stable Cardiovascular status: stable Postop Assessment: no headache, no backache, epidural receding, no apparent nausea or vomiting, patient able to bend at knees, adequate PO intake and able to ambulate Anesthetic complications: no   No complications documented.  Last Vitals:  Vitals:   06/23/20 1100 06/23/20 1201  BP: (!) 102/58 (!) 102/56  Pulse: 88 83  Resp: 18 16  Temp: 36.9 C 36.9 C  SpO2: 98% 99%    Last Pain:  Vitals:   06/23/20 1201  TempSrc: Oral  PainSc: 8    Pain Goal: Patients Stated Pain Goal: 8 (06/23/20 1201)                 Laban Emperor

## 2020-06-23 NOTE — Lactation Note (Incomplete)
Lactation Consultation Note  Patient Name: Cassandra Harrison [id number] Date: 06/23/2020   Age:30 y.o.  Maternal Data    Feeding    LATCH Score                   Interventions    Lactation Tools Discussed/Used     Consult Status      Michel Bickers 06/23/2020, 10:17 AM

## 2020-06-23 NOTE — Anesthesia Procedure Notes (Signed)
Epidural Patient location during procedure: OB Start time: 06/23/2020 3:20 AM End time: 06/23/2020 3:26 AM  Staffing Anesthesiologist: Atilano Median, DO Performed: anesthesiologist   Preanesthetic Checklist Completed: patient identified, IV checked, site marked, risks and benefits discussed, surgical consent, monitors and equipment checked, pre-op evaluation and timeout performed  Epidural Patient position: sitting Prep: ChloraPrep Patient monitoring: heart rate, continuous pulse ox and blood pressure Approach: midline Location: L4-L5 Injection technique: LOR saline  Needle:  Needle type: Tuohy  Needle gauge: 17 G Needle length: 9 cm Needle insertion depth: 8 cm Catheter type: closed end flexible Catheter size: 20 Guage Catheter at skin depth: 13 cm Test dose: negative and 1.5% lidocaine  Assessment Events: blood not aspirated, injection not painful, no injection resistance and no paresthesia  Additional Notes Patient identified. Risks/Benefits/Options discussed with patient including but not limited to bleeding, infection, nerve damage, paralysis, failed block, incomplete pain control, headache, blood pressure changes, nausea, vomiting, reactions to medications, itching and postpartum back pain. Confirmed with bedside nurse the patient's most recent platelet count. Confirmed with patient that they are not currently taking any anticoagulation, have any bleeding history or any family history of bleeding disorders. Patient expressed understanding and wished to proceed. All questions were answered. Sterile technique was used throughout the entire procedure. Please see nursing notes for vital signs. Test dose was given through epidural catheter and negative prior to continuing to dose epidural or start infusion. Warning signs of high block given to the patient including shortness of breath, tingling/numbness in hands, complete motor block, or any concerning symptoms with  instructions to call for help. Patient was given instructions on fall risk and not to get out of bed. All questions and concerns addressed with instructions to call with any issues or inadequate analgesia.    Reason for block:procedure for pain

## 2020-06-23 NOTE — Anesthesia Preprocedure Evaluation (Signed)
Anesthesia Evaluation  Patient identified by MRN, date of birth, ID band Patient awake    Reviewed: NPO status , Patient's Chart, lab work & pertinent test results  Airway Mallampati: II  TM Distance: >3 FB Neck ROM: Full    Dental  (+) Teeth Intact   Pulmonary asthma , Current Smoker,    Pulmonary exam normal        Cardiovascular negative cardio ROS   Rhythm:Regular Rate:Normal     Neuro/Psych  Headaches, negative psych ROS   GI/Hepatic negative GI ROS, Neg liver ROS,   Endo/Other  negative endocrine ROS  Renal/GU negative Renal ROS     Musculoskeletal  (+) Arthritis ,   Abdominal (+)  Abdomen: soft. Bowel sounds: normal.  Peds  Hematology negative hematology ROS (+)   Anesthesia Other Findings   Reproductive/Obstetrics (+) Pregnancy                             Anesthesia Physical Anesthesia Plan  ASA: II  Anesthesia Plan: Epidural   Post-op Pain Management:    Induction:   PONV Risk Score and Plan: 1  Airway Management Planned: Natural Airway  Additional Equipment: None  Intra-op Plan:   Post-operative Plan:   Informed Consent: I have reviewed the patients History and Physical, chart, labs and discussed the procedure including the risks, benefits and alternatives for the proposed anesthesia with the patient or authorized representative who has indicated his/her understanding and acceptance.     Dental advisory given  Plan Discussed with:   Anesthesia Plan Comments: (Lab Results      Component                Value               Date                      WBC                      8.8                 06/22/2020                HGB                      12.0                06/22/2020                HCT                      36.8                06/22/2020                MCV                      85.6                06/22/2020                PLT                      224                  06/22/2020  Covid-19 Nucleic Acid Test Results Lab Results      Component                Value               Date                      SARSCOV2NAA              POSITIVE (A)        06/01/2020                SARSCOV2NAA              NEGATIVE            04/22/2020          )        Anesthesia Quick Evaluation

## 2020-06-23 NOTE — Discharge Summary (Signed)
Postpartum Discharge Summary    Patient Name: Cassandra Harrison DOB: 07/04/1990 MRN: [id number]  Date of admission: 06/22/2020 Delivery date:06/23/2020  Delivering provider: Arrie Senate  Date of discharge: 06/25/2020  Admitting diagnosis: Post-dates pregnancy [O48.0] Intrauterine pregnancy: [redacted]w[redacted]d    Secondary diagnosis:  Active Problems:   Supervision of other normal pregnancy, antepartum   Rh negative state in antepartum period   Obesity in pregnancy   High risk pregnancy due to smoking in third trimester   Asthma   Morbid obesity (HDeuel   COVID-19 virus infection   Vaginal delivery  Additional problems: none    Discharge diagnosis: Term Pregnancy Delivered                                              Post partum procedures:None Augmentation: Pitocin, Cytotec and IP Foley Complications: RIOE>70hours  Hospital course: Onset of Labor With Vaginal Delivery      30y.o. yo G2P1001 at 30w5das admitted in Latent Labor on 06/22/2020. Patient had an uncomplicated labor course as follows:  Membrane Rupture Time/Date: 3:30 AM ,06/22/2020   Delivery Method:Vaginal, Spontaneous  Episiotomy: None  Lacerations:  None  Patient had an uncomplicated postpartum course.  She is ambulating, tolerating a regular diet, passing flatus, and urinating well. Patient is discharged home in stable condition on 06/25/20.Declined contraception. Discussed options.   Newborn Data: Birth date:06/23/2020  Birth time:8:56 AM  Gender:Female  Living status:Living  Apgars:9 ,9  Weight:3195 g   Magnesium Sulfate received: No BMZ received: No Rhophylac:Yes MMR:No T-DaP:Given prenatally Flu: Yes Transfusion:No  Physical exam  Vitals:   06/24/20 0902 06/24/20 1300 06/24/20 2008 06/25/20 0543  BP: 100/64 107/62 112/76 108/71  Pulse: 68 63 77 (!) 57  Resp: _0 Temp: 98.1 F (36.7 C) 98.1 F (36.7 C) 98.2 F (36.8 C) 97.9 F (36.6 C)  TempSrc:  Axillary Axillary Oral  SpO2: 100%  99% 99% 100%  Weight:      Height:       General: alert, cooperative and no distress Lochia: appropriate Uterine Fundus: firm, U/2 Incision: N/A DVT Evaluation: No evidence of DVT seen on physical exam. Labs: Lab Results  Component Value Date   WBC 8.8 06/22/2020   HGB 12.0 06/22/2020   HCT 36.8 06/22/2020   MCV 85.6 06/22/2020   PLT 224 06/22/2020   CMP Latest Ref Rng & Units 06/01/2020  Glucose 70 - 99 mg/dL 104(H)  BUN 6 - 20 mg/dL <5(L)  Creatinine 0.44 - 1.00 mg/dL 0.50  Sodium 135 - 145 mmol/L 137  Potassium 3.5 - 5.1 mmol/L 3.7  Chloride 98 - 111 mmol/L 107  CO2 22 - 32 mmol/L 22  Calcium 8.9 - 10.3 mg/dL 9.6  Total Protein 6.5 - 8.1 g/dL 5.8(L)  Total Bilirubin 0.3 - 1.2 mg/dL 0.3  Alkaline Phos 38 - 126 U/L 98  AST 15 - 41 U/L 17  ALT 0 - 44 U/L 15   Edinburgh Score: No flowsheet data found.   After visit meds:  Allergies as of 06/25/2020      Reactions   Olive Oil Nausea And Vomiting   She can not have anything with olives in it   Latex Rash   Patient states that she is allergic to latex condoms.  They give her a rash and a yeast infection.  Medication List    STOP taking these medications   albuterol 108 (90 Base) MCG/ACT inhaler Commonly known as: VENTOLIN HFA   aspirin 81 MG chewable tablet   Blood Pressure Kit Devi   Comfort Fit Maternity Supp Sm Misc   ferrous sulfate 325 (65 FE) MG tablet   guaiFENesin 600 MG 12 hr tablet Commonly known as: Mucinex   guaiFENesin-dextromethorphan 100-10 MG/5ML syrup Commonly known as: ROBITUSSIN DM     TAKE these medications   calcium carbonate 750 MG chewable tablet Commonly known as: TUMS EX Chew 1 tablet by mouth daily.   docusate sodium 100 MG capsule Commonly known as: Colace Take 1 capsule (100 mg total) by mouth 2 (two) times daily.   ibuprofen 600 MG tablet Commonly known as: ADVIL Take 1 tablet (600 mg total) by mouth every 6 (six) hours as needed for fever, headache, moderate  pain or cramping.   Prenatal Gummies/DHA & FA 0.4-32.5 MG Chew Chew 3 tablets by mouth daily.        Discharge home in stable condition Infant Feeding: Breast Infant Disposition:home with mother Discharge instruction: per After Visit Summary and Postpartum booklet. Activity: Advance as tolerated. Pelvic rest for 6 weeks.  Diet: routine diet Future Appointments: Future Appointments  Date Time Provider [place]  08/02/2020  9:15 AM Leftwich-Kirby, Kathie Dike, CNM [facility] None   Follow up Visit:  [place] Follow up.   Why: in 4 weeks for postpartum visit or sooner as needed for pregnancy-related problems Contact information: [address] [id number] [place] [facility] Follow up.   Specialty: Obstetrics and Gynecology Why: as needed for pregnancy-related emergencies (severe headache, stomach pain or breast pain, fever greater than 100.4, severe depression, vision changes, difficulty breathing) Contact information: [address] [id number] [place]             Message sent to [facility] 06/23/20.   Please schedule this patient for a In person postpartum visit in 4 weeks with the following provider: Any provider. Additional Postpartum F/U:none  Low risk pregnancy complicated by: n/a Delivery mode:  Vaginal, Spontaneous  Anticipated Birth Control:  Unsure   06/25/2020 Manya Silvas, CNM

## 2020-06-23 NOTE — Lactation Note (Signed)
This note was copied from a baby's chart. Lactation Consultation Note  Patient Name: Cassandra Harrison [id number] Date: 06/23/2020 Reason for consult: Initial assessment;1st time breastfeeding Age:30 hours  Initial visit to 8 hours old infant of a P2 mother. Mother is attempting football latch to right breast upon arrival. Talked to mother about hand expression and demonstrated technique. Colostrum easily expressed, but mother expresses discomfort/sensitivity with expression.  Set up support pillows for football position to right breast. Demonstrated alignment. Several attempts made using a teacup hold. Mother has flat nipples but compressible tissue. Able to latch infant but only a few suckles and no swallowing. Mother has 20 mm nipple shield. LC demonstrated placement and able to latch infant. Noted sucking rhythmically with flanged lips, observed swallowing and breast tissue moving. Mother denies pain or discomfort.   Reviewed with mother average size of a NB stomach. Talked about infant's hunger and fullness cues.  Discussed milk coming to volume. Reviewed newborn behavior and expectations during first days of life.    Plan: 1-Breastfeeding on demand, ensuring a deep, comfortable latch.  2-Offer breast 8-12 times in 24h period to establish good milk supply. 3-Undressing infant and place skin to skin when ready to breastfeed 4-Keep infant awake during breastfeeding session: massaging breast, infant's hand/shoulder/feet 5-Monitor voids and stools as signs good intake.  6-Encouraged maternal rest, hydration and food intake.  7-Contact LC as needed for feeds/support/concerns/questions   All questions answered at this time.    Maternal Data Formula Feeding for Exclusion: No Has patient been taught Hand Expression?: Yes Does the patient have breastfeeding experience prior to this delivery?: No  Feeding Feeding Type: Breast Fed  LATCH Score Latch: Grasps breast easily, tongue down, lips  flanged, rhythmical sucking.  Audible Swallowing: A few with stimulation  Type of Nipple: Flat  Comfort (Breast/Nipple): Soft / non-tender  Hold (Positioning): Assistance needed to correctly position infant at breast and maintain latch.  LATCH Score: 7  Interventions Interventions: Breast feeding basics reviewed;Assisted with latch;Skin to skin;Breast massage;Hand express;Adjust position;Support pillows;Position options;Expressed milk  Lactation Tools Discussed/Used Tools: Nipple Shields Nipple shield size: 20   Consult Status Consult Status: Follow-up Date: 06/24/20 Follow-up type: In-patient    Satoshi Kalas A Higuera Ancidey 06/23/2020, 5:37 PM

## 2020-06-24 MED ORDER — RHO D IMMUNE GLOBULIN 1500 UNIT/2ML IJ SOSY
300.0000 ug | PREFILLED_SYRINGE | Freq: Once | INTRAMUSCULAR | Status: AC
  Administered 2020-06-24: 300 ug via INTRAMUSCULAR
  Filled 2020-06-24: qty 2

## 2020-06-24 NOTE — Lactation Note (Signed)
This note was copied from a baby's chart. Lactation Consultation Note  Patient Name: Girl Suni Jarnagin [id number] Date: 06/24/2020 Reason for consult: Follow-up assessment Age:30 hours  Follow up 36 hours old infant with 3.29% weight loss at the time of visit. Infant is sleeping with a pacifier at time of arrival. Per mother, infant just finished 30 mL of formula via bottle. Mother is  disappointed because she was looking forward to exclusively breastfeed this infant.  Mother explains nipples are sore. LC observed compression vertical stripes in both nipples. Mother is using coconut oil. Encouraged mother to use her own EBM, air-dry and then use comfort gels.  Provided formula volume guidelines according to infant's age. RN proactively showed pacing while bottle-feeding. Reinforced upright position and frequent burping.  LC offered to set up hospital grade DEBP, mother declines. Mother has a hand pump and personal pump in room. FOI helped with set up. Discussed pumping prior to feeding in order to have EBM to offer before formula supplementation.  Promoted self-care as part of mother's healing process. LC encouraged parents to call for assistance with pumping or any other needs.   Feeding plan:  1. Feed infant 8-12 time in 24h or following hunger cues.  2. EBM or formula feed following volume guidelines, paced bottle feeding and fullness cues.   3. Pump or hand-express everytime infant is supplemented with formula  4. Monitor voids and stools as signs good intake 5. EBM to nipples, air-dry and comfort gels  6. Encouraged maternal rest, hydration and food intake.  7. Contact Lactation Services or local resources for support, questions or concerns.     All questions answered at this time.    Maternal Data Formula Feeding for Exclusion: No Has patient been taught Hand Expression?: Yes  Feeding Feeding Type: Formula Nipple Type: Slow - flow  Interventions Interventions: Breast feeding  basics reviewed;Expressed milk;Hand express;Breast massage;Skin to skin;Hand pump;Pre-pump if needed  Lactation Tools Discussed/Used Pump Education: Milk Storage;Setup, frequency, and cleaning  Consult Status Consult Status: Follow-up Date: 06/25/20 Follow-up type: In-patient    Emmalee Solivan A Higuera Ancidey 06/24/2020, 9:25 PM

## 2020-06-24 NOTE — Progress Notes (Addendum)
Patient ID: Cassandra Harrison, female   DOB: 08/16/1990, 30 y.o.   MRN: [id number]  Post Partum Day 1 Subjective:  Patient denies complaints. She has been up and walking around well. She reports eating and drinking w/o N/V. She has been urinating and passing flatus; has not passed stool yet. She denies discharge but has had to change a pad every 3-4 hours for vaginal bleeding. The pads are not soaked when changing them. She does not want any form of contraception. She says "I went 8 years until this one, I'm not worried. We will not be conceiving anytime soon." She does not want any more children. Baby will have to stay another night because of prolonged rupture of membranes. She reports she has been breastfeeding and initially it was not going well but lactation saw her and she felt better after that.   Objective: Blood pressure (!) 95/55, pulse 80, temperature 98.1 F (36.7 C), temperature source Oral, resp. rate 18, height 5' 2" (1.575 m), weight 108 kg, last menstrual period 09/21/2019, SpO2 100 %, unknown if currently breastfeeding.  Physical Exam:  General: alert, cooperative, no distress and morbidly obese Lochia: appropriate Uterine Fundus: firm DVT Evaluation: No evidence of DVT seen on physical exam. Negative Homan's sign. No cords or calf tenderness. No significant calf/ankle edema.  Recent Labs    06/22/20 1146  HGB 12.0  HCT 36.8    Assessment/Plan: Plan for discharge tomorrow. Breastfeeding baby; lactation consulted Contraception declined. Baby staying overnight due to prolonged rupture of membranes.   LOS: 2 days   Lowella Curb 06/24/2020, 8:32 AM   I personally saw and evaluated the patient, performing the key elements of the service. I developed and verified the management plan that is described in the resident's/student's note, and I agree with the content with my edits above. VSS, HRR&R, Resp unlabored, Legs neg.  Nigel Berthold, CNM 06/28/2020 8:43  AM

## 2020-06-25 ENCOUNTER — Inpatient Hospital Stay (HOSPITAL_COMMUNITY)
Admission: AD | Admit: 2020-06-25 | Payer: Medicaid Other | Source: Home / Self Care | Admitting: Obstetrics & Gynecology

## 2020-06-25 ENCOUNTER — Inpatient Hospital Stay (HOSPITAL_COMMUNITY): Payer: Medicaid Other

## 2020-06-25 LAB — RH IG WORKUP (INCLUDES ABO/RH)
ABO/RH(D): B NEG
Fetal Screen: NEGATIVE
Gestational Age(Wks): 40.5
Unit division: 0

## 2020-06-25 MED ORDER — IBUPROFEN 600 MG PO TABS: 600.0000 mg | ORAL_TABLET | Freq: Four times a day (QID) | ORAL | 1 refills | Status: DC | PRN

## 2020-06-25 NOTE — Lactation Note (Signed)
This note was copied from a baby's chart. Lactation Consultation Note  Patient Name: Cassandra Harrison POEUM'P Date: 06/25/2020 Reason for consult: Term;Infant weight loss;Other (Comment);Follow-up assessment;Nipple pain/trauma (4 % weight loss,) Age:30 hours  Per mom nipples have been to sore to latch.  LC offered to reassess breast tissue, mom receptive, and noted the left nipple to have a intact  positional strip, and right nipple pink , dry and abrasion noted.  Per mom doesn't like the comfort gels and is using coconut oil.  LC provided breast shells for between feedings while awake.  LC recommended to allow the sore nipples to clear up completely and then use coconut oil.  Sore nipple and engorgement prevention and tx.  Mom has the hand pump and a DEBP at home.  Mom mentioned dad had gone out and bought #20 NS.  LC recommended after her nipples are healed, and the areolas are more compressible to have a #24 NS available.  LC offered to request and Lc O/P appt and mom receptive .  LC placed a request and mom aware she will receive a F/U call to schedule.   Maternal Data Has patient been taught Hand Expression?: Yes  Feeding Feeding Type:  (RN Bryson Dames aware to update the Doc flow sheets for feedings , voids and stools.)  LATCH Score                   Interventions Interventions: Breast feeding basics reviewed;Shells;Hand pump  Lactation Tools Discussed/Used Tools: Shells;Pump;Flanges;Coconut oil Nipple shield size: 20 Flange Size: 24 Shell Type: Inverted Breast pump type: Manual Pump Education: Milk Storage   Consult Status Consult Status: Complete Date: 06/25/20 Follow-up type: Out-patient (LC offered to request and LC O/P appt and mom receptive. LC placed a request in the Epic basket. Mom aware she will receive a phone call from the clinic.)    Kathrin Greathouse 06/25/2020, 12:14 PM

## 2020-06-25 NOTE — Discharge Instructions (Signed)
Postpartum Care After Vaginal Delivery The following information offers guidance about how to care for yourself from the time you deliver your baby to 6-12 weeks after delivery (postpartum period). If you have problems or questions, contact your health care provider for more specific instructions. Follow these instructions at home: Vaginal bleeding  It is normal to have vaginal bleeding (lochia) after delivery. Wear a sanitary pad for bleeding and discharge. ? During the first week after delivery, the amount and appearance of lochia is often similar to a menstrual period. ? Over the next few weeks, it will gradually decrease to a dry, yellow-brown discharge. ? For most women, lochia stops completely by 4-6 weeks after delivery, but can vary.  Change your sanitary pads frequently. Watch for any changes in your flow, such as: ? A sudden increase in volume. ? A change in color. ? Large blood clots.  If you pass a blood clot from your vagina, save it and call your health care provider. Do not flush blood clots down the toilet before talking with your health care provider.  Do not use tampons or douches until your health care provider approves.  If you are not breastfeeding, your period should return 6-8 weeks after delivery. If you are feeding your baby breast milk only, your period may not return until you stop breastfeeding. Perineal care  Keep the area between the vagina and the anus (perineum) clean and dry. Use medicated pads and pain-relieving sprays and creams as directed.  If you had a surgical cut in the perineum (episiotomy) or a tear, check the area for signs of infection until you are healed. Check for: ? More redness, swelling, or pain. ? Fluid or blood coming from the cut or tear. ? Warmth. ? Pus or a bad smell.  You may be given a squirt bottle to use instead of wiping to clean the perineum area after you use the bathroom. Pat the area gently to dry it.  To relieve pain  caused by an episiotomy, a tear, or swollen veins in the anus (hemorrhoids), take a warm sitz bath 2-3 times a day. In a sitz bath, the warm water should only come up to your hips and cover your buttocks.   Breast care  In the first few days after delivery, your breasts may feel heavy, full, and uncomfortable (breast engorgement). Milk may also leak from your breasts. Ask your health care provider about ways to help relieve the discomfort.  If you are breastfeeding: ? Wear a bra that supports your breasts and fits well. Use breast pads to absorb milk that leaks. ? Keep your nipples clean and dry. Apply creams and ointments as told. ? You may have uterine contractions every time you breastfeed for up to several weeks after delivery. This helps your uterus return to its normal size. ? If you have any problems with breastfeeding, notify your health care provider or lactation consultant.  If you are not breastfeeding: ? Avoid touching your breasts. Do not squeeze out (express) milk. Doing this can make your breasts produce more milk. ? Wear a good-fitting bra and use cold packs to help with swelling. Intimacy and sexuality  Ask your health care provider when you can engage in sexual activity. This may depend upon: ? Your risk of infection. ? How fast you are healing. ? Your comfort and desire to engage in sexual activity.  You are able to get pregnant after delivery, even if you have not had your period. Talk with   your health care provider about methods of birth control (contraception) or family planning if you desire future pregnancies. Medicines  Take over-the-counter and prescription medicines only as told by your health care provider.  Take an over-the-counter stool softener to help ease bowel movements as told by your health care provider.  If you were prescribed an antibiotic medicine, take it as told by your health care provider. Do not stop taking the antibiotic even if you start to  feel better.  Review all previous and current prescriptions to check for possible transfer into breast milk. Activity  Gradually return to your normal activities as told by your health care provider.  Rest as much as possible. Nap while your baby is sleeping. Eating and drinking  Drink enough fluid to keep your urine pale yellow.  To help prevent or relieve constipation, eat high-fiber foods every day.  Choose healthy eating to support breastfeeding or weight loss goals.  Take your prenatal vitamins until your health care provider tells you to stop.   General tips/recommendations  Do not use any products that contain nicotine or tobacco. These products include cigarettes, chewing tobacco, and vaping devices, such as e-cigarettes. If you need help quitting, ask your health care provider.  Do not drink alcohol, especially if you are breastfeeding.  Do not take medications or drugs that are not prescribed to you, especially if you are breastfeeding.  Visit your health care provider for a postpartum checkup within the first 3-6 weeks after delivery.  Complete a comprehensive postpartum visit no later than 12 weeks after delivery.  Keep all follow-up visits for you and your baby. Contact a health care provider if:  You feel unusually sad or worried.  Your breasts become red, painful, or hard.  You have a fever or other signs of an infection.  You have bleeding that is soaking through one pad an hour or you have blood clots.  You have a severe headache that doesn't go away or you have vision changes.  You have nausea and vomiting and are unable to eat or drink anything for 24 hours. Get help right away if:  You have chest pain or difficulty breathing.  You have sudden, severe leg pain.  You faint or have a seizure.  You have thoughts about hurting yourself or your baby. If you ever feel like you may hurt yourself or others, or have thoughts about taking your own life,  get help right away. Go to your nearest emergency department or:  Call your local emergency services (911 in the [place]  The National Suicide Prevention Lifeline at (850)404-9233. This suicide crisis helpline is open 24 hours a day.  Text the Crisis Text Line at 631-362-0337 (in the [place] Summary  The period of time after you deliver your newborn up to 6-12 weeks after delivery is called the postpartum period.  Keep all follow-up visits for you and your baby.  Review all previous and current prescriptions to check for possible transfer into breast milk.  Contact a health care provider if you feel unusually sad or worried during the postpartum period. This information is not intended to replace advice given to you by your health care provider. Make sure you discuss any questions you have with your health care provider. Document Revised: 02/11/2020 Document Reviewed: 02/11/2020 Elsevier Patient Education  2021 Else[employer] Inc.   Breastfeeding and Smoking It is not safe to smoke when you are breastfeeding. Smoking during breastfeeding is harmful to you and to your baby in  many ways. When you smoke during breastfeeding, nicotine and other harmful substances pass through your milk to your baby. When you smoke, your baby is also exposed to cigarette smoke (secondhand smoke) and to surfaces contaminated with the harmful substances in cigarette smoke (thirdhand smoke). The dangers of smoking during breastfeeding apply to using e-cigarettes as well. These also expose your baby to nicotine and other toxic substances. If you are smoking and breastfeeding, do not stop breastfeeding. Breast milk is still the best food for your baby. Take steps to quit smoking. How does smoking affect me? Effects on your health The effects of smoking are well known. When you smoke, you increase your risk of:  Early death.  Cancer.  Heart disease.  Lung disease.  Stroke.  Eye disease.  Diabetes.  Rheumatoid  arthritis.  Osteoporosis. Effects on breastfeeding Smoking also affects your ability to breastfeed successfully. Smoking lowers a chemical messenger in your body that is important for stimulating the production of breast milk. This chemical is a hormone called prolactin. Smoking during breastfeeding may lead to:  Having trouble breastfeeding.  Stopping breastfeeding early.  Producing breast milk that smells and tastes like a cigarette.  Producing breast milk that has a lower fat and calorie content. How does smoking affect my baby? Direct exposure When you smoke, nicotine and other toxic substances pass through your breast milk directly to your baby. This can affect the development of your baby's brain and body. It may also cause:  Colic.  Respiratory infections.  Ear infections.  Sudden infant death syndrome (SIDS).  Restlessness.  Increased heart rate.  Difficulty growing. Secondhand smoke When you smoke or someone else smokes around your baby, your baby is also exposed to secondhand smoke. The effects of secondhand smoke on babies can include:  SIDS.  Ear infections.  Frequent colds.  Pneumonia.  Bronchitis.  Poor lung development.  Frequent asthma attacks, if your child has asthma. Thirdhand smoke When you or other people smoke in your home, toxic substances from smoke can gradually build up in your hair and clothing as well as in fabric on furniture and drapes. Your baby can be exposed to these substances after touching contaminated surfaces (thirdhand smoke exposure). This may be especially risky for babies because babies put their fingers into their mouth often. A baby's developing brain is also more sensitive to toxins. Breastfeeding tips and recommendations If you are still smoking:  Do not stop breastfeeding.  Cut back the amount you smoke and continue trying to stop smoking.  Smoke outside.  Smoke after you breastfeed your baby, not before.  Wash  your hands and change your clothes before breastfeeding.  Do not smoke in your car. Follow these instructions at home:  Take over-the-counter and prescription medicines only as told by your health care provider.  Do not smoke in your house. Do not let anyone smoke in your house.  Keep all follow-up visits. This is important.   Where to find more information  For information on how to quit smoking, go to the smokefree.gov website.  For more information on how smoking affects breastfeeding, turn to the Centers for Disease Control and Prevention: [employer].cy Contact a health care provider if:  You are smoking while breastfeeding.  You are struggling to stop smoking.  You are having trouble breastfeeding.  Your baby is restless and has trouble sleeping.  Your baby has a fever, cough, or congestion.  Your baby is not gaining weight. Get help right away if:  Your  baby is having trouble breathing. These symptoms may represent a serious problem that is an emergency. Do not wait to see if the symptoms will go away. Get medical help right away. Call your local emergency services (911 in the [place] Do not drive yourself to the hospital. Summary  Smoking when you are breastfeeding is dangerous for you and for your baby.  Your baby may be affected by nicotine and toxic substances in your breast milk, secondhand smoke, and thirdhand smoke.  Ask your health care provider for help if you are struggling to stop smoking.  Do not stop breastfeeding. Stop smoking. This information is not intended to replace advice given to you by your health care provider. Make sure you discuss any questions you have with your health care provider. Document Revised: 09/30/2019 Document Reviewed: 09/30/2019 Elsevier Patient Education  2021 [employer].

## 2020-06-25 NOTE — Progress Notes (Signed)
Post Partum Day 2 following NSVD Subjective: no complaints, up ad lib, voiding, tolerating PO and + flatus  Objective: Blood pressure 108/71, pulse (!) 57, temperature 97.9 F (36.6 C), temperature source Oral, resp. rate 16, height 5\' 2"  (1.575 m), weight 108 kg, last menstrual period 09/21/2019, SpO2 100 %, unknown if currently breastfeeding.  Physical Exam:  General: alert, cooperative, no distress and morbidly obese Lochia: appropriate Uterine Fundus: firm DVT Evaluation: No evidence of DVT seen on physical exam. No cords or calf tenderness. No significant calf/ankle edema.  Recent Labs    06/22/20 1146  HGB 12.0  HCT 36.8    Assessment/Plan: Discharge home, Breastfeeding and Contraception none. Patient would not like to start any birth control method at this time.    LOS: 3 days   08/20/20, PA-S 06/25/2020, 7:41 AM

## 2020-08-02 ENCOUNTER — Ambulatory Visit (INDEPENDENT_AMBULATORY_CARE_PROVIDER_SITE_OTHER): Payer: Medicaid Other | Admitting: Advanced Practice Midwife

## 2020-08-02 ENCOUNTER — Other Ambulatory Visit: Payer: Self-pay

## 2020-08-02 DIAGNOSIS — N393 Stress incontinence (female) (male): Secondary | ICD-10-CM

## 2020-08-02 DIAGNOSIS — Z720 Tobacco use: Secondary | ICD-10-CM | POA: Insufficient documentation

## 2020-08-02 DIAGNOSIS — O99330 Smoking (tobacco) complicating pregnancy, unspecified trimester: Secondary | ICD-10-CM | POA: Insufficient documentation

## 2020-08-02 DIAGNOSIS — Z87891 Personal history of nicotine dependence: Secondary | ICD-10-CM

## 2020-08-02 NOTE — Patient Instructions (Signed)
Your Home Program  General Guidelines for Pelvic Floor Exercise  Challenge your muscles to do more than they are used to doing. The quality of the exercise is more important that the number you perform.  Avoid straining, holding your breath or using buttock or leg muscles while you exercise the pelvic floor muscles.  Count out loud and continue breathing to avoid straining.  Relax your body and breathe during your exercises.  Coordinate your breathing with your pelvic floor contraction by blowing out or exhaling while you contract your pelvic floor muscles.   Concentrate on activating both the sphincters and levator ani muscles of the pelvic floor with each exercise.  Position for the Exercises  Start lying down with your knees bent and supported with pillows.  Once you've gained awareness and can feel the contractions you may perform the exercises either sitting or standing.  For example, you can do them while driving, working on the computer, or waiting in lines.  Quick Contractions  Repeat this exercise 10 times.  Do the exercise 2-3 times per day.  Rapidly contract your pelvic floor muscles and hold for 2 seconds relax for 2 seconds.  Try to do the contraction on breathing exhalation.  Endurance Contractions  Repeat this 10 times.  Do the exercise 2-3 times per day.  Pull your pelvic floor muscles up and in and hold for 10 seconds then relax for 10 seconds.  Count out loud while you are holding the contraction to make sure that you are breathing throughout the exercise and not straining.  Other Exercises/Instructions    2007, [employer] Doc.37

## 2020-08-02 NOTE — Progress Notes (Signed)
Post Partum Visit Note  Cassandra Harrison is a 30 y.o. [id number] female who presents for a postpartum visit. She is 5 weeks postpartum following a normal spontaneous vaginal delivery.  I have fully reviewed the prenatal and intrapartum course. The delivery was at 40.5 gestational weeks.  Anesthesia: epidural. Postpartum course has been normal. Baby is doing well. Baby is feeding by breastmilk, exclusively pumping. Bleeding lochia stopped but onset of light bleeding/menses today. Bowel function is normal. Bladder function is improved since delivery but some stress incontinence x 8 years, back to baseline now. Patient is sexually active. Contraception method is none.   Postpartum depression screening: negative, score 2.  Pt states she does have crying episodes occasionally.    The pregnancy intention screening data noted above was reviewed. Potential methods of contraception were discussed. The patient elected to proceed with No Method - Other Reason.    Edinburgh Postnatal Depression Scale - 08/02/20 0933      Edinburgh Postnatal Depression Scale:  In the Past 7 Days   I have been able to laugh and see the funny side of things. 0    I have looked forward with enjoyment to things. 0    I have blamed myself unnecessarily when things went wrong. 0    I have been anxious or worried for no good reason. 0    I have felt scared or panicky for no good reason. 0    Things have been getting on top of me. 1    I have been so unhappy that I have had difficulty sleeping. 0    I have felt sad or miserable. 0    I have been so unhappy that I have been crying. 1    The thought of harming myself has occurred to me. 0    Edinburgh Postnatal Depression Scale Total 2            The following portions of the patient's history were reviewed and updated as appropriate: allergies, current medications, past family history, past medical history, past social history, past surgical history and problem  list.  Review of Systems Pertinent items noted in HPI and remainder of comprehensive ROS otherwise negative.    Objective:  BP 110/64   Pulse (!) 59   Wt 230 lb (104.3 kg)   LMP 09/21/2019 (Approximate)   BMI 42.07 kg/m    VS reviewed, nursing note reviewed,  Constitutional: well developed, well nourished, no distress HEENT: normocephalic CV: normal rate Pulm/chest wall: normal effort Abdomen: soft Neuro: alert and oriented x 3 Skin: warm, dry Psych: affect normal   Assessment:    Normal postpartum exam. Pap smear not done at today's visit.   Plan:   Essential components of care per ACOG recommendations:  1.  Mood and well being: Patient with negative depression screening today. Reviewed local resources for support.  - Patient does use tobacco. If using tobacco we discussed reduction and for recently cessation risk of relapse - hx of drug use? No    2. Infant care and feeding:  -Patient currently breastmilk feeding? Yes  --If breastmilk feeding discussed return to work and pumping. If needed, patient was provided letter for work to allow for every 2-3 hr pumping breaks, and to be granted a private location to express breastmilk and refrigerated area to store breastmilk. Reviewed importance of draining breast regularly to support lactation. -Social determinants of health (SDOH) reviewed in EPIC. No concerns   3. Sexuality, contraception and birth  spacing - Patient does not want to prevent pregnancy and would welcome any pregnancies that occur.  - Reviewed forms of contraception in tiered fashion. Patient desired no method today.     4. Sleep and fatigue -Encouraged family/partner/community support of 4 hrs of uninterrupted sleep to help with mood and fatigue  5. Physical Recovery  - Discussed patients delivery without complications - Patient had no laceration, perineal healing reviewed. Patient expressed understanding - Patient has urinary incontinence? Yes , with  cough/sneeze, has had since her first pregnancy with her 30 year old.  Discussed PT. Pt declines for now, but f/u visit in 3 months to consider referral to PT.  - Patient is safe to resume physical and sexual activity  6.  Health Maintenance - Last pap smear done 12/24/2019 and was normal with negative HPV.    Sharen Counter, CNM [facility] [employer], [facility]

## 2021-05-24 ENCOUNTER — Emergency Department (HOSPITAL_COMMUNITY)
Admission: EM | Admit: 2021-05-24 | Discharge: 2021-05-24 | Disposition: A | Discharge status: Home / Self Care | Payer: Medicaid Other | Attending: Emergency Medicine | Admitting: Emergency Medicine

## 2021-05-24 ENCOUNTER — Encounter (HOSPITAL_COMMUNITY): Payer: Self-pay | Admitting: Oncology

## 2021-05-24 ENCOUNTER — Other Ambulatory Visit: Payer: Self-pay

## 2021-05-24 DIAGNOSIS — J069 Acute upper respiratory infection, unspecified: Secondary | ICD-10-CM

## 2021-05-24 DIAGNOSIS — Z91040 Latex allergy status: Secondary | ICD-10-CM | POA: Diagnosis not present

## 2021-05-24 DIAGNOSIS — Z20822 Contact with and (suspected) exposure to covid-19: Secondary | ICD-10-CM | POA: Insufficient documentation

## 2021-05-24 DIAGNOSIS — Z8616 Personal history of COVID-19: Secondary | ICD-10-CM | POA: Insufficient documentation

## 2021-05-24 DIAGNOSIS — Z7951 Long term (current) use of inhaled steroids: Secondary | ICD-10-CM | POA: Insufficient documentation

## 2021-05-24 DIAGNOSIS — F17210 Nicotine dependence, cigarettes, uncomplicated: Secondary | ICD-10-CM | POA: Insufficient documentation

## 2021-05-24 DIAGNOSIS — J4521 Mild intermittent asthma with (acute) exacerbation: Secondary | ICD-10-CM

## 2021-05-24 DIAGNOSIS — R519 Headache, unspecified: Secondary | ICD-10-CM | POA: Diagnosis present

## 2021-05-24 LAB — RESP PANEL BY RT-PCR (FLU A&B, COVID) ARPGX2
Influenza A by PCR: NEGATIVE
Influenza B by PCR: NEGATIVE
SARS Coronavirus 2 by RT PCR: NEGATIVE

## 2021-05-24 MED ORDER — IPRATROPIUM-ALBUTEROL 0.5-2.5 (3) MG/3ML IN SOLN
3.0000 mL | Freq: Once | RESPIRATORY_TRACT | Status: AC
  Administered 2021-05-24: 19:00:00 3 mL via RESPIRATORY_TRACT
  Filled 2021-05-24: qty 3

## 2021-05-24 MED ORDER — ALBUTEROL SULFATE HFA 108 (90 BASE) MCG/ACT IN AERS: 1.0000 | INHALATION_SPRAY | Freq: Four times a day (QID) | RESPIRATORY_TRACT | 0 refills | Status: DC | PRN

## 2021-05-24 MED ORDER — DEXAMETHASONE SODIUM PHOSPHATE 10 MG/ML IJ SOLN
10.0000 mg | Freq: Once | INTRAMUSCULAR | Status: AC
  Administered 2021-05-24: 20:00:00 10 mg via INTRAMUSCULAR
  Filled 2021-05-24: qty 1

## 2021-05-24 NOTE — ED Provider Notes (Signed)
Emergency Medicine Provider Triage Evaluation Note  Cassandra Harrison , a 30 y.o. female  was evaluated in triage.  Pt complains of body aches, headache, wheezing, congestion, ear pain, diarrhea for 3 days. Denies recent sick contacts. Negative home covid test yesterday. Hx of asthma, does not have inhaler at home.  Review of Systems  Positive: As above Negative: Chest pain, abdominal pain  Physical Exam  BP 118/77 (BP Location: Left Arm)    Pulse 83    Temp (!) 97.5 F (36.4 C) (Oral)    Resp 18    Ht 5' 2.25" (1.581 m)    Wt 90.7 kg    LMP 05/19/2021 (Approximate)    SpO2 98%    BMI 36.29 kg/m  Gen:   Awake, no distress   Resp:  Normal effort, diffuse biphasic wheezing without accessory muscle use MSK:   Moves extremities without difficulty  Other:    Medical Decision Making  Medically screening exam initiated at 6:59 PM.  Appropriate orders placed.  Cassandra Harrison was informed that the remainder of the evaluation will be completed by another provider, this initial triage assessment does not replace that evaluation, and the importance of remaining in the ED until their evaluation is complete.  URI, wheezing   [place] 05/24/21 1901    Cathren Laine, MD 05/25/21 [phone]

## 2021-05-24 NOTE — ED Triage Notes (Signed)
Pt c/o wheezing, congestion HA, diarrhea and ear pain that began 3 days ago.  Pt is concerned about the mold in her apartment being the cause of sx.

## 2021-05-24 NOTE — ED Provider Notes (Signed)
[facility]-EMERGENCY DEPT Provider Note   CSN: [id number] Arrival date & time: 05/24/21  1826     History Chief Complaint  Patient presents with   Flu Like Sx    Cassandra Harrison is a 30 y.o. female with a past medical history significant for asthma, tobacco use who presents with 3 days of body aches, headache, wheezing, congestion, ear pain, diarrhea.  Patient denies recent sick contacts.  Patient denies chest pain, but does admit some intermittent shortness of breath, wheezing.  Patient denies abdominal pain, nausea, vomiting.  Patient reports that she does not have inhaler at home, has had asthma exacerbation in the past requiring breathing treatment.  Patient describes her ear pain as dull, denies hearing loss.  Patient reports her cough is dry in nature.  Patient denies any pain at this time.  Patient denies any blood in her stool, dysuria, hematuria.  HPI     Past Medical History:  Diagnosis Date   Asthma    last used 11/29/11   Bulging disc    Chronic back pain    Complication of anesthesia    itching with epidural   Constipation    COVID-19 virus infection 05/2020   Migraine    Morbid obesity (HCC)     Patient Active Problem List   Diagnosis Date Noted   Smoker within last 12 months 08/02/2020   Female stress incontinence 08/02/2020   Asthma    Morbid obesity (HCC)    COVID-19 virus infection 05/2020    Past Surgical History:  Procedure Laterality Date   WISDOM TOOTH EXTRACTION       OB History     Gravida  2   Para  2   Term  2   Preterm  0   AB  0   Living  2      SAB  0   IAB  0   Ectopic  0   Multiple  0   Live Births  2           Family History  Problem Relation Age of Onset   Diabetes Mother    COPD Mother    Heart disease Mother    Cancer Mother        throat cancer   COPD Father    Heart disease Father    Cancer Sister    Cancer Paternal Aunt    Diabetes Maternal Grandmother    Cancer  Maternal Grandmother    COPD Paternal Grandmother    Heart disease Paternal Grandfather    Other Neg Hx     Social History   Tobacco Use   Smoking status: Every Day    Packs/day: 0.50    Types: Cigarettes   Smokeless tobacco: Never  Vaping Use   Vaping Use: Never used  Substance Use Topics   Alcohol use: Not Currently    Comment: occ   Drug use: No    Home Medications Prior to Admission medications   Medication Sig Start Date End Date Taking? Authorizing Provider  albuterol (VENTOLIN HFA) 108 (90 Base) MCG/ACT inhaler Inhale 1-2 puffs into the lungs every 6 (six) hours as needed for wheezing or shortness of breath. 05/24/21  Yes Makenze Ellett H, PA-C  calcium carbonate (TUMS EX) 750 MG chewable tablet Chew 1 tablet by mouth daily. Patient not taking: No sig reported    [provider]  docusate sodium (COLACE) 100 MG capsule Take 1 capsule (100 mg total) by mouth  2 (two) times daily. 12/24/19   Shelly Bombard, MD  ibuprofen (ADVIL) 600 MG tablet Take 1 tablet (600 mg total) by mouth every 6 (six) hours as needed for fever, headache, moderate pain or cramping. 06/25/20   Tamala Julian, [place], [place]  Prenatal MV-Min-FA-Omega-3 (PRENATAL GUMMIES/DHA & FA) 0.4-32.5 MG CHEW Chew 3 tablets by mouth daily. 12/24/19   Shelly Bombard, MD    Allergies    Olive oil and Latex  Review of Systems   Review of Systems  HENT:  Positive for ear pain.   Respiratory:  Positive for cough, shortness of breath and wheezing.   All other systems reviewed and are negative.  Physical Exam Updated Vital Signs BP 100/82    Pulse 74    Temp 98.5 F (36.9 C) (Oral)    Resp 16    Ht 5' 2.25" (1.581 m)    Wt 90.7 kg    LMP 05/19/2021 (Approximate)    SpO2 98%    BMI 36.29 kg/m   Physical Exam Vitals and nursing note reviewed.  Constitutional:      General: She is not in acute distress.    Appearance: Normal appearance.  HENT:     Head: Normocephalic and atraumatic.     Right Ear:  Tympanic membrane and ear canal normal. There is no impacted cerumen.     Left Ear: Tympanic membrane and ear canal normal. There is no impacted cerumen.     Mouth/Throat:     Mouth: Mucous membranes are moist.  Eyes:     General:        Right eye: No discharge.        Left eye: No discharge.  Cardiovascular:     Rate and Rhythm: Normal rate and regular rhythm.     Heart sounds: No murmur heard.   No friction rub. No gallop.  Pulmonary:     Effort: Pulmonary effort is normal.     Breath sounds: Normal breath sounds.     Comments: Patient has diffuse biphasic wheezing in both lung fields without any evidence of focal consolidation.  No respiratory distress, no stridor, no rhonchi no rales. Abdominal:     General: Bowel sounds are normal.     Palpations: Abdomen is soft.  Skin:    General: Skin is warm and dry.     Capillary Refill: Capillary refill takes less than 2 seconds.  Neurological:     Mental Status: She is alert and oriented to person, place, and time.  Psychiatric:        Mood and Affect: Mood normal.        Behavior: Behavior normal.    ED Results / Procedures / Treatments   Labs (all labs ordered are listed, but only abnormal results are displayed) Labs Reviewed  RESP PANEL BY RT-PCR (FLU A&B, COVID) ARPGX2    EKG None  Radiology No results found.  Procedures Procedures   Medications Ordered in ED Medications  ipratropium-albuterol (DUONEB) 0.5-2.5 (3) MG/3ML nebulizer solution 3 mL (3 mLs Nebulization Given 05/24/21 1923)  dexamethasone (DECADRON) injection 10 mg (10 mg Intramuscular Given 05/24/21 2007)    ED Course  I have reviewed the triage vital signs and the nursing notes.  Pertinent labs & imaging results that were available during my care of the patient were reviewed by me and considered in my medical decision making (see chart for details).    MDM Rules/Calculators/A&P  Overall well-appearing patient with stable  vital signs, and diffuse biphasic wheezing who presents with 3 days of cough, congestion, diarrhea, ear pain.  Patient with signs and symptoms of a respiratory infection.  We will perform respiratory virus panel, to assess for COVID, flu.  We will administer DuoNeb for patient's wheezing.  With stable vital signs, and no focal consolidation and minimal clinical concern for pneumonia at this time.  On reevaluation patient's wheezing is minimally improved, she reports some continued wheezing but overall improvement in her breathing.  Given wheezing that is refractory to breathing treatment x1, history of asthma this is an asthma exacerbation.  We will administer IM Decadron at this time.  We will refill patient's asthma inhaler.  Encouraged follow-up with her primary care for reassessment of symptoms, reevaluation of asthma at this time.  Extensive return precautions given including worsening shortness of breath, chest pain.  Patient discharged in stable condition at this time. Final Clinical Impression(s) / ED Diagnoses Final diagnoses:  Viral upper respiratory tract infection  Mild intermittent asthma with exacerbation    Rx / DC Orders ED Discharge Orders          Ordered    albuterol (VENTOLIN HFA) 108 (90 Base) MCG/ACT inhaler  Every 6 hours PRN        05/24/21 2002             Dorien Chihuahua 05/24/21 2057    Lajean Saver, MD 05/25/21 [phone]

## 2021-05-24 NOTE — Discharge Instructions (Addendum)
As we discussed your vital signs are stable today, your symptoms are consistent with an upper respiratory infection.  As you have been history of asthma, and if had wheezing I do recommend you use an inhaler as needed.  I recommend that you follow-up with your primary care doctor to discuss whether you need to escalate your asthma medication.  The side effects from the steroid that we gave you may include increased appetite, swelling of your lower extremities, and insomnia.

## 2021-06-09 ENCOUNTER — Emergency Department (HOSPITAL_COMMUNITY)
Admission: EM | Admit: 2021-06-09 | Discharge: 2021-06-10 | Disposition: A | Discharge status: Home / Self Care | Payer: Medicaid Other | Attending: Emergency Medicine | Admitting: Emergency Medicine

## 2021-06-09 ENCOUNTER — Other Ambulatory Visit: Payer: Self-pay

## 2021-06-09 ENCOUNTER — Encounter (HOSPITAL_COMMUNITY): Payer: Self-pay | Admitting: *Deleted

## 2021-06-09 ENCOUNTER — Emergency Department (HOSPITAL_COMMUNITY): Payer: Medicaid Other

## 2021-06-09 DIAGNOSIS — Z8616 Personal history of COVID-19: Secondary | ICD-10-CM | POA: Diagnosis not present

## 2021-06-09 DIAGNOSIS — H6690 Otitis media, unspecified, unspecified ear: Secondary | ICD-10-CM

## 2021-06-09 DIAGNOSIS — Z20822 Contact with and (suspected) exposure to covid-19: Secondary | ICD-10-CM | POA: Insufficient documentation

## 2021-06-09 DIAGNOSIS — H6693 Otitis media, unspecified, bilateral: Secondary | ICD-10-CM | POA: Diagnosis not present

## 2021-06-09 DIAGNOSIS — R059 Cough, unspecified: Secondary | ICD-10-CM | POA: Diagnosis not present

## 2021-06-09 DIAGNOSIS — F17210 Nicotine dependence, cigarettes, uncomplicated: Secondary | ICD-10-CM | POA: Insufficient documentation

## 2021-06-09 DIAGNOSIS — J45909 Unspecified asthma, uncomplicated: Secondary | ICD-10-CM | POA: Diagnosis not present

## 2021-06-09 DIAGNOSIS — H9203 Otalgia, bilateral: Secondary | ICD-10-CM | POA: Diagnosis present

## 2021-06-09 LAB — RESP PANEL BY RT-PCR (FLU A&B, COVID) ARPGX2
Influenza A by PCR: NEGATIVE
Influenza B by PCR: NEGATIVE
SARS Coronavirus 2 by RT PCR: NEGATIVE

## 2021-06-09 LAB — GROUP A STREP BY PCR: Group A Strep by PCR: NOT DETECTED

## 2021-06-09 NOTE — ED Provider Notes (Signed)
Emergency Medicine Provider Triage Evaluation Note  Cassandra Harrison , a 30 y.o. female  was evaluated in triage.  Pt complains of sore throat and cough and earache.  Was diagnosed with an upper restaurant infection earlier this month at [facility].  Ear pain is severe.  Reports that she had hearing loss.  No fever or chills.  Review of Systems  Positive:  Negative: See above   Physical Exam  BP 116/69 (BP Location: Right Arm)    Pulse 87    Temp 98.9 F (37.2 C) (Oral)    Resp 18    Ht 5\' 2"  (1.575 m)    Wt 90.7 kg    LMP 05/19/2021 (Approximate)    SpO2 97%    BMI 36.57 kg/m  Gen:   Awake, no distress   Resp:  Normal effort  MSK:   Moves extremities without difficulty  Other:  Difficult to visualize right TM secondary to cerumen impaction.  No pharyngeal edema.  No tonsillar exudate.  Uvula is midline.  Talking in complete sentences.  Medical Decision Making  Medically screening exam initiated at 6:32 PM.  Appropriate orders placed.  Cassandra Harrison was informed that the remainder of the evaluation will be completed by another provider, this initial triage assessment does not replace that evaluation, and the importance of remaining in the ED until their evaluation is complete.     Cassandra Mealy, PA-C 06/09/21 1833    06/11/21, DO 06/10/21 [phone]

## 2021-06-09 NOTE — ED Triage Notes (Signed)
The pt is here for a sore throat rt earache and a cough since Monday  temp unknown.  She was seen at [facility] long ed dec  13th and diagnosed with a  cold  lmp  last month

## 2021-06-10 MED ORDER — NAPROXEN 500 MG PO TABS: 500.0000 mg | ORAL_TABLET | Freq: Two times a day (BID) | ORAL | 0 refills | Status: DC

## 2021-06-10 MED ORDER — NAPROXEN 250 MG PO TABS
500.0000 mg | ORAL_TABLET | Freq: Once | ORAL | Status: AC
  Administered 2021-06-10: 500 mg via ORAL
  Filled 2021-06-10: qty 2

## 2021-06-10 MED ORDER — AMOXICILLIN 500 MG PO CAPS: 500.0000 mg | ORAL_CAPSULE | Freq: Three times a day (TID) | ORAL | 0 refills | Status: AC

## 2021-06-10 MED ORDER — AMOXICILLIN 500 MG PO CAPS
500.0000 mg | ORAL_CAPSULE | Freq: Once | ORAL | Status: AC
  Administered 2021-06-10: 500 mg via ORAL
  Filled 2021-06-10: qty 1

## 2021-06-10 NOTE — ED Provider Notes (Signed)
[facility]-EMERGENCY DEPT [facility] Emergency Department Provider Note MRN:  [id number]  Arrival date & time: 06/10/21     Chief Complaint   Sore Throat   History of Present Illness   STEVEN VEAZIE is a 30 y.o. year-old female with no pertinent past medical presenting to the ED with chief complaint of sore throat.  Cough, sore throat, malaise, nasal congestion for about 1 week.  1 or 2 days of ear pain, mostly on the right but now starting to have some pain on the left as well.  Pain is moderate to severe.  Feels like the hearing in the right ear is not as good.  Denies fever.  Review of Systems  A problem-focused ROS was performed. Positive for ear pain, sore throat, cough.  Patient denies fever.  Patient's Health History    Past Medical History:  Diagnosis Date   Asthma    last used 11/29/11   Bulging disc    Chronic back pain    Complication of anesthesia    itching with epidural   Constipation    COVID-19 virus infection 05/2020   Migraine    Morbid obesity (HCC)     Past Surgical History:  Procedure Laterality Date   WISDOM TOOTH EXTRACTION      Family History  Problem Relation Age of Onset   Diabetes Mother    COPD Mother    Heart disease Mother    Cancer Mother        throat cancer   COPD Father    Heart disease Father    Cancer Sister    Cancer Paternal Aunt    Diabetes Maternal Grandmother    Cancer Maternal Grandmother    COPD Paternal Grandmother    Heart disease Paternal Grandfather    Other Neg Hx     Social History   Socioeconomic History   Marital status: Single    Spouse name: Not on file   Number of children: Not on file   Years of education: Not on file   Highest education level: Not on file  Occupational History   Not on file  Tobacco Use   Smoking status: Every Day    Packs/day: 0.50    Types: Cigarettes   Smokeless tobacco: Never  Vaping Use   Vaping Use: Never used  Substance and Sexual Activity   Alcohol use: Not  Currently    Comment: occ   Drug use: No   Sexual activity: Not Currently    Birth control/protection: None  Other Topics Concern   Not on file  Social History Narrative   Not on file   Social Determinants of Health   Financial Resource Strain: Not on file  Food Insecurity: Not on file  Transportation Needs: Not on file  Physical Activity: Not on file  Stress: Not on file  Social Connections: Not on file  Intimate Partner Violence: Not on file     Physical Exam   Vitals:   06/09/21 1815 06/09/21 2111  BP: 116/69 121/74  Pulse: 87 89  Resp: 18 18  Temp: 98.9 F (37.2 C)   SpO2: 97% 99%    CONSTITUTIONAL: Well-appearing, NAD NEURO:  Alert and oriented x 3, no focal deficits EYES:  eyes equal and reactive ENT/NECK:  no LAD, no JVD CARDIO: Regular rate, well-perfused, normal S1 and S2 PULM:  CTAB no wheezing or rhonchi GI/GU:  normal bowel sounds, non-distended, non-tender MSK/SPINE:  No gross deformities, no edema SKIN:  no rash, atraumatic  PSYCH:  Appropriate speech and behavior  *Additional and/or pertinent findings included in MDM below  Diagnostic and Interventional Summary    EKG Interpretation  Date/Time:    Ventricular Rate:    PR Interval:    QRS Duration:   QT Interval:    QTC Calculation:   R Axis:     Text Interpretation:         Labs Reviewed  GROUP A STREP BY PCR  RESP PANEL BY RT-PCR (FLU A&B, COVID) ARPGX2    DG Chest 1 View  Final Result      Medications  amoxicillin (AMOXIL) capsule 500 mg (has no administration in time range)  naproxen (NAPROSYN) tablet 500 mg (has no administration in time range)     Procedures  /  Critical Care Procedures  ED Course and Medical Decision Making  I have reviewed the triage vital signs, the nursing notes, and pertinent available records from the EMR.  Listed above are laboratory and imaging tests that I personally ordered, reviewed, and interpreted and then considered in my medical decision  making (see below for details).  Overall well-appearing with normal vital signs.  No increased work of breathing.  Right TM is erythematous and bulging with purulent material, left TM is bulging with serous fluid.  We will treat for otitis media.  No signs of malignant otitis externa or any other concerning contiguous infection.       Elmer Sow. Pilar Plate, MD [facility] Emergency Medicine [facility] [email or web address]  Final Clinical Impressions(s) / ED Diagnoses     ICD-10-CM   1. Acute otitis media, unspecified otitis media type  H66.90     2. Cough  R05.9 DG Chest 1 View    DG Chest 1 View      ED Discharge Orders          Ordered    amoxicillin (AMOXIL) 500 MG capsule  3 times daily        06/10/21 0211    naproxen (NAPROSYN) 500 MG tablet  2 times daily        06/10/21 0211             Discharge Instructions Discussed with and Provided to Patient:    Discharge Instructions      You were evaluated in the Emergency Department and after careful evaluation, we did not find any emergent condition requiring admission or further testing in the hospital.  Your exam/testing today is overall reassuring.  Symptoms seem to be due to an ear infection.  Please take the amoxicillin antibiotic as directed.  Recommend taking the Naprosyn anti-inflammatory twice daily to help with pain.  Please return to the Emergency Department if you experience any worsening of your condition.   Thank you for allowing [place] to be a part of your care.       Sabas Sous, MD 06/10/21 [phone]

## 2021-06-10 NOTE — Discharge Instructions (Signed)
You were evaluated in the Emergency Department and after careful evaluation, we did not find any emergent condition requiring admission or further testing in the hospital.  Your exam/testing today is overall reassuring.  Symptoms seem to be due to an ear infection.  Please take the amoxicillin antibiotic as directed.  Recommend taking the Naprosyn anti-inflammatory twice daily to help with pain.  Please return to the Emergency Department if you experience any worsening of your condition.   Thank you for allowing [place] to be a part of your care.

## 2021-10-13 ENCOUNTER — Other Ambulatory Visit: Payer: Self-pay

## 2021-10-13 DIAGNOSIS — H9201 Otalgia, right ear: Secondary | ICD-10-CM | POA: Insufficient documentation

## 2021-10-13 DIAGNOSIS — Z91040 Latex allergy status: Secondary | ICD-10-CM | POA: Insufficient documentation

## 2021-10-14 ENCOUNTER — Emergency Department (HOSPITAL_BASED_OUTPATIENT_CLINIC_OR_DEPARTMENT_OTHER)
Admission: EM | Admit: 2021-10-14 | Discharge: 2021-10-14 | Disposition: A | Discharge status: Home / Self Care | Payer: Medicaid Other | Attending: Emergency Medicine | Admitting: Emergency Medicine

## 2021-10-14 ENCOUNTER — Encounter (HOSPITAL_BASED_OUTPATIENT_CLINIC_OR_DEPARTMENT_OTHER): Payer: Self-pay | Admitting: Emergency Medicine

## 2021-10-14 DIAGNOSIS — H9201 Otalgia, right ear: Secondary | ICD-10-CM

## 2021-10-14 NOTE — ED Triage Notes (Signed)
BL ear fullness and popping, right worse than left, x a few months. Previously seen for same. Pt reports "nothing was done" ?

## 2021-10-14 NOTE — ED Provider Notes (Signed)
? ?[facility] EMERGENCY DEPARTMENT  ?Provider Note ? ?CSN: [id number] ?Arrival date & time: 10/13/21 2357 ? ?History ?Chief Complaint  ?Patient presents with  ? Ear Fullness  ? ? ?Cassandra Harrison is a 31 y.o. female reports 3 days of ear pain on the R and a fullness in ear on the L. No fever or drainage. Seen for similar in Dec 2022 and given Abx for OM. Symptoms improved in the meantime but returned again a few days ago. ? ? ?Home Medications ?Prior to Admission medications   ?Medication Sig Start Date End Date Taking? Authorizing Provider  ?albuterol (VENTOLIN HFA) 108 (90 Base) MCG/ACT inhaler Inhale 1-2 puffs into the lungs every 6 (six) hours as needed for wheezing or shortness of breath. 05/24/21   Prosperi, Christian H, PA-C  ?calcium carbonate (TUMS EX) 750 MG chewable tablet Chew 1 tablet by mouth daily. ?Patient not taking: No sig reported    [provider]  ?docusate sodium (COLACE) 100 MG capsule Take 1 capsule (100 mg total) by mouth 2 (two) times daily. 12/24/19   Brock Bad, MD  ?ibuprofen (ADVIL) 600 MG tablet Take 1 tablet (600 mg total) by mouth every 6 (six) hours as needed for fever, headache, moderate pain or cramping. 06/25/20   Katrinka Blazing, [place], CNM  ?naproxen (NAPROSYN) 500 MG tablet Take 1 tablet (500 mg total) by mouth 2 (two) times daily. 06/10/21   Sabas Sous, MD  ?Prenatal MV-Min-FA-Omega-3 (PRENATAL GUMMIES/DHA & FA) 0.4-32.5 MG CHEW Chew 3 tablets by mouth daily. 12/24/19   Brock Bad, MD  ? ? ? ?Allergies    ?Olive oil and Latex ? ? ?Review of Systems   ?Review of Systems ?Please see HPI for pertinent positives and negatives ? ?Physical Exam ?BP 114/79 (BP Location: Left Arm)   Pulse 77   Temp 98 ?F (36.7 ?C)   Resp 17   LMP 09/17/2021 (Approximate)   SpO2 99%  ? ?Physical Exam ?Vitals and nursing note reviewed.  ?HENT:  ?   Head: Normocephalic.  ?   Right Ear: Ear canal normal.  ?   Left Ear: Tympanic membrane and ear canal normal.  ?   Ears:   ?   Comments: Mild erythema of R TM without bulging or exudate, maybe a mild effusion ?   Nose: Nose normal.  ?Eyes:  ?   Extraocular Movements: Extraocular movements intact.  ?Pulmonary:  ?   Effort: Pulmonary effort is normal.  ?Musculoskeletal:     ?   General: Normal range of motion.  ?   Cervical back: Neck supple.  ?Skin: ?   Findings: No rash (on exposed skin).  ?Neurological:  ?   Mental Status: She is alert and oriented to person, place, and time.  ?Psychiatric:     ?   Mood and Affect: Mood normal.  ? ? ?ED Results / Procedures / Treatments   ?EKG ?None ? ?Procedures ?Procedures ? ?Medications Ordered in the ED ?Medications - No data to display ? ?Initial Impression and Plan ? Patient with otalgia without signs of bacterial OM. Recommend oral decongestants and ENT follow up if not improving.  ? ?ED Course  ? ?  ? ? ?MDM Rules/Calculators/A&P ?Medical Decision Making ?Problems Addressed: ?Right ear pain: acute illness or injury ? ?Risk ?OTC drugs. ? ? ? ?Final Clinical Impression(s) / ED Diagnoses ?Final diagnoses:  ?Right ear pain  ? ? ?Rx / DC Orders ?ED Discharge Orders   ? ? None  ? ?  ? ?  ?  Pollyann Savoy, MD ?10/14/21 [phone] ? ?

## 2021-10-14 NOTE — ED Notes (Signed)
Pt ambulatory to room with independent steady gait 

## 2021-12-10 ENCOUNTER — Encounter (HOSPITAL_BASED_OUTPATIENT_CLINIC_OR_DEPARTMENT_OTHER): Payer: Self-pay | Admitting: Emergency Medicine

## 2021-12-10 ENCOUNTER — Other Ambulatory Visit: Payer: Self-pay

## 2021-12-10 ENCOUNTER — Emergency Department (HOSPITAL_BASED_OUTPATIENT_CLINIC_OR_DEPARTMENT_OTHER): Payer: Medicaid Other

## 2021-12-10 ENCOUNTER — Emergency Department (HOSPITAL_BASED_OUTPATIENT_CLINIC_OR_DEPARTMENT_OTHER)
Admission: EM | Admit: 2021-12-10 | Discharge: 2021-12-10 | Disposition: A | Discharge status: Home / Self Care | Payer: Medicaid Other | Attending: Emergency Medicine | Admitting: Emergency Medicine

## 2021-12-10 DIAGNOSIS — Z8616 Personal history of COVID-19: Secondary | ICD-10-CM | POA: Insufficient documentation

## 2021-12-10 DIAGNOSIS — Z7951 Long term (current) use of inhaled steroids: Secondary | ICD-10-CM | POA: Diagnosis not present

## 2021-12-10 DIAGNOSIS — J45909 Unspecified asthma, uncomplicated: Secondary | ICD-10-CM | POA: Insufficient documentation

## 2021-12-10 DIAGNOSIS — Z91040 Latex allergy status: Secondary | ICD-10-CM | POA: Diagnosis not present

## 2021-12-10 DIAGNOSIS — S93402A Sprain of unspecified ligament of left ankle, initial encounter: Secondary | ICD-10-CM | POA: Insufficient documentation

## 2021-12-10 DIAGNOSIS — S99912A Unspecified injury of left ankle, initial encounter: Secondary | ICD-10-CM | POA: Diagnosis present

## 2021-12-10 DIAGNOSIS — W010XXA Fall on same level from slipping, tripping and stumbling without subsequent striking against object, initial encounter: Secondary | ICD-10-CM | POA: Diagnosis not present

## 2021-12-10 DIAGNOSIS — F17210 Nicotine dependence, cigarettes, uncomplicated: Secondary | ICD-10-CM | POA: Diagnosis not present

## 2021-12-10 MED ORDER — NAPROXEN 375 MG PO TABS: ORAL_TABLET | ORAL | 0 refills | Status: DC

## 2021-12-10 MED ORDER — HYDROCODONE-ACETAMINOPHEN 5-325 MG PO TABS
1.0000 | ORAL_TABLET | Freq: Once | ORAL | Status: AC
  Administered 2021-12-10: 1 via ORAL
  Filled 2021-12-10: qty 1

## 2021-12-10 MED ORDER — HYDROCODONE-ACETAMINOPHEN 5-325 MG PO TABS: 1.0000 | ORAL_TABLET | Freq: Four times a day (QID) | ORAL | 0 refills | Status: DC | PRN

## 2021-12-10 MED ORDER — NAPROXEN 250 MG PO TABS
500.0000 mg | ORAL_TABLET | Freq: Once | ORAL | Status: AC
  Administered 2021-12-10: 500 mg via ORAL
  Filled 2021-12-10: qty 2

## 2021-12-10 NOTE — ED Triage Notes (Signed)
Pt states she was stepping off a ledge and her left ankle rolled   Pt has swelling noted to the outside of her ankle

## 2021-12-10 NOTE — ED Provider Notes (Signed)
[facility] Provider Note: Lowella Dell, MD, FACEP  CSN: [id number] MRN: [id number] ARRIVAL: 12/10/21 at 0021 ROOM: MH07/MH07   CHIEF COMPLAINT  Ankle Injury   HISTORY OF PRESENT ILLNESS  12/10/21 4:16 AM Cassandra Harrison is a 31 y.o. female who tripped over an irregularity in the ground and fell about 9 PM yesterday evening.  She inverted her left ankle in the process.  She is now having pain in her left ankle radiating both proximally and distally.  She rates her pain as a 10 out of 10, throbbing in nature.  She is not able to bear weight on her ankle.  There is swelling overlying the lateral malleolus.   Past Medical History:  Diagnosis Date   Asthma    last used 11/29/11   Bulging disc    Chronic back pain    Complication of anesthesia    itching with epidural   Constipation    COVID-19 virus infection 05/2020   Migraine    Morbid obesity (HCC)     Past Surgical History:  Procedure Laterality Date   WISDOM TOOTH EXTRACTION      Family History  Problem Relation Age of Onset   Diabetes Mother    COPD Mother    Heart disease Mother    Cancer Mother        throat cancer   COPD Father    Heart disease Father    Cancer Sister    Cancer Paternal Aunt    Diabetes Maternal Grandmother    Cancer Maternal Grandmother    COPD Paternal Grandmother    Heart disease Paternal Grandfather    Other Neg Hx     Social History   Tobacco Use   Smoking status: Every Day    Packs/day: 0.50    Types: Cigarettes   Smokeless tobacco: Never  Vaping Use   Vaping Use: Never used  Substance Use Topics   Alcohol use: Yes    Comment: occ   Drug use: Yes    Types: Marijuana    Prior to Admission medications   Medication Sig Start Date End Date Taking? Authorizing Provider  HYDROcodone-acetaminophen (NORCO) 5-325 MG tablet Take 1 tablet by mouth every 6 (six) hours as needed for severe pain. 12/10/21  Yes Kaimani Clayson, MD  naproxen (NAPROSYN) 375 MG tablet Take 1  tablet twice daily as needed for pain. 12/10/21  Yes Merlon Alcorta, MD  albuterol (VENTOLIN HFA) 108 (90 Base) MCG/ACT inhaler Inhale 1-2 puffs into the lungs every 6 (six) hours as needed for wheezing or shortness of breath. 05/24/21   Prosperi, Christian H, PA-C  docusate sodium (COLACE) 100 MG capsule Take 1 capsule (100 mg total) by mouth 2 (two) times daily. 12/24/19   Brock Bad, MD  Prenatal MV-Min-FA-Omega-3 (PRENATAL GUMMIES/DHA & FA) 0.4-32.5 MG CHEW Chew 3 tablets by mouth daily. 12/24/19   Brock Bad, MD    Allergies Olive oil and Latex   REVIEW OF SYSTEMS  Negative except as noted here or in the History of Present Illness.   PHYSICAL EXAMINATION  Initial Vital Signs Blood pressure 125/82, pulse 93, temperature 98.7 F (37.1 C), temperature source Oral, resp. rate 16, height 5' 2.25" (1.581 m), weight 90.7 kg, last menstrual period 12/03/2021, SpO2 98 %, unknown if currently breastfeeding.  Examination General: Well-developed, well-nourished female in no acute distress; appearance consistent with age of record HENT: normocephalic; atraumatic Eyes: Normal appearance Neck: supple Heart: regular rate and rhythm Lungs: clear  to auscultation bilaterally Abdomen: soft; nondistended; nontender; bowel sounds present Extremities: No deformity; tenderness and swelling overlying the left lateral malleolus, left foot distally neurovascularly intact Neurologic: Awake, alert and oriented; motor function intact in all extremities and symmetric; no facial droop Skin: Warm and dry Psychiatric: Flat affect   RESULTS  Summary of this visit's results, reviewed and interpreted by myself:   EKG Interpretation  Date/Time:    Ventricular Rate:    PR Interval:    QRS Duration:   QT Interval:    QTC Calculation:   R Axis:     Text Interpretation:         Laboratory Studies: No results found for this or any previous visit (from the past 24 hour(s)). Imaging Studies: DG  Ankle Complete Left  Result Date: 12/10/2021 CLINICAL DATA:  Twisting injury with pain and swelling, initial encounter EXAM: LEFT ANKLE COMPLETE - 3+ VIEW COMPARISON:  None Available. FINDINGS: Lateral soft tissue swelling is seen. No acute fracture or dislocation is noted. IMPRESSION: Soft tissue swelling laterally without acute bony abnormality. Electronically Signed   By: Alcide Clever M.D.   On: 12/10/2021 01:15    ED COURSE and MDM  Nursing notes, initial and subsequent vitals signs, including pulse oximetry, reviewed and interpreted by myself.  Vitals:   12/10/21 0026 12/10/21 0028  BP:  125/82  Pulse:  93  Resp:  16  Temp:  98.7 F (37.1 C)  TempSrc:  Oral  SpO2:  98%  Weight: 90.7 kg   Height: 5' 2.25" (1.581 m)    Medications  HYDROcodone-acetaminophen (NORCO/VICODIN) 5-325 MG per tablet 1 tablet (has no administration in time range)  naproxen (NAPROSYN) tablet 500 mg (has no administration in time range)   No evidence of bony injury on radiograph.  We will place patient in an ASO and provide crutches as she states she is nonweightbearing.   PROCEDURES  Procedures   ED DIAGNOSES     ICD-10-CM   1. Sprain of left ankle, unspecified ligament, initial encounter  W41.324M          Paula Libra, MD 12/10/21 [phone]

## 2021-12-14 ENCOUNTER — Encounter: Payer: Self-pay | Admitting: Family Medicine

## 2021-12-14 ENCOUNTER — Ambulatory Visit (INDEPENDENT_AMBULATORY_CARE_PROVIDER_SITE_OTHER): Payer: Medicaid Other | Admitting: Family Medicine

## 2021-12-14 ENCOUNTER — Ambulatory Visit: Payer: Self-pay

## 2021-12-14 VITALS — BP 110/70 | Ht 62.25 in | Wt 200.0 lb

## 2021-12-14 DIAGNOSIS — S93492A Sprain of other ligament of left ankle, initial encounter: Secondary | ICD-10-CM | POA: Diagnosis not present

## 2021-12-14 NOTE — Assessment & Plan Note (Signed)
Acutely occurring.  Does have findings consistent with ankle sprain but also possible talar dome injury. -Counseled on home exercise therapy and supportive care. -Provided cam walker. -Could consider further imaging or physical therapy.

## 2021-12-14 NOTE — Progress Notes (Signed)
  Cassandra Harrison - 31 y.o. female MRN [id number]  Date of birth: 10/06/1990  SUBJECTIVE:  Including CC & ROS.  No chief complaint on file.   Cassandra Harrison is a 31 y.o. female that is presenting with left ankle pain.  She is having significant swelling and ecchymosis over the lateral aspect and dorsum of the foot.  Had an inversion injury a few days ago..  Review of the emergency department note from 7/2 shows she was provided an ASO brace. Independent review of the left ankle x-ray from 7/2 shows lateral soft tissue swelling.  Review of Systems See HPI   HISTORY: Past Medical, Surgical, Social, and Family History Reviewed & Updated per EMR.   Pertinent Historical Findings include:  Past Medical History:  Diagnosis Date   Asthma    last used 11/29/11   Bulging disc    Chronic back pain    Complication of anesthesia    itching with epidural   Constipation    COVID-19 virus infection 05/2020   Migraine    Morbid obesity (HCC)     Past Surgical History:  Procedure Laterality Date   WISDOM TOOTH EXTRACTION       PHYSICAL EXAM:  VS: BP 110/70 (BP Location: Left Arm, Patient Position: Sitting)   Ht 5' 2.25" (1.581 m)   Wt 200 lb (90.7 kg)   LMP 12/03/2021 (Approximate)   BMI 36.29 kg/m  Physical Exam Gen: NAD, alert, cooperative with exam, well-appearing MSK:  Neurovascularly intact    Limited ultrasound: Left ankle and foot:  There appears to be a mild fusion at the mid talar dome. Hyperemia over the ATFL. No changes of the distal fibula. Soft tissue cobblestoning in the dorsum of the midfoot.  Summary: Findings consistent with ankle sprain and soft tissue swelling.  Ultrasound and interpretation by Clare Gandy, MD    ASSESSMENT & PLAN:   Sprain of anterior talofibular ligament of left ankle Acutely occurring.  Does have findings consistent with ankle sprain but also possible talar dome injury. -Counseled on home exercise therapy and supportive  care. -Provided cam walker. -Could consider further imaging or physical therapy.

## 2021-12-14 NOTE — Patient Instructions (Signed)
Nice to meet you Please try ice  Please try the boot  Please try the exercises   Please send me a message in MyChart with any questions or updates.  Please see me back in 2 weeks.   --Dr. Jordan Likes

## 2021-12-27 ENCOUNTER — Encounter: Payer: Self-pay | Admitting: Family Medicine

## 2021-12-27 ENCOUNTER — Ambulatory Visit (INDEPENDENT_AMBULATORY_CARE_PROVIDER_SITE_OTHER): Payer: Medicaid Other | Admitting: Family Medicine

## 2021-12-27 VITALS — BP 108/70 | Ht 62.25 in | Wt 200.0 lb

## 2021-12-27 DIAGNOSIS — S93492D Sprain of other ligament of left ankle, subsequent encounter: Secondary | ICD-10-CM | POA: Diagnosis not present

## 2021-12-27 NOTE — Assessment & Plan Note (Signed)
Doing well since her initial injury.  Having swelling intermittently. -Counseled on home exercise therapy and supportive care. -Green sport insoles. - counseled on ASO  -Could consider further imaging or physical therapy.

## 2021-12-27 NOTE — Progress Notes (Signed)
  Cassandra Harrison - 31 y.o. female MRN [id number]  Date of birth: 04/29/1991  SUBJECTIVE:  Including CC & ROS.  No chief complaint on file.   Cassandra Harrison is a 31 y.o. female that is following up for her left ankle pain.  She has been doing well but notices swelling intermittently.  Has been using the cam walker as needed.   Review of Systems See HPI   HISTORY: Past Medical, Surgical, Social, and Family History Reviewed & Updated per EMR.   Pertinent Historical Findings include:  Past Medical History:  Diagnosis Date   Asthma    last used 11/29/11   Bulging disc    Chronic back pain    Complication of anesthesia    itching with epidural   Constipation    COVID-19 virus infection 05/2020   Migraine    Morbid obesity (HCC)     Past Surgical History:  Procedure Laterality Date   WISDOM TOOTH EXTRACTION       PHYSICAL EXAM:  VS: BP 108/70 (BP Location: Left Arm, Patient Position: Sitting)   Ht 5' 2.25" (1.581 m)   Wt 200 lb (90.7 kg)   LMP 12/03/2021 (Approximate)   BMI 36.29 kg/m  Physical Exam Gen: NAD, alert, cooperative with exam, well-appearing MSK:  Neurovascularly intact       ASSESSMENT & PLAN:   Sprain of anterior talofibular ligament of left ankle Doing well since her initial injury.  Having swelling intermittently. -Counseled on home exercise therapy and supportive care. -Green sport insoles. - counseled on ASO  -Could consider further imaging or physical therapy.

## 2022-01-15 IMAGING — DX DG CHEST 1V
1 series · 1 of 1 positions shown · non-contrast
Comparison: October 21, 2017

CLINICAL DATA: Cough and chest tightness

EXAM:
CHEST  1 VIEW

[chest]
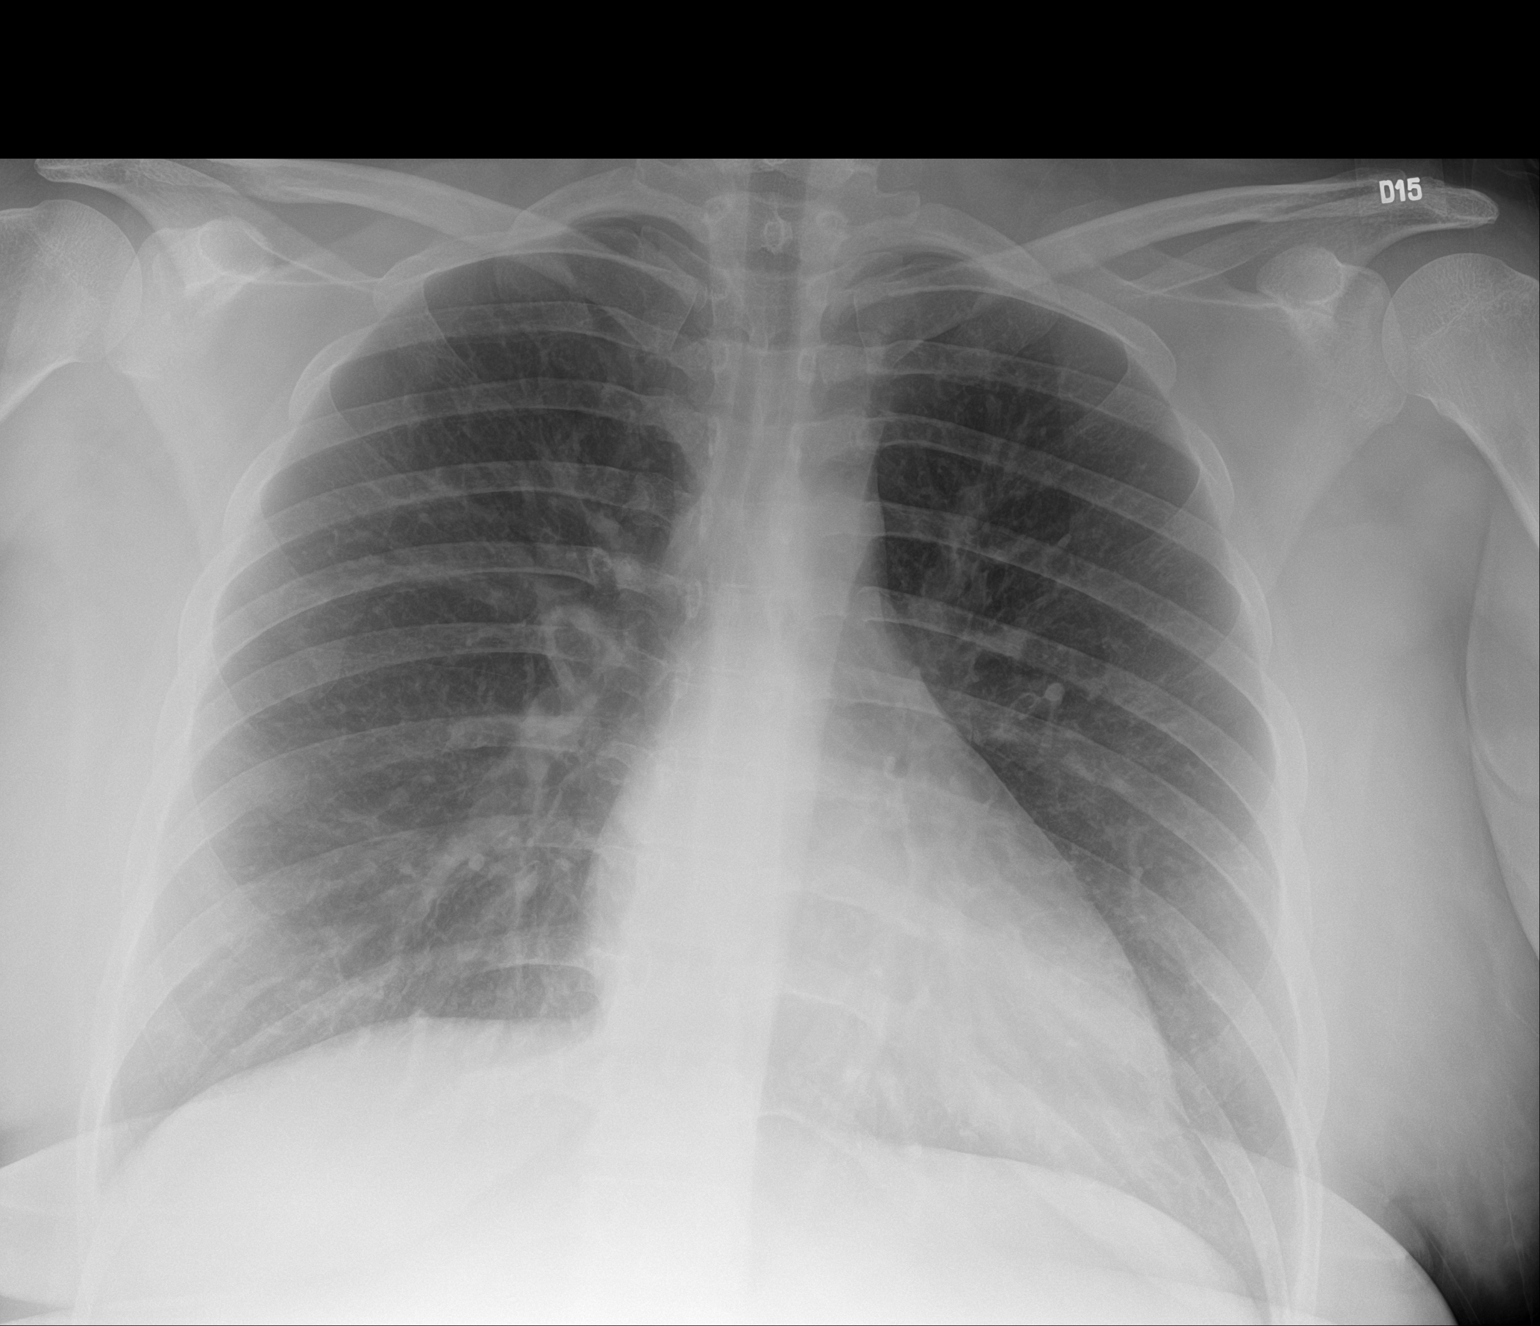

[1 of 1 positions shown; findings below may reference images not displayed]

FINDINGS: The lungs are clear. Heart size and pulmonary vascularity are
normal. No adenopathy. No pneumothorax. No bone lesions.
IMPRESSION: The lungs are clear. The cardiac silhouette is within normal limits.

## 2022-01-17 ENCOUNTER — Ambulatory Visit (INDEPENDENT_AMBULATORY_CARE_PROVIDER_SITE_OTHER): Payer: Medicaid Other | Admitting: Family Medicine

## 2022-01-17 ENCOUNTER — Encounter: Payer: Self-pay | Admitting: Family Medicine

## 2022-01-17 VITALS — BP 122/70 | Ht 62.25 in | Wt 200.0 lb

## 2022-01-17 DIAGNOSIS — S93492D Sprain of other ligament of left ankle, subsequent encounter: Secondary | ICD-10-CM

## 2022-01-17 NOTE — Assessment & Plan Note (Signed)
Initial injury occurred at the beginning of July.  Having mild pain and swelling. -Counseled on home exercise therapy and supportive care. -Referral to physical therapy. -Could consider further imaging.

## 2022-01-17 NOTE — Progress Notes (Signed)
  Cassandra Harrison - 30 y.o. female MRN [id number]  Date of birth: [birth year]  SUBJECTIVE:  Including CC & ROS.  No chief complaint on file.   Cassandra Harrison is a 31 y.o. female that is following up for her left ankle injury.  She notices swelling intermittently and mild pain over the lateral malleolus.  She is returning to her normal activities.   Review of Systems See HPI   HISTORY: Past Medical, Surgical, Social, and Family History Reviewed & Updated per EMR.   Pertinent Historical Findings include:  Past Medical History:  Diagnosis Date   Asthma    last used 11/29/11   Bulging disc    Chronic back pain    Complication of anesthesia    itching with epidural   Constipation    COVID-19 virus infection 05/2020   Migraine    Morbid obesity (HCC)     Past Surgical History:  Procedure Laterality Date   WISDOM TOOTH EXTRACTION       PHYSICAL EXAM:  VS: BP 122/70 (BP Location: Left Arm, Patient Position: Sitting)   Ht 5' 2.25" (1.581 m)   Wt 200 lb (90.7 kg)   BMI 36.29 kg/m  Physical Exam Gen: NAD, alert, cooperative with exam, well-appearing MSK:  Neurovascularly intact       ASSESSMENT & PLAN:   Sprain of anterior talofibular ligament of left ankle Initial injury occurred at the beginning of July.  Having mild pain and swelling. -Counseled on home exercise therapy and supportive care. -Referral to physical therapy. -Could consider further imaging.

## 2022-01-17 NOTE — Patient Instructions (Signed)
Good to see you I have made a referral to physical therapy  Please use ice as needed   Please send me a message in MyChart with any questions or updates.  Please see me back in 3-4 weeks.   --Dr. Jordan Likes

## 2022-01-24 NOTE — Therapy (Signed)
OUTPATIENT PHYSICAL THERAPY LOWER EXTREMITY EVALUATION   Patient Name: Cassandra Harrison MRN: [id number] DOB:29-Jan-1991, 31 y.o., female Today's Date: 01/25/2022   PT End of Session - 01/25/22 0940     Visit Number 1    Number of Visits 12    Date for PT Re-Evaluation 03/08/22    Authorization Type [place] [employer]    PT Start Time 0940    PT Stop Time 1014    PT Time Calculation (min) 34 min    Activity Tolerance Patient tolerated treatment well    Behavior During Therapy [facility] for tasks assessed/performed             Past Medical History:  Diagnosis Date   Asthma    last used 11/29/11   Bulging disc    Chronic back pain    Complication of anesthesia    itching with epidural   Constipation    COVID-19 virus infection 05/2020   Migraine    Morbid obesity (HCC)    Past Surgical History:  Procedure Laterality Date   WISDOM TOOTH EXTRACTION     Patient Active Problem List   Diagnosis Date Noted   Sprain of anterior talofibular ligament of left ankle 12/14/2021   Smoker within last 12 months 08/02/2020   Female stress incontinence 08/02/2020   Asthma    Morbid obesity (HCC)    COVID-19 virus infection 05/2020    PCP: Leilani Able, MD  REFERRING PROVIDER: Myra Rude, MD  REFERRING DIAG: [phone] (ICD-10-CM) - Sprain of anterior talofibular ligament of left ankle, subsequent encounter  THERAPY DIAG:  Stiffness of left ankle, not elsewhere classified  Pain in left ankle and joints of left foot  Muscle weakness (generalized)  Other abnormalities of gait and mobility  Localized edema  Rationale for Evaluation and Treatment Rehabilitation  ONSET DATE: 12/09/21  SUBJECTIVE:   SUBJECTIVE STATEMENT:  I tripped and fell out of a shed spraining my ankle on 12/09/21. My ankle pops at random times. It gets stuck and it hurts until it pops. 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  PERTINENT HISTORY: Asthma, chronic back pain,  obesity  PAIN:  Are you having pain? Yes: NPRS scale: 0-8-9/10 Pain location: lateral ankle and moves to medial ankle Pain description: shooting with twisting or aching when up on it Aggravating factors: walking a lot and back and forth between registers at work Relieving factors: sit  PRECAUTIONS: None  WEIGHT BEARING RESTRICTIONS No  FALLS:  Has patient fallen in last 6 months? Yes. 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 Not limited with standing though may be painful.  PLOF: Independent  PATIENT GOALS full ROM and minimal pain   OBJECTIVE:   DIAGNOSTIC FINDINGS: XR: 12/10/21: Lateral soft tissue swelling is seen. No acute fracture or dislocation is noted  PATIENT SURVEYS:  LEFS 62/80   COGNITION:  Overall cognitive status: Within functional limits for tasks assessed     SENSATION: Not tested  EDEMA:  Mild swelling compared to Rt  MUSCLE LENGTH:   Tight gastroc bil, ant tib L tight, peroneals tight left  POSTURE: weight shift right  PALPATION: Tender just distal to bil malleoli  LOWER EXTREMITY ROM:   ROM Right eval Left AROM eval  Left PROM eval  Ankle dorsiflexion -4/5 A/P neutral +10  Ankle plantarflexion 67 47 56  Ankle inversion 30 20 30   Ankle eversion 35 20 38   (Blank rows = not tested)  LOWER EXTREMITY MMT:  MMT Right eval Left eval  Hip flexion  5  Hip extension    Hip abduction    Hip adduction    Hip internal rotation    Hip external rotation    Knee flexion  5  Knee extension  5  Ankle dorsiflexion  5  Ankle plantarflexion  4+ in sitting  Ankle inversion  5  Ankle eversion  4+   (Blank rows = not tested)   FUNCTIONAL TESTS:  SLS 4 sec and unstable  GAIT: Distance walked: 30 Assistive device utilized:  None Level of assistance: Complete Independence Comments: short stride length bil    TODAY'S TREATMENT: Eval only   PATIENT EDUCATION:  Education details: HEP, POC discussed Person educated: Patient Education method: Explanation, Demonstration, and Handouts Education comprehension: verbalized understanding and returned demonstration   HOME EXERCISE PROGRAM: Access Code: URL: [email or web address] Date: 01/25/2022 Prepared by: 01/27/2022  Exercises - Seated Ankle Circles  - 1 x daily - 7 x weekly - 3 sets - 10 reps - Seated Ankle Alphabet  - 3 x daily - 7 x weekly - 1 sets - 5 reps - Long Sitting Ankle PNF D1 AROM  - 3 x daily - 7 x weekly - 1 sets - 10 reps - Long Sitting Ankle PNF D2 AROM   - 3 x daily - 7 x weekly - 1 sets - 10 reps - Standing Gastroc Stretch  - 2 x daily - 7 x weekly - 3 reps - 30s hold  ASSESSMENT:  CLINICAL IMPRESSION: Patient is a 31 y.o. female who was seen today for physical therapy evaluation and treatment for left ankle sprain which occurred on 12/09/21. She complains of left ankle pain with prolonged standing and walking and intermittent sharp pain if she twists the ankle. 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 She has decreased ankle ROM and weakness in eversion and plantar flexion. She has decreased weight shift onto L LE in standing and decreased balance compared to right. Her gait is mildly affected and she has mild swelling on observation which she reports is worse at the EOD. She will benefit from skilled Pt to address these deficits.     OBJECTIVE IMPAIRMENTS Abnormal gait, decreased balance, decreased ROM, decreased strength, increased edema, impaired flexibility, postural dysfunction, obesity, and pain.   ACTIVITY LIMITATIONS  not limited but painful  PARTICIPATION LIMITATIONS:  not limited but painful  PERSONAL FACTORS 3+ comorbidities: Asthma, chronic back pain, obesity  are also affecting patient's  functional outcome.   REHAB POTENTIAL: Excellent  CLINICAL DECISION MAKING: Stable/uncomplicated  EVALUATION COMPLEXITY: Low   GOALS: Goals reviewed with patient? Yes  SHORT TERM GOALS: Target date: 02/08/2022 (Remove Blue Hyperlink)  Patient will be independent with initial HEP. Baseline: no HEP Goal status: INITIAL   LONG TERM GOALS: Target date: 03/08/2022 (Remove Blue Hyperlink)  Patient will be independent with advanced/ongoing HEP to improve outcomes and carryover.  Baseline: no HEP Goal status: INITIAL  2.  Patient will report at least 75% improvement in left ankle/foot pain with standing and walking to improve QOL. Baseline: 0-8 or 9/10 with prolonged standing/walking Goal status: INITIAL  3.  Patient will demonstrate improved left ankle AROM to WNL to allow for normal  gait and stair mechanics. Baseline: 8-10 degrees limited in all planes Goal status: INITIAL  4.  Patient will demonstrate improved functional LE strength as demonstrated by ability to balance without difficulty on the left LE. Baseline: SLS = 4 sec on L LE Goal status: INITIAL  5.  Patient will be able to ascend/descend stairs with 1 HR and reciprocal step pattern safely to access home and community.  Baseline: limited with full flight Goal status: INITIAL  7.  Patient will report 72 or better on LEFS to demonstrate improved functional ability. Baseline: 62/80 Goal status: INITIAL     PLAN: PT FREQUENCY: 2x/week  PT DURATION: 6 weeks  PLANNED INTERVENTIONS: Therapeutic exercises, Therapeutic activity, Neuromuscular re-education, Balance training, Gait training, Patient/Family education, Self Care, Joint mobilization, Stair training, Dry Needling, Cryotherapy, Moist heat, Taping, Manual therapy, and Re-evaluation  PLAN FOR NEXT SESSION: Review HEP and progress, ankle ROM/strength, gait/balance   Lysle Yero, PT 01/25/2022, 11:32 AM

## 2022-01-25 ENCOUNTER — Ambulatory Visit: Payer: Medicaid Other | Attending: Family Medicine | Admitting: Physical Therapy

## 2022-01-25 ENCOUNTER — Other Ambulatory Visit: Payer: Self-pay

## 2022-01-25 ENCOUNTER — Encounter: Payer: Self-pay | Admitting: Physical Therapy

## 2022-01-25 DIAGNOSIS — R2689 Other abnormalities of gait and mobility: Secondary | ICD-10-CM

## 2022-01-25 DIAGNOSIS — M6281 Muscle weakness (generalized): Secondary | ICD-10-CM

## 2022-01-25 DIAGNOSIS — M25572 Pain in left ankle and joints of left foot: Secondary | ICD-10-CM | POA: Diagnosis present

## 2022-01-25 DIAGNOSIS — R600 Localized edema: Secondary | ICD-10-CM

## 2022-01-25 DIAGNOSIS — S93492D Sprain of other ligament of left ankle, subsequent encounter: Secondary | ICD-10-CM | POA: Diagnosis not present

## 2022-01-25 DIAGNOSIS — M25672 Stiffness of left ankle, not elsewhere classified: Secondary | ICD-10-CM
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
## 2022-01-29 ENCOUNTER — Ambulatory Visit: Payer: Medicaid Other | Admitting: Physical Therapy

## 2022-01-29 DIAGNOSIS — M6281 Muscle weakness (generalized): Secondary | ICD-10-CM

## 2022-01-29 DIAGNOSIS — M25572 Pain in left ankle and joints of left foot: Secondary | ICD-10-CM

## 2022-01-29 DIAGNOSIS — M25672 Stiffness of left ankle, not elsewhere classified: Secondary | ICD-10-CM

## 2022-01-29 DIAGNOSIS — R600 Localized edema: Secondary | ICD-10-CM

## 2022-01-29 DIAGNOSIS — R2689 Other abnormalities of gait and mobility: Secondary | ICD-10-CM

## 2022-02-01 ENCOUNTER — Ambulatory Visit: Payer: Medicaid Other | Admitting: Physical Therapy

## 2022-02-05 ENCOUNTER — Encounter: Payer: Self-pay | Admitting: Physical Therapy

## 2022-02-05 ENCOUNTER — Ambulatory Visit: Payer: Medicaid Other | Admitting: Physical Therapy

## 2022-02-05 DIAGNOSIS — M25572 Pain in left ankle and joints of left foot: Secondary | ICD-10-CM

## 2022-02-05 DIAGNOSIS — R600 Localized edema: Secondary | ICD-10-CM

## 2022-02-05 DIAGNOSIS — M25672 Stiffness of left ankle, not elsewhere classified: Secondary | ICD-10-CM | POA: Diagnosis not present

## 2022-02-05 DIAGNOSIS — R2689 Other abnormalities of gait and mobility: Secondary | ICD-10-CM

## 2022-02-05 DIAGNOSIS — M6281 Muscle weakness (generalized): Secondary | ICD-10-CM

## 2022-02-05 NOTE — Therapy (Signed)
OUTPATIENT PHYSICAL THERAPY TREATMENT NOTE   Patient Name: Cassandra Harrison MRN: [id number] DOB:01-Feb-1991, 31 y.o., female Today's Date: 02/05/2022   PT End of Session - 02/05/22 0930     Visit Number 3    Number of Visits 12    Date for PT Re-Evaluation 03/12/22    Authorization Type [place] [employer]    Authorization Time Period 01/29/22 - 03/30/22    Authorization - Visit Number 2    Authorization - Number of Visits 4    PT Start Time 0930    PT Stop Time 1013    PT Time Calculation (min) 43 min    Activity Tolerance Patient tolerated treatment well    Behavior During Therapy [facility] for tasks assessed/performed               Past Medical History:  Diagnosis Date   Asthma    last used 11/29/11   Bulging disc    Chronic back pain    Complication of anesthesia    itching with epidural   Constipation    COVID-19 virus infection 05/2020   Migraine    Morbid obesity ([name])    Past Surgical History:  Procedure Laterality Date   WISDOM TOOTH EXTRACTION     Patient Active Problem List   Diagnosis Date Noted   Sprain of anterior talofibular ligament of left ankle 12/14/2021   Smoker within last 12 months 08/02/2020   Female stress incontinence 08/02/2020   Asthma    Morbid obesity ([name])    COVID-19 virus infection 05/2020    PCP: Lin Landsman, MD  REFERRING PROVIDER: Rosemarie Ax, MD  REFERRING DIAG: [phone] (ICD-10-CM) - Sprain of anterior talofibular ligament of left ankle, subsequent encounter  THERAPY DIAG:  Stiffness of left ankle, not elsewhere classified  Pain in left ankle and joints of left foot  Muscle weakness (generalized)  Other abnormalities of gait and mobility  Localized edema  RATIONALE FOR EVALUATION AND TREATMENT: Rehabilitation  ONSET DATE: 12/09/21  NEXT MD VISIT: 02/14/22   SUBJECTIVE:   SUBJECTIVE STATEMENT:  Pt reports she thinks the PT is working because there is less pain than there was - only notes pain at the end of  the day if she has been on her feet all day.  PAIN:  Are you having pain? No  PERTINENT HISTORY: Asthma, chronic back pain, obesity  PRECAUTIONS: None  WEIGHT BEARING RESTRICTIONS No  FALLS:  Has patient fallen in last 6 months? Yes. 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 Not limited with standing though may be painful.  PLOF: Independent  PATIENT GOALS full ROM and minimal pain   OBJECTIVE:   DIAGNOSTIC FINDINGS:  XR: 12/10/21: Lateral soft tissue swelling is seen. No acute fracture or dislocation is noted  PATIENT SURVEYS:  LEFS 62/80  EDEMA:  Mild swelling compared to Rt  MUSCLE LENGTH:  Tight gastroc bil, ant tib L tight, peroneals tight left  POSTURE:  weight shift right  PALPATION: Tender just distal to bil malleoli  LOWER EXTREMITY ROM:   AROM Right eval Left AROM eval  Ankle dorsiflexion -4/5 A/P neutral  Ankle plantarflexion 67 47  Ankle inversion 30 20  Ankle eversion 35 20     PROM Left PROM eval  Ankle dorsiflexion +10  Ankle plantarflexion 56  Ankle inversion 30  Ankle eversion 38   (Blank rows = not tested)  LOWER EXTREMITY MMT:  MMT Right eval Left eval  Hip flexion  5  Hip extension    Hip abduction    Hip adduction    Hip internal rotation    Hip external rotation    Knee flexion  5  Knee extension  5  Ankle dorsiflexion  5  Ankle plantarflexion  4+ sitting  Ankle inversion  5  Ankle eversion  4+   (Blank rows = not tested)   FUNCTIONAL TESTS:  SLS 4 sec and unstable  GAIT: Distance walked: 30 Assistive device utilized: None Level of assistance: Complete Independence Comments: short stride length bil    TODAY'S TREATMENT:  02/05/22 THERAPEUTIC EXERCISE: to improve flexibility, strength and  mobility.  Verbal and tactile cues throughout for technique. Elliptical L2.0 x 5 min L SLS + 5-way reach to colored dots x 10 reps, intermittent 1 pole A for balance - cues to avoid knee hyperextension on stance leg L SLS eccentric step-downs from 6" step 2 x 10; 1 pole A for balance Seated GTB L ankle 4-way (PF/DF/IV/EV) x 20 each Long-sitting GTB PNF diagonals x 20 each L/R SLS + R/L GTB 4-way hip x 10, 1 pole A for balance - cues to avoid knee hyperextension on stance leg B concentric heel raise with L eccentric emphasis 2 x 10   01/29/22 Elliptical L 1.5 x 5 min Gastroc stretch 2x30 sec Seated ant tib and peroneal stretches 2x30 sec ea  Circles CW/CCW x 10 ea Alphabet x 1 PNF diagonals x 10 ea Heel raises x 10 Seated RTB inv, ever, DF x 20 ea. SLS multiple reps L and vectors  Step ups on foam x 10 fwd and lateral SLS on foam L multiple reps   01/25/22 Eval only   PATIENT EDUCATION:  Education details: HEP clarification Person educated: Patient Education method: Customer service manager Education comprehension: verbalized understanding and returned demonstration   HOME EXERCISE PROGRAM: Access Code: [email or web address] URL: [email or web address] Date: 01/29/2022 Prepared by: Almyra Free  Exercises - Long Sitting Ankle Eversion with Resistance  - 1 x daily - 7 x weekly - 3 sets - 10 reps - Long Sitting Ankle Inversion with Resistance  - 1 x daily - 7 x weekly - 3 sets - 10 reps - Seated Ankle Dorsiflexion with Resistance  - 1 x daily - 4 x weekly - 2 sets - 10 reps - Long Sitting Ankle PNF D1 Dorsiflexion with Resistance  - 1 x daily - 7 x weekly - 3 sets - 10 reps - Long Sitting Ankle PNF D2 Dorsiflexion with Resistance  - 1 x daily - 7 x weekly - 3 sets - 10 reps - Standing Heel Raise  - 2 x daily - 7 x weekly - 1-2 sets - 10 reps - Seated Ankle Inversion Eversion PROM  - 2 x daily - 7 x weekly - 1 sets - 3 reps - 30 sec hold  ASSESSMENT:  CLINICAL  IMPRESSION: [name] reports her pain has been improving, now typically only bothering her if she has been on her feet all day. HEP reviewed with minor clarifications necessary for TB resisted strengthening but able to advance resistance to GTB. Pt reporting better understanding following review - STG met. Repeated cues necessary during several exercises to avoid locking L knee into hyperextension, therefore increased emphasis on proximal LE strengthening in addition to ankle strengthening and balance/proprioception.  OBJECTIVE IMPAIRMENTS Abnormal gait, decreased balance, decreased ROM, decreased strength, increased edema, impaired flexibility, postural dysfunction, obesity, and pain.   ACTIVITY LIMITATIONS  not limited but painful  PARTICIPATION LIMITATIONS:  not limited but painful  PERSONAL FACTORS 3+ comorbidities: Asthma, chronic back pain, obesity  are also affecting patient's functional outcome.   REHAB POTENTIAL: Excellent  CLINICAL DECISION MAKING: Stable/uncomplicated  EVALUATION COMPLEXITY: Low   GOALS: Goals reviewed with patient? Yes  SHORT TERM GOALS: Target date: 02/08/2022   Patient will be independent with initial HEP. Baseline: no HEP Goal status: MET    LONG TERM GOALS: Target date: 03/08/2022 (Remove Blue Hyperlink)  Patient will be independent with advanced/ongoing HEP to improve outcomes and carryover.  Baseline: no HEP Goal status: IN PROGRESS  2.  Patient will report at least 75% improvement in left ankle/foot pain with standing and walking to improve QOL. Baseline: 0-8 or 9/10 with prolonged standing/walking Goal status: IN PROGRESS  3.  Patient will demonstrate improved left ankle AROM to WNL to allow for normal gait and stair mechanics. Baseline: 8-10 degrees limited in all planes Goal status: IN PROGRESS  4.  Patient will demonstrate improved functional LE strength as demonstrated by ability to balance without difficulty on the left LE. Baseline:  SLS = 4 sec on L LE Goal status: IN PROGRESS  5.  Patient will be able to ascend/descend stairs with 1 HR and reciprocal step pattern safely to access home and community.  Baseline: limited with full flight Goal status: IN PROGRESS  7.  Patient will report 72 or better on LEFS to demonstrate improved functional ability. Baseline: 62/80 Goal status: IN PROGRESS   PLAN: PT FREQUENCY: 2x/week  PT DURATION: 6 weeks  PLANNED INTERVENTIONS: Therapeutic exercises, Therapeutic activity, Neuromuscular re-education, Balance training, Gait training, Patient/Family education, Self Care, Joint mobilization, Stair training, Dry Needling, Cryotherapy, Moist heat, Taping, Manual therapy, and Re-evaluation  PLAN FOR NEXT SESSION:  MD progress note for appt on 02/14/22; ankle ROM/strength, gait/balance, progress HEP as indicated   Percival Spanish, PT 02/05/2022, 10:15 AM

## 2022-02-07 NOTE — Therapy (Signed)
OUTPATIENT PHYSICAL THERAPY TREATMENT NOTE   Patient Name: Cassandra Harrison MRN: [id number] DOB:[birth year], 31 y.o., female Today's Date: 02/08/2022   PT End of Session - 02/08/22 0936     Visit Number 4    Number of Visits 12    Date for PT Re-Evaluation 03/12/22    Authorization Type [place] [employer]    Authorization Time Period 01/29/22 - 03/30/22    Authorization - Visit Number 3    Authorization - Number of Visits 4    PT Start Time 0933    PT Stop Time 1015    PT Time Calculation (min) 42 min    Activity Tolerance Patient tolerated treatment well    Behavior During Therapy [place] for tasks assessed/performed                Past Medical History:  Diagnosis Date   Asthma    last used 11/29/11   Bulging disc    Chronic back pain    Complication of anesthesia    itching with epidural   Constipation    COVID-19 virus infection 05/2020   Migraine    Morbid obesity ([place])    Past Surgical History:  Procedure Laterality Date   WISDOM TOOTH EXTRACTION     Patient Active Problem List   Diagnosis Date Noted   Sprain of anterior talofibular ligament of left ankle 12/14/2021   Smoker within last 12 months 08/02/2020   Female stress incontinence 08/02/2020   Asthma    Morbid obesity ([place])    COVID-19 virus infection 05/2020    PCP: Lin Landsman, MD  REFERRING PROVIDER: Rosemarie Ax, MD  REFERRING DIAG: [phone] (ICD-10-CM) - Sprain of anterior talofibular ligament of left ankle, subsequent encounter  THERAPY DIAG:  Stiffness of left ankle, not elsewhere classified  Pain in left ankle and joints of left foot  Muscle weakness (generalized)  Other abnormalities of gait and mobility  Localized edema  RATIONALE FOR EVALUATION AND TREATMENT: Rehabilitation  ONSET DATE: 12/09/21  NEXT MD VISIT: 02/14/22   SUBJECTIVE:   SUBJECTIVE STATEMENT:  The only time it hurts is if I move it to far in or if I'm on it for a really long time.  PAIN:  Are you  having pain? No  PERTINENT HISTORY: Asthma, chronic back pain, obesity  PRECAUTIONS: None  WEIGHT BEARING RESTRICTIONS No  FALLS:  Has patient fallen in last 6 months? Yes. 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 Not limited with standing though may be painful.  PLOF: Independent  PATIENT GOALS full ROM and minimal pain   OBJECTIVE:   DIAGNOSTIC FINDINGS:  XR: 12/10/21: Lateral soft tissue swelling is seen. No acute fracture or dislocation is noted  PATIENT SURVEYS:  LEFS 62/80  EDEMA:  Mild swelling compared to Rt  MUSCLE LENGTH:  Tight gastroc bil, ant tib L tight, peroneals tight left  POSTURE:  weight shift right  PALPATION: Tender just distal to bil malleoli  LOWER EXTREMITY ROM:   AROM Right eval Left AROM eval Left AROM 02/08/22  Ankle dorsiflexion -4/5 A/P neutral 3  Ankle plantarflexion 67 47 55  Ankle inversion _0 Ankle eversion 35 20 40     PROM Left PROM eval  Ankle dorsiflexion +10  Ankle plantarflexion 56  Ankle inversion 30  Ankle eversion 38   (Blank rows = not tested)  LOWER EXTREMITY MMT:  MMT Right eval Left eval left 02/08/22  Hip flexion  5   Hip extension     Hip abduction     Hip adduction     Hip internal rotation     Hip external rotation     Knee flexion  5   Knee extension  5   Ankle dorsiflexion  5 5  Ankle plantarflexion  4+ sitting Still limited with SL heel raise  Ankle inversion  5 5  Ankle eversion  4+ 5 mild pain   (Blank rows = not tested)   FUNCTIONAL TESTS:  SLS 4 sec and unstable SLS R 11 seconds 02/08/22   GAIT: Distance walked: 30 Assistive device utilized: None Level of assistance: Complete Independence Comments: short stride length bil    TODAY'S  TREATMENT:  02/08/22 THERAPEUTIC EXERCISE: to improve flexibility, strength and mobility.  Verbal and tactile cues throughout for technique. Elliptical L2.0 x 5 min Gastroc stretch 2x 30 sec L L SLS + 5-way reach to colored dots x 10 reps, intermittent 1 pole A for balance - cues to avoid knee hyperextension on stance leg L SLS eccentric step-downs from 6" step 2 x 10; 1 pole A for balance B concentric heel raise with L eccentric emphasis 2 x 10 eated GTB L ankle 4-way (PF/DF/IV/EV) x 20 each Long-sitting GTB PNF diagonals x 20 each   02/05/22 THERAPEUTIC EXERCISE: to improve flexibility, strength and mobility.  Verbal and tactile cues throughout for technique. Elliptical L2.0 x 5 min L SLS + 5-way reach to colored dots x 10 reps, intermittent 1 pole A for balance - cues to avoid knee hyperextension on stance leg L SLS eccentric step-downs from 6" step 2 x 10; 1 pole A for balance Seated GTB L ankle 4-way (PF/DF/IV/EV) x 20 each Long-sitting GTB PNF diagonals x 20 each L/R SLS + R/L GTB 4-way hip x 10, 1 pole A for balance - cues to avoid knee hyperextension on stance leg B concentric heel raise with L eccentric emphasis 2 x 10   01/29/22 Elliptical L 1.5 x 5 min Gastroc stretch 2x30 sec Seated ant tib and peroneal stretches 2x30 sec ea  Circles CW/CCW x 10 ea Alphabet x 1 PNF diagonals x 10 ea Heel raises x 10 Seated RTB inv, ever, DF x 20 ea. SLS multiple reps L and vectors  Step ups on foam x 10 fwd and lateral SLS on foam L multiple reps   01/25/22 Eval only   PATIENT EDUCATION:  Education details: HEP clarification Person educated: Patient Education method: Customer service manager Education comprehension: verbalized understanding and returned demonstration   HOME EXERCISE PROGRAM: Access Code: [email or web address] URL: [email or web address] Date: 01/29/2022 Prepared by: Almyra Free  Exercises - Long Sitting Ankle Eversion with Resistance  - 1 x daily - 7 x  weekly - 3 sets - 10 reps - Long Sitting Ankle Inversion with Resistance  - 1 x daily - 7 x weekly - 3 sets - 10 reps - Seated Ankle Dorsiflexion with Resistance  - 1 x daily - 4 x weekly - 2 sets - 10 reps - Long Sitting Ankle PNF D1 Dorsiflexion with Resistance  - 1 x daily - 7 x weekly - 3 sets - 10 reps - Long Sitting Ankle PNF D2 Dorsiflexion with Resistance  - 1 x daily - 7 x weekly - 3 sets - 10 reps - Standing  Heel Raise  - 2 x daily - 7 x weekly - 1-2 sets - 10 reps - Seated Ankle Inversion Eversion PROM  - 2 x daily - 7 x weekly - 1 sets - 3 reps - 30 sec hold  ASSESSMENT:  CLINICAL IMPRESSION: Lilia is progressing well toward her LTGs. Her DF is still limited and noticeable with descending stairs where she feels some pain. She is also limited with left PF strength. Balance has improved signficantly, but she is still challenged with vectors. She will continue to benefit from skilled PT to address her ongoing deficits.  OBJECTIVE IMPAIRMENTS Abnormal gait, decreased balance, decreased ROM, decreased strength, increased edema, impaired flexibility, postural dysfunction, obesity, and pain.   ACTIVITY LIMITATIONS  not limited but painful  PARTICIPATION LIMITATIONS:  not limited but painful  PERSONAL FACTORS 3+ comorbidities: Asthma, chronic back pain, obesity  are also affecting patient's functional outcome.   REHAB POTENTIAL: Excellent  CLINICAL DECISION MAKING: Stable/uncomplicated  EVALUATION COMPLEXITY: Low   GOALS: Goals reviewed with patient? Yes  SHORT TERM GOALS: Target date: 02/08/2022   Patient will be independent with initial HEP. Baseline: no HEP Goal status: MET    LONG TERM GOALS: Target date: 03/08/2022 (Remove Blue Hyperlink)  Patient will be independent with advanced/ongoing HEP to improve outcomes and carryover.  Baseline: no HEP Goal status: IN PROGRESS  2.  Patient will report at least 75% improvement in left ankle/foot pain with standing and  walking to improve QOL. Baseline: 70% improvement 02/08/22 Goal status: IN PROGRESS  3.  Patient will demonstrate improved left ankle AROM to WNL to allow for normal gait and stair mechanics. Baseline:  Goal status: IN PROGRESS  4.  Patient will demonstrate improved functional LE strength as demonstrated by ability to balance without difficulty on the left LE. Baseline: SLS = 11 sec on L LE 02/08/22 Goal status: IN PROGRESS  5.  Patient will be able to ascend/descend stairs with 1 HR and reciprocal step pattern safely to access home and community.  Baseline: mild pain with descent 02/08/22 Goal status: IN PROGRESS  7.  Patient will report 72 or better on LEFS to demonstrate improved functional ability. Baseline: 62/80 Goal status: IN PROGRESS   PLAN: PT FREQUENCY: 2x/week  PT DURATION: 6 weeks  PLANNED INTERVENTIONS: Therapeutic exercises, Therapeutic activity, Neuromuscular re-education, Balance training, Gait training, Patient/Family education, Self Care, Joint mobilization, Stair training, Dry Needling, Cryotherapy, Moist heat, Taping, Manual therapy, and Re-evaluation  PLAN FOR NEXT SESSION:  MD appt on 02/14/22 (note already completed); ankle DF ROM and PF strength, gait/balance, progress HEP as indicated   Tamaira Ciriello, PT 02/08/2022, 12:09 PM

## 2022-02-08 ENCOUNTER — Encounter: Payer: Self-pay | Admitting: Physical Therapy

## 2022-02-08 ENCOUNTER — Ambulatory Visit: Payer: Medicaid Other | Admitting: Physical Therapy

## 2022-02-08 DIAGNOSIS — R600 Localized edema: Secondary | ICD-10-CM

## 2022-02-08 DIAGNOSIS — M6281 Muscle weakness (generalized): Secondary | ICD-10-CM

## 2022-02-08 DIAGNOSIS — M25672 Stiffness of left ankle, not elsewhere classified: Secondary | ICD-10-CM

## 2022-02-08 DIAGNOSIS — R2689 Other abnormalities of gait and mobility: Secondary | ICD-10-CM

## 2022-02-08 DIAGNOSIS — M25572 Pain in left ankle and joints of left foot: Secondary | ICD-10-CM

## 2022-02-13 ENCOUNTER — Encounter: Payer: Medicaid Other | Admitting: Physical Therapy

## 2022-02-14 ENCOUNTER — Ambulatory Visit (INDEPENDENT_AMBULATORY_CARE_PROVIDER_SITE_OTHER): Payer: Medicaid Other | Admitting: Family Medicine

## 2022-02-14 ENCOUNTER — Encounter: Payer: Self-pay | Admitting: Physical Therapy

## 2022-02-14 ENCOUNTER — Encounter: Payer: Self-pay | Admitting: Family Medicine

## 2022-02-14 ENCOUNTER — Ambulatory Visit: Payer: Medicaid Other | Attending: Family Medicine | Admitting: Physical Therapy

## 2022-02-14 VITALS — BP 110/78 | Ht 62.25 in | Wt 200.0 lb

## 2022-02-14 DIAGNOSIS — S93492D Sprain of other ligament of left ankle, subsequent encounter: Secondary | ICD-10-CM | POA: Diagnosis not present

## 2022-02-14 DIAGNOSIS — M25672 Stiffness of left ankle, not elsewhere classified: Secondary | ICD-10-CM | POA: Insufficient documentation

## 2022-02-14 DIAGNOSIS — R600 Localized edema: Secondary | ICD-10-CM | POA: Insufficient documentation

## 2022-02-14 DIAGNOSIS — R2689 Other abnormalities of gait and mobility: Secondary | ICD-10-CM | POA: Diagnosis present

## 2022-02-14 DIAGNOSIS — M6281 Muscle weakness (generalized): Secondary | ICD-10-CM | POA: Diagnosis present

## 2022-02-14 DIAGNOSIS — M25572 Pain in left ankle and joints of left foot: Secondary | ICD-10-CM | POA: Insufficient documentation

## 2022-02-14 NOTE — Assessment & Plan Note (Signed)
Initial injury occurred at the beginning of July.  Continues to note improvement with physical therapy. -Counseled on home exercise therapy and supportive care. -Continue physical therapy. -Could consider further imaging or injection.

## 2022-02-14 NOTE — Patient Instructions (Signed)
Good to see you Please continue physical therapy and the exercises   Please send me a message in MyChart with any questions or updates.  Please see me back as needed.   --Dr. Jordan Likes

## 2022-02-14 NOTE — Progress Notes (Signed)
  Cassandra Harrison - 31 y.o. female MRN [id number]  Date of birth: Nov 17, 1990  SUBJECTIVE:  Including CC & ROS.  No chief complaint on file.   Cassandra Harrison is a 31 y.o. female that is following up for left ankle pain.  She has been doing well with physical therapy.  Only notices pain with going down stairs.  Does have some swelling intermittently.   Review of Systems See HPI   HISTORY: Past Medical, Surgical, Social, and Family History Reviewed & Updated per EMR.   Pertinent Historical Findings include:  Past Medical History:  Diagnosis Date   Asthma    last used 11/29/11   Bulging disc    Chronic back pain    Complication of anesthesia    itching with epidural   Constipation    COVID-19 virus infection 05/2020   Migraine    Morbid obesity ([facility])     Past Surgical History:  Procedure Laterality Date   WISDOM TOOTH EXTRACTION       PHYSICAL EXAM:  VS: BP 110/78 (BP Location: Right Arm, Patient Position: Sitting)   Ht 5' 2.25" (1.581 m)   Wt 200 lb (90.7 kg)   BMI 36.29 kg/m  Physical Exam Gen: NAD, alert, cooperative with exam, well-appearing MSK:  Neurovascularly intact       ASSESSMENT & PLAN:   Sprain of anterior talofibular ligament of left ankle Initial injury occurred at the beginning of July.  Continues to note improvement with physical therapy. -Counseled on home exercise therapy and supportive care. -Continue physical therapy. -Could consider further imaging or injection.

## 2022-02-14 NOTE — Therapy (Signed)
OUTPATIENT PHYSICAL THERAPY TREATMENT NOTE   Patient Name: Cassandra Harrison MRN: [id number] DOB:[birth year], 31 y.o., female Today's Date: 02/14/2022   PT End of Session - 02/14/22 0935     Visit Number 5    Number of Visits 12    Date for PT Re-Evaluation 03/12/22    Authorization Type [place] [employer]    Authorization Time Period 01/29/22 - 03/30/22    Authorization - Visit Number 4    Authorization - Number of Visits 4    PT Start Time 0935    PT Stop Time 1019    PT Time Calculation (min) 44 min    Activity Tolerance Patient tolerated treatment well    Behavior During Therapy [facility] for tasks assessed/performed                 Past Medical History:  Diagnosis Date   Asthma    last used 11/29/11   Bulging disc    Chronic back pain    Complication of anesthesia    itching with epidural   Constipation    COVID-19 virus infection 05/2020   Migraine    Morbid obesity ([place])    Past Surgical History:  Procedure Laterality Date   WISDOM TOOTH EXTRACTION     Patient Active Problem List   Diagnosis Date Noted   Sprain of anterior talofibular ligament of left ankle 12/14/2021   Smoker within last 12 months 08/02/2020   Female stress incontinence 08/02/2020   Asthma    Morbid obesity ([place])    COVID-19 virus infection 05/2020    PCP: Lin Landsman, MD  REFERRING PROVIDER: Rosemarie Ax, MD  REFERRING DIAG: [phone] (ICD-10-CM) - Sprain of anterior talofibular ligament of left ankle, subsequent encounter  THERAPY DIAG:  Stiffness of left ankle, not elsewhere classified  Pain in left ankle and joints of left foot  Muscle weakness (generalized)  Other abnormalities of gait and mobility  Localized edema  RATIONALE FOR EVALUATION AND TREATMENT: Rehabilitation  ONSET DATE: 12/09/21  NEXT MD VISIT: 02/14/22   SUBJECTIVE:   SUBJECTIVE STATEMENT:  Pt reports she has been able to do most activities w/o pain but does note some continued difficulty with running  after her child or going up/down stairs quickly.  PAIN:  Are you having pain? No  PERTINENT HISTORY: Asthma, chronic back pain, obesity  PRECAUTIONS: None  WEIGHT BEARING RESTRICTIONS No  FALLS:  Has patient fallen in last 6 months? Yes. 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 Not limited with standing though may be painful.  PLOF: Independent  PATIENT GOALS full ROM and minimal pain   OBJECTIVE:   DIAGNOSTIC FINDINGS:  XR: 12/10/21: Lateral soft tissue swelling is seen. No acute fracture or dislocation is noted  PATIENT SURVEYS:  LEFS 62/80  EDEMA:  Mild swelling compared to Rt  MUSCLE LENGTH:  Tight gastroc bil, ant tib L tight, peroneals tight left  POSTURE:  weight shift right  PALPATION: Tender just distal to bil malleoli  LOWER EXTREMITY ROM:   AROM Right eval Left AROM eval Left AROM 02/08/22  Ankle dorsiflexion -4/5 A/P neutral 3  Ankle plantarflexion 67 47 55  Ankle inversion $RemoveBeforeD'30 20 23  '[id number]$ Ankle eversion 35 20 40     PROM Left PROM eval  Ankle  dorsiflexion +10  Ankle plantarflexion 56  Ankle inversion 30  Ankle eversion 38   (Blank rows = not tested)  LOWER EXTREMITY MMT:  MMT Right eval Left eval left 02/08/22  Hip flexion  5   Hip extension     Hip abduction     Hip adduction     Hip internal rotation     Hip external rotation     Knee flexion  5   Knee extension  5   Ankle dorsiflexion  5 5  Ankle plantarflexion  4+ sitting Still limited with SL heel raise  Ankle inversion  5 5  Ankle eversion  4+ 5 mild pain   (Blank rows = not tested)   FUNCTIONAL TESTS:  SLS 4 sec and unstable SLS R 11 seconds 02/08/22   GAIT: Distance walked: 30 Assistive device utilized: None Level of assistance: Complete  Independence Comments: short stride length bil    TODAY'S TREATMENT:  02/14/22 THERAPEUTIC EXERCISE: to improve flexibility, strength and mobility.  Verbal and tactile cues throughout for technique. NuStep - L5 x 6 min (LE only) Long-sitting GTB PNF diagonals x 20 each L/R SLS + R/L GTB 4-way hip x 10, 1 pole A for balance - cues to avoid knee hyperextension on stance leg L SLS eccentric lateral step-downs from 6" step 2 x 10; 1 pole A for balance L SLS eccentric forward step-downs from 4" step 2 x 10; 1 pole A for balance - cues to avoid excessive fwd motion of knee past foot   02/08/22 THERAPEUTIC EXERCISE: to improve flexibility, strength and mobility.  Verbal and tactile cues throughout for technique. Elliptical L2.0 x 5 min Gastroc stretch 2x 30 sec L L SLS + 5-way reach to colored dots x 10 reps, intermittent 1 pole A for balance - cues to avoid knee hyperextension on stance leg L SLS eccentric step-downs from 6" step 2 x 10; 1 pole A for balance B concentric heel raise with L eccentric emphasis 2 x 10 Seated GTB L ankle 4-way (PF/DF/IV/EV) x 20 each Long-sitting GTB PNF diagonals x 20 each   02/05/22 THERAPEUTIC EXERCISE: to improve flexibility, strength and mobility.  Verbal and tactile cues throughout for technique. Elliptical L2.0 x 5 min L SLS + 5-way reach to colored dots x 10 reps, intermittent 1 pole A for balance - cues to avoid knee hyperextension on stance leg L SLS eccentric step-downs from 6" step 2 x 10; 1 pole A for balance Seated GTB L ankle 4-way (PF/DF/IV/EV) x 20 each Long-sitting GTB PNF diagonals x 20 each L/R SLS + R/L GTB 4-way hip x 10, 1 pole A for balance - cues to avoid knee hyperextension on stance leg B concentric heel raise with L eccentric emphasis 2 x 10   PATIENT EDUCATION:  Education details: HEP clarification Person educated: Patient Education method: Customer service manager Education comprehension: verbalized understanding and  returned demonstration   HOME EXERCISE PROGRAM: Access Code: [email or web address] URL: [email or web address] Date: 01/29/2022 Prepared by: Almyra Free  Exercises - Long Sitting Ankle Eversion with Resistance  - 1 x daily - 7 x weekly - 3 sets - 10 reps - Long Sitting Ankle Inversion with Resistance  - 1 x daily - 7 x weekly - 3 sets - 10 reps - Seated Ankle Dorsiflexion with Resistance  - 1 x daily - 4 x weekly - 2 sets - 10 reps - Long Sitting Ankle PNF D1 Dorsiflexion with Resistance  - 1 x daily - 7 x weekly -  3 sets - 10 reps - Long Sitting Ankle PNF D2 Dorsiflexion with Resistance  - 1 x daily - 7 x weekly - 3 sets - 10 reps - Standing Heel Raise  - 2 x daily - 7 x weekly - 1-2 sets - 10 reps - Seated Ankle Inversion Eversion PROM  - 2 x daily - 7 x weekly - 1 sets - 3 reps - 30 sec hold  ASSESSMENT:  CLINICAL IMPRESSION: Pasha reports pain has become less frequent, typically bothering her only with extremes of ROM or after she has been on her feet for long periods. She continues to note some difficulty with stair negotiation as well as when she has to run to keep up with her toddler age daughter. Therapeutic exercises and activities targeting strengthening through full ankle ROM and SLS stability as well as functional strengthening with step down motions to improve stair negotiation. She remains limited by fatigue and requires continued cues for alignment and to avoid genu recurvatum on stance leg during SLS activities. Raynelle will benefit from continued skilled PT to restore functional L ankle ROM, strength and balance/proprioception to allow her to work a full shift w/o limitations and allow her to keep up with her young children.  OBJECTIVE IMPAIRMENTS Abnormal gait, decreased balance, decreased ROM, decreased strength, increased edema, impaired flexibility, postural dysfunction, obesity, and pain.   ACTIVITY LIMITATIONS  not limited but painful  PARTICIPATION LIMITATIONS:  not  limited but painful  PERSONAL FACTORS 3+ comorbidities: Asthma, chronic back pain, obesity  are also affecting patient's functional outcome.   REHAB POTENTIAL: Excellent  CLINICAL DECISION MAKING: Stable/uncomplicated  EVALUATION COMPLEXITY: Low   GOALS: Goals reviewed with patient? Yes  SHORT TERM GOALS: Target date: 02/08/2022   Patient will be independent with initial HEP. Baseline: no HEP Goal status: MET    LONG TERM GOALS: Target date: 03/08/2022 (Remove Blue Hyperlink)  Patient will be independent with advanced/ongoing HEP to improve outcomes and carryover.  Baseline: no HEP Goal status: IN PROGRESS  2.  Patient will report at least 75% improvement in left ankle/foot pain with standing and walking to improve QOL. Baseline: 70% improvement 02/08/22 Goal status: IN PROGRESS  3.  Patient will demonstrate improved left ankle AROM to WNL to allow for normal gait and stair mechanics. Baseline:  Goal status: IN PROGRESS  4.  Patient will demonstrate improved functional LE strength as demonstrated by ability to balance without difficulty on the left LE. Baseline: SLS = 11 sec on L LE 02/08/22 Goal status: IN PROGRESS  5.  Patient will be able to ascend/descend stairs with 1 HR and reciprocal step pattern safely to access home and community.  Baseline: mild pain with descent 02/08/22 Goal status: IN PROGRESS  7.  Patient will report 72 or better on LEFS to demonstrate improved functional ability. Baseline: 62/80 Goal status: IN PROGRESS   PLAN: PT FREQUENCY: 2x/week  PT DURATION: 6 weeks  PLANNED INTERVENTIONS: Therapeutic exercises, Therapeutic activity, Neuromuscular re-education, Balance training, Gait training, Patient/Family education, Self Care, Joint mobilization, Stair training, Dry Needling, Cryotherapy, Moist heat, Taping, Manual therapy, and Re-evaluation  PLAN FOR NEXT SESSION:  ankle DF ROM and PF strength, gait/balance, progress HEP as  indicated   Percival Spanish, PT 02/14/2022, 10:39 AM

## 2022-02-15 ENCOUNTER — Encounter: Payer: Medicaid Other | Admitting: Physical Therapy

## 2022-02-15 IMAGING — US US MFM OB FOLLOW-UP
1 series · 14 of 28 positions shown · non-contrast
Comparison: none

[Series 1: us mfm ob follow-up · 37 acquisitions, 14 frames shown]
[im 2/37]
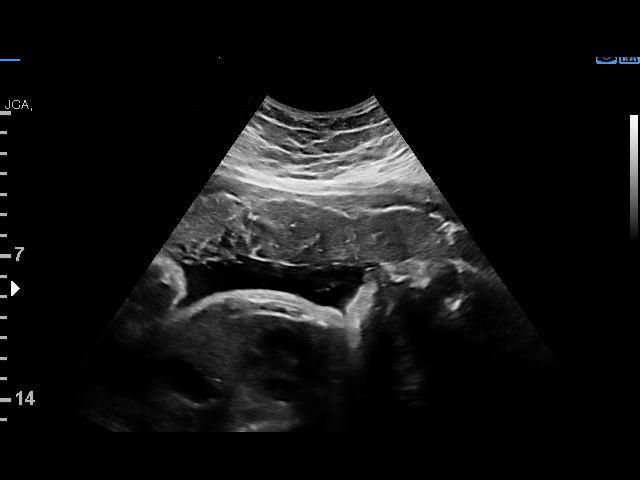
[im 5/37]
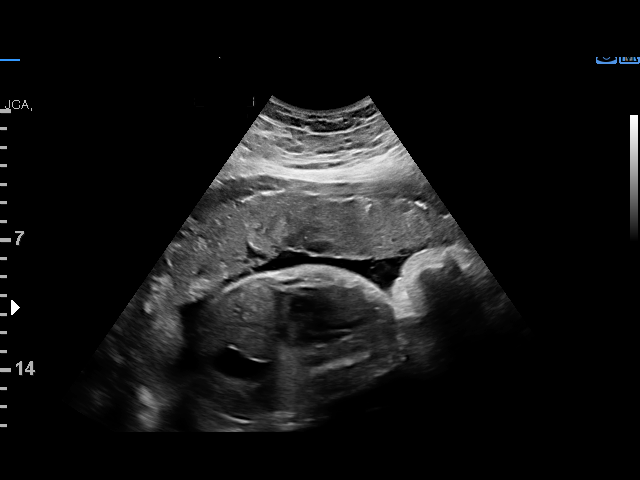
[im 7/37]
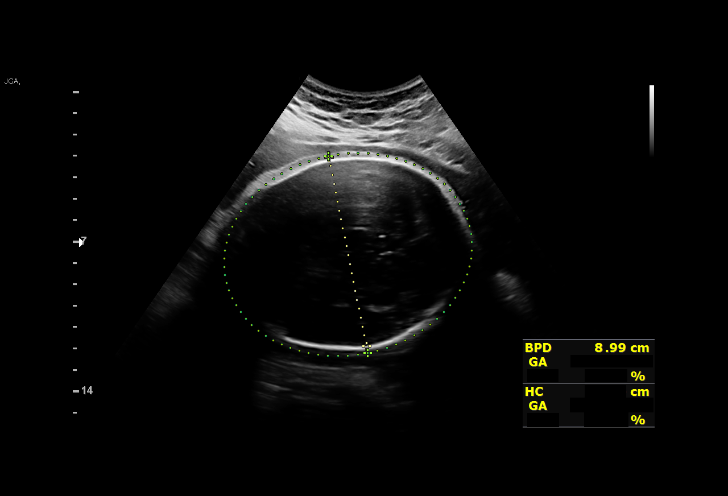
[im 10/37]
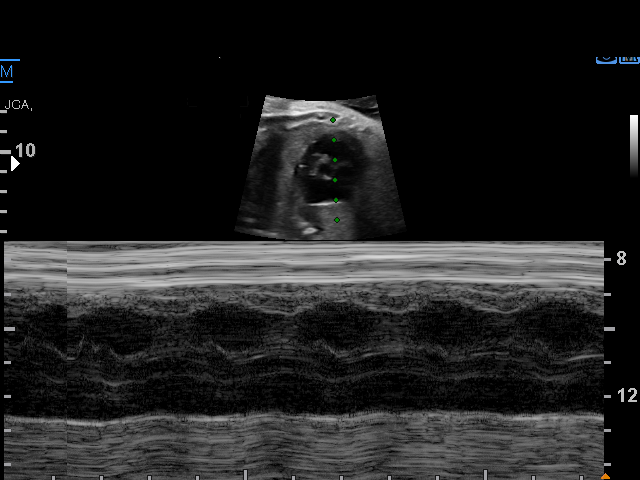
[im 13/37]
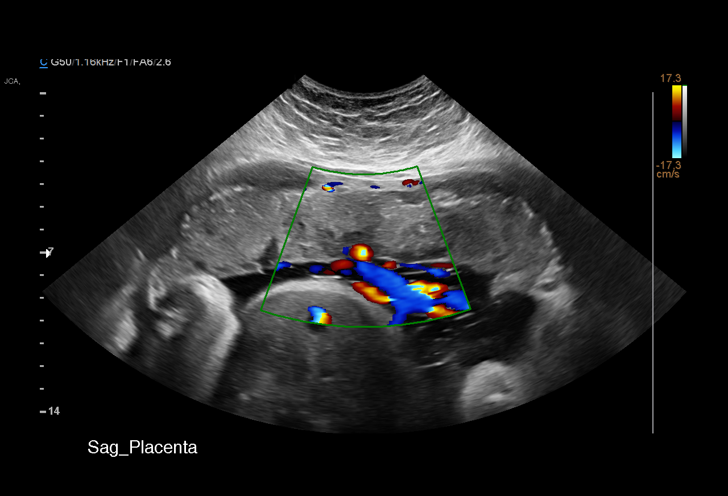
[im 15/37]
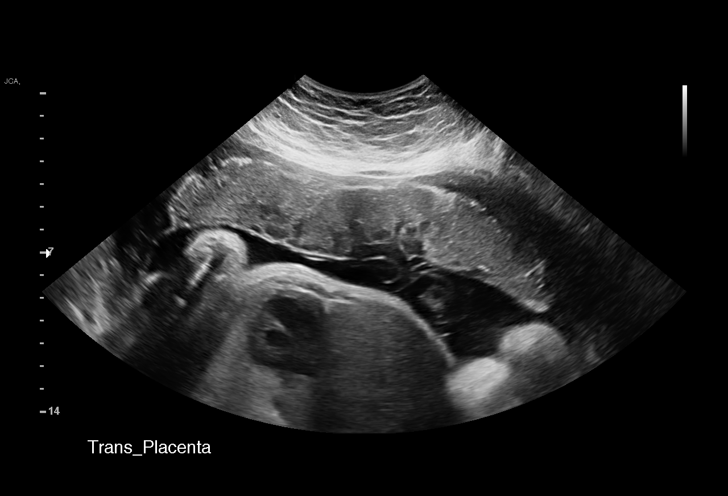
[im 18/37]
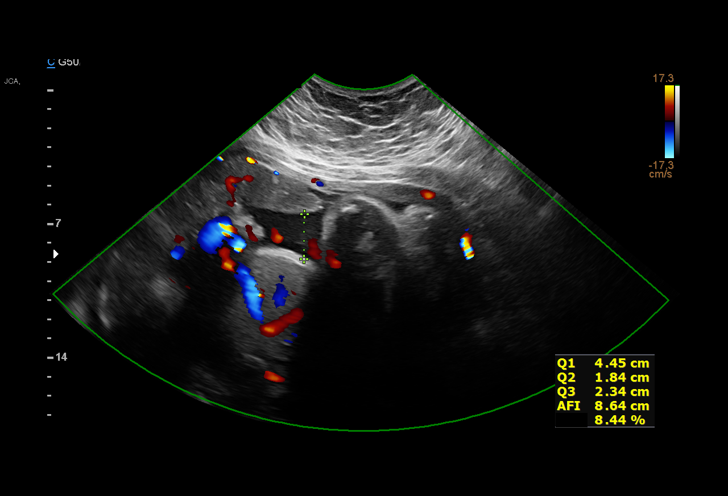
[im 21/37]
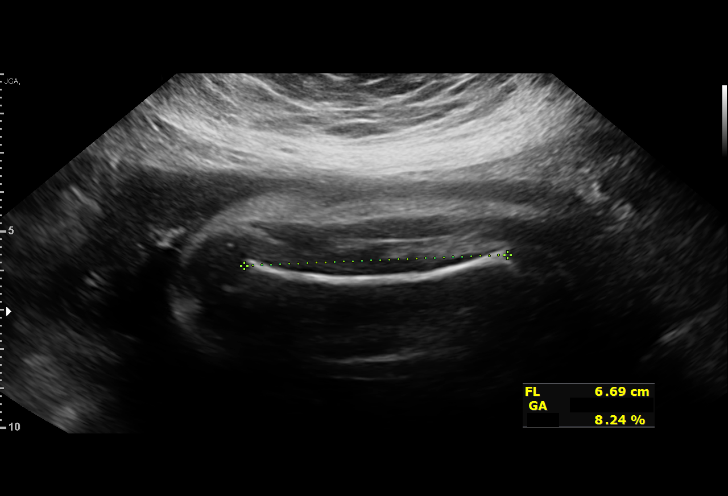
[im 23/37]
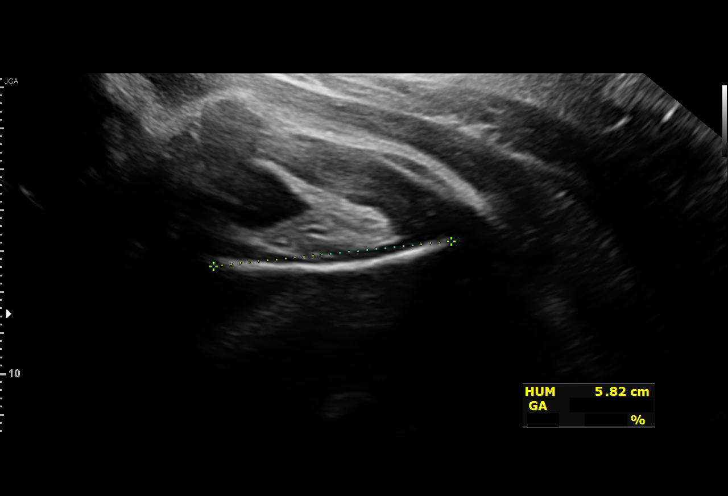
[im 26/37]
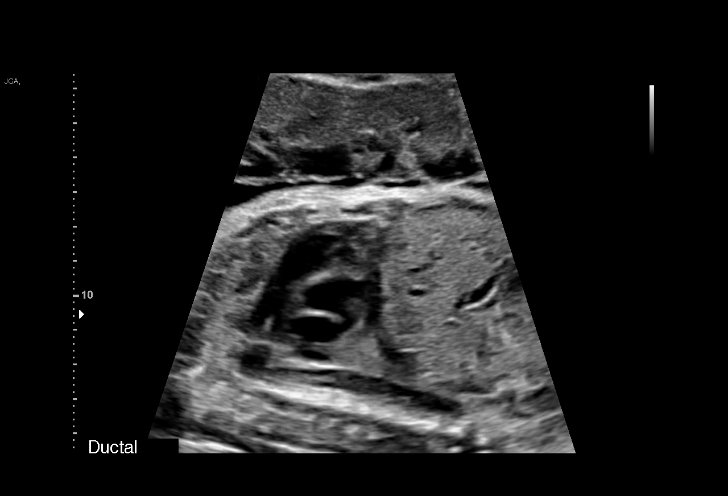
[im 29/37]
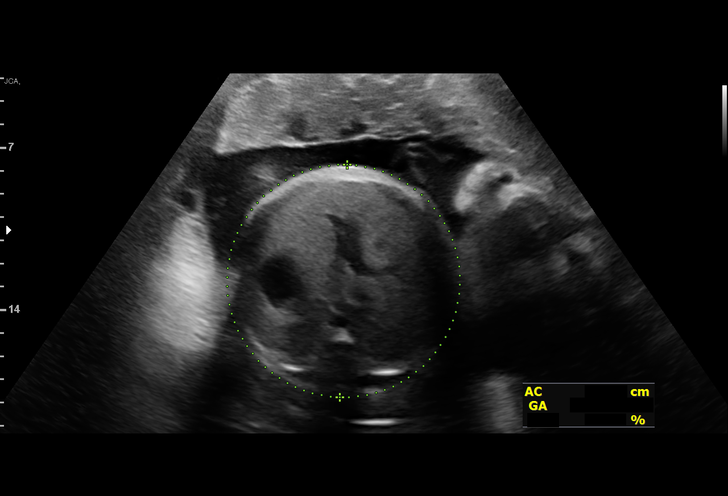
[im 31/37]
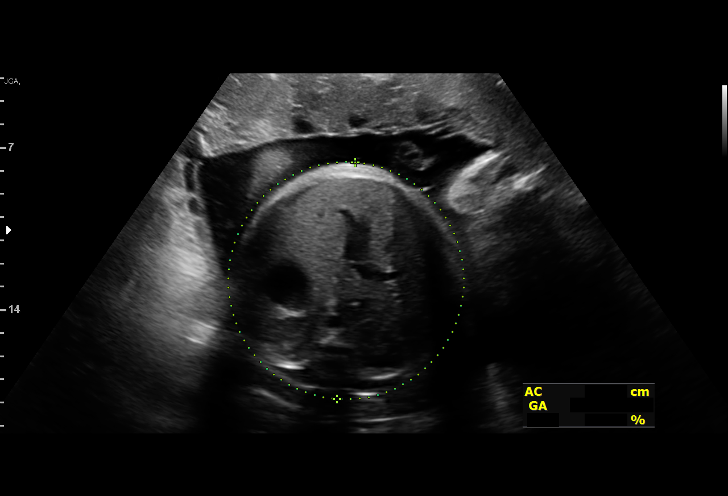
[im 34/37]
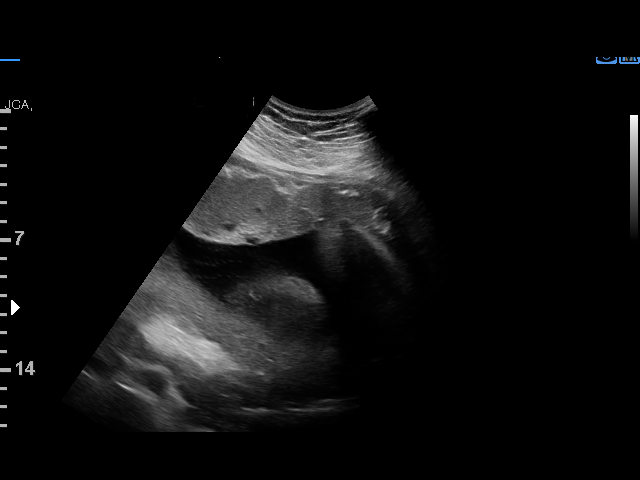
[im 37/37]
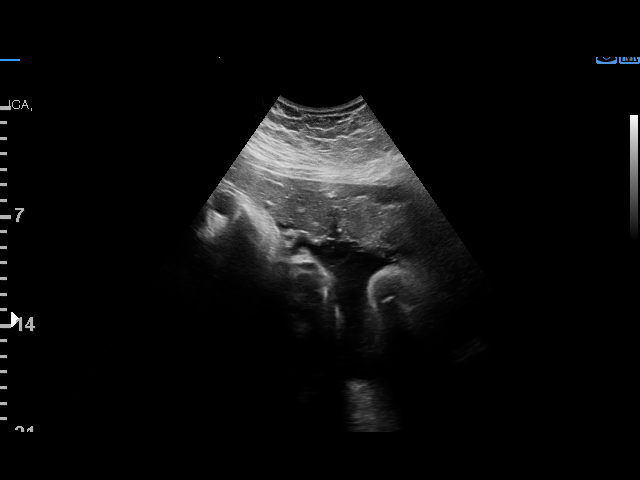

[14 of 28 positions shown; findings below may reference images not displayed]

Indications

 Obesity complicating pregnancy, third
 trimester
 Encounter for other antenatal screening
 follow-up  (Low Risk NIPS)
 Smoking complicating pregnancy, third
 trimester
 Asthma                                          XFF.0F [id number]
 Rh negative state in antepartum
 36 weeks gestation of pregnancy
Fetal Evaluation

 Num Of Fetuses:          1
 Fetal Heart Rate(bpm):   137
 Cardiac Activity:        Observed
 Presentation:            Cephalic
 Placenta:                Anterior Fundal
 P. Cord Insertion:       Visualized

 AFI Sum(cm)     %Tile       Largest Pocket(cm)
 12.6            42

 RUQ(cm)       RLQ(cm)       LUQ(cm)        LLQ(cm)

Biometry
 BPD:      90.3  mm     G. Age:  36w 4d         69  %    CI:          75.2  %    70 - 86
                                                         FL/HC:       20.2  %    20.1 -
 HC:      330.3  mm     G. Age:  37w 4d         53  %    HC/AC:       1.04       0.93 -
 AC:      317.9  mm     G. Age:  35w 5d         44  %    FL/BPD:      73.8  %    71 - 87
 FL:       66.6  mm     G. Age:  34w 2d          7  %    FL/AC:       20.9  %    20 - 24
 HUM:      57.7  mm     G. Age:  33w 3d         16  %

 Est. FW:    8083   gm          6 lb     34  %
OB History

 Gravidity:    2         Term:   1
 Living:       1
Gestational Age

 LMP:           35w 0d        Date:  09/21/19                 EDD:   06/27/20
 U/S Today:     36w 0d                                        EDD:   06/20/20
 Best:          36w 2d     Det. By:  U/S  (02/01/20)          EDD:   06/18/20
Anatomy

 Cranium:               Previously seen        LVOT:                   Previously seen
 Cavum:                 Previously seen        Aortic Arch:            Previously seen
 Ventricles:            Previously seen        Ductal Arch:            Appears normal
 Choroid Plexus:        Previously seen        Diaphragm:              Appears normal
 Cerebellum:            Previously seen        Stomach:                Appears normal, left
                                                                       sided
 Posterior Fossa:       Previously seen        Abdomen:                Appears normal
 Nuchal Fold:           Not applicable (>20    Abdominal Wall:         Previously seen
                        wks GA)
 Face:                  Orbits and profile     Cord Vessels:           Previously seen
                        previously seen
 Lips:                  Previously seen        Kidneys:                Previously seen
 Palate:                Previously seen        Bladder:                Previously seen
 Thoracic:              Previously seen        Spine:                  Previously seen
 Heart:                 Previously seen        Upper Extremities:      Previously seen
 RVOT:                  Previously seen        Lower Extremities:      Previously seen

 Other:  Other anatomy previously imaged and appeared normal. Fetus
         appears to be female. Nasal bone prev visualized. 3VV and 3VTV
         prev visualized. IVC/SVC prev seen.
Impression

 Follow up growth for elevated maternal BMI
 Normal interval growth with measurements consistent with
 dates
 Good fetal movement and amniotic fluid volume
Recommendations

 Follow up as clinically indicated.

## 2022-02-16 ENCOUNTER — Ambulatory Visit: Payer: Medicaid Other | Admitting: Physical Therapy

## 2022-02-16 ENCOUNTER — Encounter: Payer: Self-pay | Admitting: Physical Therapy

## 2022-02-16 DIAGNOSIS — M25672 Stiffness of left ankle, not elsewhere classified: Secondary | ICD-10-CM | POA: Diagnosis not present

## 2022-02-16 DIAGNOSIS — R2689 Other abnormalities of gait and mobility: Secondary | ICD-10-CM

## 2022-02-16 DIAGNOSIS — M25572 Pain in left ankle and joints of left foot: Secondary | ICD-10-CM

## 2022-02-16 DIAGNOSIS — R600 Localized edema: Secondary | ICD-10-CM

## 2022-02-16 DIAGNOSIS — M6281 Muscle weakness (generalized): Secondary | ICD-10-CM

## 2022-02-16 NOTE — Therapy (Signed)
OUTPATIENT PHYSICAL THERAPY TREATMENT NOTE   Patient Name: Cassandra Harrison MRN: [id number] DOB:Sep 17, 1990, 31 y.o., female Today's Date: 02/16/2022   PT End of Session - 02/16/22 0938     Visit Number 6    Number of Visits 12    Date for PT Re-Evaluation 03/12/22    Authorization Type [place] [employer]    Authorization Time Period 01/29/22 - 03/30/22    Authorization - Visit Number --   in review   Authorization - Number of Visits --   pending   PT Start Time 0938    PT Stop Time 1019    PT Time Calculation (min) 41 min    Activity Tolerance Patient tolerated treatment well    Behavior During Therapy [facility] for tasks assessed/performed                  Past Medical History:  Diagnosis Date   Asthma    last used 11/29/11   Bulging disc    Chronic back pain    Complication of anesthesia    itching with epidural   Constipation    COVID-19 virus infection 05/2020   Migraine    Morbid obesity ([place])    Past Surgical History:  Procedure Laterality Date   WISDOM TOOTH EXTRACTION     Patient Active Problem List   Diagnosis Date Noted   Sprain of anterior talofibular ligament of left ankle 12/14/2021   Smoker within last 12 months 08/02/2020   Female stress incontinence 08/02/2020   Asthma    Morbid obesity ([place])    COVID-19 virus infection 05/2020    PCP: Lin Landsman, MD  REFERRING PROVIDER: Rosemarie Ax, MD  REFERRING DIAG: [phone] (ICD-10-CM) - Sprain of anterior talofibular ligament of left ankle, subsequent encounter  THERAPY DIAG:  Stiffness of left ankle, not elsewhere classified  Pain in left ankle and joints of left foot  Muscle weakness (generalized)  Other abnormalities of gait and mobility  Localized edema  RATIONALE FOR EVALUATION AND TREATMENT: Rehabilitation  ONSET DATE: 12/09/21  NEXT MD VISIT: 02/14/22   SUBJECTIVE:   SUBJECTIVE STATEMENT:  Pt reports some muscle soreness following visit but resolved within a few hours. Very  tired today.  PAIN:  Are you having pain? No  PERTINENT HISTORY: Asthma, chronic back pain, obesity  PRECAUTIONS: None  WEIGHT BEARING RESTRICTIONS No  FALLS:  Has patient fallen in last 6 months? Yes. 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 Not limited with standing though may be painful.  PLOF: Independent  PATIENT GOALS full ROM and minimal pain   OBJECTIVE:   DIAGNOSTIC FINDINGS:  XR: 12/10/21: Lateral soft tissue swelling is seen. No acute fracture or dislocation is noted  PATIENT SURVEYS:  LEFS 62/80  EDEMA:  Mild swelling compared to Rt  MUSCLE LENGTH:  Tight gastroc bil, ant tib L tight, peroneals tight left  POSTURE:  weight shift right  PALPATION: Tender just distal to bil malleoli  LOWER EXTREMITY ROM:   AROM Right eval Left AROM eval Left AROM 02/08/22  Ankle dorsiflexion -4/5 A/P neutral 3  Ankle plantarflexion 67 47 55  Ankle inversion $RemoveBeforeD'30 20 23  '[id number]$ Ankle eversion 35 20 40     PROM Left PROM eval  Ankle dorsiflexion +10  Ankle plantarflexion 56  Ankle inversion 30  Ankle eversion 38   (Blank rows = not tested)  LOWER EXTREMITY MMT:  MMT Right eval Left eval left 02/08/22  Hip flexion  5   Hip extension     Hip abduction     Hip adduction     Hip internal rotation     Hip external rotation     Knee flexion  5   Knee extension  5   Ankle dorsiflexion  5 5  Ankle plantarflexion  4+ sitting Still limited with SL heel raise  Ankle inversion  5 5  Ankle eversion  4+ 5 mild pain   (Blank rows = not tested)   FUNCTIONAL TESTS:  SLS 4 sec and unstable SLS R 11 seconds 02/08/22   GAIT: Distance walked: 30 Assistive device utilized: None Level of assistance: Complete Independence Comments: short stride length  bil    TODAY'S TREATMENT:  02/16/22 THERAPEUTIC EXERCISE: to improve flexibility, strength and mobility.  Verbal and tactile cues throughout for technique. Rec Bike - L3 x 8 min L/R SLS + R/L GTB 4-way hip x 10, 1 pole A for balance - cues to avoid knee hyperextension on stance leg R/L lunge x 10 each, single UE support on counter TRX R/L lateral lunge x 10 each  NEUROMUSCULAR RE-EDUCATION: To improve balance, proprioception, and coordination. Tandem gait fwd & back along foam balance beam x 4 passes B side-stepping along foam balance beam x 2 passes for each pattern  - feet centered on beam  - heels centered on beam with level foot  - forefeet centered on beam with level foot   02/14/22 THERAPEUTIC EXERCISE: to improve flexibility, strength and mobility.  Verbal and tactile cues throughout for technique. NuStep - L5 x 6 min (LE only) Long-sitting GTB PNF diagonals x 20 each L/R SLS + R/L GTB 4-way hip x 10, 1 pole A for balance - cues to avoid knee hyperextension on stance leg L SLS eccentric lateral step-downs from 6" step 2 x 10; 1 pole A for balance L SLS eccentric forward step-downs from 4" step 2 x 10; 1 pole A for balance - cues to avoid excessive fwd motion of knee past foot   02/08/22 THERAPEUTIC EXERCISE: to improve flexibility, strength and mobility.  Verbal and tactile cues throughout for technique. Elliptical L2.0 x 5 min Gastroc stretch 2x 30 sec L L SLS + 5-way reach to colored dots x 10 reps, intermittent 1 pole A for balance - cues to avoid knee hyperextension on stance leg L SLS eccentric step-downs from 6" step 2 x 10; 1 pole A for balance B concentric heel raise with L eccentric emphasis 2 x 10 Seated GTB L ankle 4-way (PF/DF/IV/EV) x 20 each Long-sitting GTB PNF diagonals x 20 each   PATIENT EDUCATION:  Education details: ongoing PT POC Person educated: Patient Education method: Explanation Education comprehension: verbalized understanding   HOME  EXERCISE PROGRAM: Access Code: [email or web address] URL: [email or web address] Date: 02/16/2022 Prepared by: Annie Paras  Exercises - Seated Ankle Circles  - 1 x daily - 7 x weekly - 3 sets - 10 reps - Seated Ankle Alphabet  - 3 x daily - 7 x weekly - 1 sets - 5 reps - Seated Ankle Inversion Eversion PROM  - 2 x daily - 7 x weekly - 1 sets - 3 reps - 30 sec hold - Long Sitting Ankle PNF D1 AROM  - 3 x daily - 7 x weekly - 1 sets - 10 reps -  Long Sitting Ankle PNF D2 AROM   - 3 x daily - 7 x weekly - 1 sets - 10 reps - Standing Gastroc Stretch  - 2 x daily - 7 x weekly - 3 reps - 30s hold - Long Sitting Ankle Eversion with Resistance  - 1 x daily - 3-4 x weekly - 3 sets - 10 reps - Long Sitting Ankle Inversion with Resistance  - 1 x daily - 3-4 x weekly - 3 sets - 10 reps - Seated Ankle Dorsiflexion with Resistance  - 1 x daily - 3-4 x weekly - 2 sets - 10 reps - Long Sitting Ankle PNF D1 Dorsiflexion with Resistance  - 1 x daily - 3-4 x weekly - 3 sets - 10 reps - Long Sitting Ankle PNF D2 Dorsiflexion with Resistance  - 1 x daily - 3-4 x weekly - 3 sets - 10 reps - Standing Heel Raise  - 2 x daily - 7 x weekly - 1-2 sets - 10 reps - Standing Hip Flexion with Anchored Resistance and Chair Support  - 1 x daily - 3 x weekly - 2 sets - 10 reps - 3 sec hold - Standing Hip Adduction with Anchored Resistance  - 1 x daily - 3 x weekly - 2 sets - 10 reps - 3 sec hold - Standing Hip Extension with Anchored Resistance  - 1 x daily - 3 x weekly - 2 sets - 10 reps - 3 sec hold - Standing Hip Abduction with Anchored Resistance  - 1 x daily - 3 x weekly - 2 sets - 10 reps - 3 sec hold   ASSESSMENT:  CLINICAL IMPRESSION: Chimene continues to note impaired balance, therefore continued focus on SLS stability with proximal LE strengthening as well as progressed balance training on uneven surfaces to promote improved stability and decreased fall risk. Foam balance beam challenging with pt requiring  intermittent UE support on counter. Continued intermittent cues necessary for alignment and avoidance of locking knees in hyperextension during strengthening exercises to promote better muscle activation.   OBJECTIVE IMPAIRMENTS Abnormal gait, decreased balance, decreased ROM, decreased strength, increased edema, impaired flexibility, postural dysfunction, obesity, and pain.   ACTIVITY LIMITATIONS  not limited but painful  PARTICIPATION LIMITATIONS:  not limited but painful  PERSONAL FACTORS 3+ comorbidities: Asthma, chronic back pain, obesity  are also affecting patient's functional outcome.   REHAB POTENTIAL: Excellent  CLINICAL DECISION MAKING: Stable/uncomplicated  EVALUATION COMPLEXITY: Low   GOALS: Goals reviewed with patient? Yes  SHORT TERM GOALS: Target date: 02/08/2022   Patient will be independent with initial HEP. Baseline: no HEP Goal status: MET    LONG TERM GOALS: Target date: 03/08/2022 (Remove Blue Hyperlink)  Patient will be independent with advanced/ongoing HEP to improve outcomes and carryover.  Baseline: no HEP Goal status: IN PROGRESS  2.  Patient will report at least 75% improvement in left ankle/foot pain with standing and walking to improve QOL. Baseline: 70% improvement 02/08/22 Goal status: IN PROGRESS  3.  Patient will demonstrate improved left ankle AROM to WNL to allow for normal gait and stair mechanics. Baseline:  Goal status: IN PROGRESS  4.  Patient will demonstrate improved functional LE strength as demonstrated by ability to balance without difficulty on the left LE. Baseline: SLS = 11 sec on L LE 02/08/22 Goal status: IN PROGRESS  5.  Patient will be able to ascend/descend stairs with 1 HR and reciprocal step pattern safely to access home and community.  Baseline:  mild pain with descent 02/08/22 Goal status: IN PROGRESS  7.  Patient will report 72 or better on LEFS to demonstrate improved functional ability. Baseline: 62/80 Goal  status: IN PROGRESS   PLAN: PT FREQUENCY: 2x/week  PT DURATION: 6 weeks  PLANNED INTERVENTIONS: Therapeutic exercises, Therapeutic activity, Neuromuscular re-education, Balance training, Gait training, Patient/Family education, Self Care, Joint mobilization, Stair training, Dry Needling, Cryotherapy, Moist heat, Taping, Manual therapy, and Re-evaluation  PLAN FOR NEXT SESSION:  ankle DF ROM and PF strength, gait/balance, progress HEP as indicated   Percival Spanish, PT 02/16/2022, 2:33 PM

## 2022-02-19 ENCOUNTER — Ambulatory Visit: Payer: Medicaid Other

## 2022-02-22 ENCOUNTER — Ambulatory Visit: Payer: Medicaid Other | Admitting: Physical Therapy

## 2022-02-22 ENCOUNTER — Encounter: Payer: Self-pay | Admitting: Physical Therapy

## 2022-02-22 DIAGNOSIS — M6281 Muscle weakness (generalized): Secondary | ICD-10-CM

## 2022-02-22 DIAGNOSIS — M25672 Stiffness of left ankle, not elsewhere classified: Secondary | ICD-10-CM | POA: Diagnosis not present

## 2022-02-22 DIAGNOSIS — R600 Localized edema: Secondary | ICD-10-CM

## 2022-02-22 DIAGNOSIS — R2689 Other abnormalities of gait and mobility: Secondary | ICD-10-CM

## 2022-02-22 DIAGNOSIS — M25572 Pain in left ankle and joints of left foot: Secondary | ICD-10-CM

## 2022-02-22 NOTE — Therapy (Addendum)
OUTPATIENT PHYSICAL THERAPY TREATMENT NOTE   Patient Name: Cassandra Harrison MRN: [id number] DOB:1990/07/13, 31 y.o., female Today's Date: 02/22/2022   PT End of Session - 02/22/22 0927     Visit Number 7    Number of Visits 12    Date for PT Re-Evaluation 03/12/22    Authorization Type  [employer]    Authorization Time Period 01/29/22 - 03/30/22    Authorization - Visit Number --   in review   Authorization - Number of Visits --   pending   PT Start Time 0927    PT Stop Time 1015    PT Time Calculation (min) 48 min    Activity Tolerance Patient tolerated treatment well    Behavior During Therapy [facility] for tasks assessed/performed                   Past Medical History:  Diagnosis Date   Asthma    last used 11/29/11   Bulging disc    Chronic back pain    Complication of anesthesia    itching with epidural   Constipation    COVID-19 virus infection 05/2020   Migraine    Morbid obesity ([place])    Past Surgical History:  Procedure Laterality Date   WISDOM TOOTH EXTRACTION     Patient Active Problem List   Diagnosis Date Noted   Sprain of anterior talofibular ligament of left ankle 12/14/2021   Smoker within last 12 months 08/02/2020   Female stress incontinence 08/02/2020   Asthma    Morbid obesity ([place])    COVID-19 virus infection 05/2020    PCP: Lin Landsman, MD  REFERRING PROVIDER: Rosemarie Ax, MD  REFERRING DIAG: [phone] (ICD-10-CM) - Sprain of anterior talofibular ligament of left ankle, subsequent encounter  THERAPY DIAG:  Stiffness of left ankle, not elsewhere classified  Pain in left ankle and joints of left foot  Muscle weakness (generalized)  Other abnormalities of gait and mobility  Localized edema  RATIONALE FOR EVALUATION AND TREATMENT: Rehabilitation  ONSET DATE: 12/09/21  NEXT MD VISIT: none scheduled   SUBJECTIVE:   SUBJECTIVE STATEMENT:  Pt reports some delayed onset muscle soreness following last visit but no  increased pain. Better motion and less pain noted with turning ankle.  PAIN:  Are you having pain? No  PERTINENT HISTORY: Asthma, chronic back pain, obesity  PRECAUTIONS: None  WEIGHT BEARING RESTRICTIONS No  FALLS:  Has patient fallen in last 6 months? Yes. 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 Not limited with standing though may be painful.  PLOF: Independent  PATIENT GOALS full ROM and minimal pain   OBJECTIVE:   DIAGNOSTIC FINDINGS:  XR: 12/10/21: Lateral soft tissue swelling is seen. No acute fracture or dislocation is noted  PATIENT SURVEYS:  LEFS 62/80  EDEMA:  Mild swelling compared to Rt  MUSCLE LENGTH:  Tight gastroc bil, ant tib L tight, peroneals tight left  POSTURE:  weight shift right  PALPATION: Tender just distal to bil malleoli  LOWER EXTREMITY ROM:   AROM Right eval Left AROM eval Left AROM 02/08/22  Ankle dorsiflexion -4/5 A/P neutral 3  Ankle plantarflexion 67 47 55  Ankle inversion _0 Ankle eversion 35 20 40     PROM Left PROM  eval  Ankle dorsiflexion +10  Ankle plantarflexion 56  Ankle inversion 30  Ankle eversion 38   (Blank rows = not tested)  LOWER EXTREMITY MMT:  MMT Right eval Left eval left 02/08/22  Hip flexion  5   Hip extension     Hip abduction     Hip adduction     Hip internal rotation     Hip external rotation     Knee flexion  5   Knee extension  5   Ankle dorsiflexion  5 5  Ankle plantarflexion  4+ sitting Still limited with SL heel raise  Ankle inversion  5 5  Ankle eversion  4+ 5 mild pain   (Blank rows = not tested)   FUNCTIONAL TESTS:  SLS 4 sec and unstable SLS R 11 seconds 02/08/22   GAIT: Distance walked: 30 Assistive device utilized: None Level of  assistance: Complete Independence Comments: short stride length bil    TODAY'S TREATMENT:  02/22/22 THERAPEUTIC EXERCISE: to improve flexibility, strength and mobility.  Verbal and tactile cues throughout for technique. NuStep - L6 x 6 min (LE only) L/R SLS eccentric lateral step-downs from 6" step 2 x 10; 1 pole A for balance L/R SLS eccentric forward step-downs from 6" step 2 x 10 on R, 4 x 5 on L; 1 pole A for balance - cues to avoid excessive fwd motion of knee past foot B concentric / L eccentric heel raise 2 x 10; 1 pole A for balance B ankle press (using chest press foot lever) 25# 3 x 10 L SLS on blue foam oval + 4-5 way tap to colored dots x 10, 1 pole A for balance  NEUROMUSCULAR RE-EDUCATION: To improve balance, proprioception, and coordination. Tandem gait fwd & back along foam balance beam x 4 passes B side-stepping along foam balance beam x 2 passes for each pattern  - feet centered on beam  - heels centered on beam with level foot  - forefeet centered on beam with level foot   02/16/22 THERAPEUTIC EXERCISE: to improve flexibility, strength and mobility.  Verbal and tactile cues throughout for technique. Rec Bike - L3 x 8 min L/R SLS + R/L GTB 4-way hip x 10, 1 pole A for balance - cues to avoid knee hyperextension on stance leg R/L lunge x 10 each, single UE support on counter TRX R/L lateral lunge x 10 each  NEUROMUSCULAR RE-EDUCATION: To improve balance, proprioception, and coordination. Tandem gait fwd & back along foam balance beam x 4 passes B side-stepping along foam balance beam x 2 passes for each pattern  - feet centered on beam  - heels centered on beam with level foot  - forefeet centered on beam with level foot   02/14/22 THERAPEUTIC EXERCISE: to improve flexibility, strength and mobility.  Verbal and tactile cues throughout for technique. NuStep - L5 x 6 min (LE only) Long-sitting GTB PNF diagonals x 20 each L/R SLS + R/L GTB 4-way hip x 10, 1 pole A  for balance - cues to avoid knee hyperextension on stance leg L SLS eccentric lateral step-downs from 6" step 2 x 10; 1 pole A for balance L SLS eccentric forward step-downs from 4" step 2 x 10; 1 pole A for balance - cues to avoid excessive fwd motion of knee past foot   02/08/22 THERAPEUTIC EXERCISE: to improve flexibility, strength and mobility.  Verbal and tactile cues throughout for technique. Elliptical L2.0 x 5 min Gastroc stretch 2x 30 sec L  L SLS + 5-way reach to colored dots x 10 reps, intermittent 1 pole A for balance - cues to avoid knee hyperextension on stance leg L SLS eccentric step-downs from 6" step 2 x 10; 1 pole A for balance B concentric heel raise with L eccentric emphasis 2 x 10 Seated GTB L ankle 4-way (PF/DF/IV/EV) x 20 each Long-sitting GTB PNF diagonals x 20 each   PATIENT EDUCATION:  Education details: ongoing PT POC Person educated: Patient Education method: Explanation Education comprehension: verbalized understanding   HOME EXERCISE PROGRAM: Access Code: [email or web address] URL: [email or web address] Date: 02/16/2022 Prepared by: Annie Paras  Exercises - Seated Ankle Circles  - 1 x daily - 7 x weekly - 3 sets - 10 reps - Seated Ankle Alphabet  - 3 x daily - 7 x weekly - 1 sets - 5 reps - Seated Ankle Inversion Eversion PROM  - 2 x daily - 7 x weekly - 1 sets - 3 reps - 30 sec hold - Long Sitting Ankle PNF D1 AROM  - 3 x daily - 7 x weekly - 1 sets - 10 reps - Long Sitting Ankle PNF D2 AROM   - 3 x daily - 7 x weekly - 1 sets - 10 reps - Standing Gastroc Stretch  - 2 x daily - 7 x weekly - 3 reps - 30s hold - Long Sitting Ankle Eversion with Resistance  - 1 x daily - 3-4 x weekly - 3 sets - 10 reps - Long Sitting Ankle Inversion with Resistance  - 1 x daily - 3-4 x weekly - 3 sets - 10 reps - Seated Ankle Dorsiflexion with Resistance  - 1 x daily - 3-4 x weekly - 2 sets - 10 reps - Long Sitting Ankle PNF D1 Dorsiflexion with Resistance  - 1 x  daily - 3-4 x weekly - 3 sets - 10 reps - Long Sitting Ankle PNF D2 Dorsiflexion with Resistance  - 1 x daily - 3-4 x weekly - 3 sets - 10 reps - Standing Heel Raise  - 2 x daily - 7 x weekly - 1-2 sets - 10 reps - Standing Hip Flexion with Anchored Resistance and Chair Support  - 1 x daily - 3 x weekly - 2 sets - 10 reps - 3 sec hold - Standing Hip Adduction with Anchored Resistance  - 1 x daily - 3 x weekly - 2 sets - 10 reps - 3 sec hold - Standing Hip Extension with Anchored Resistance  - 1 x daily - 3 x weekly - 2 sets - 10 reps - 3 sec hold - Standing Hip Abduction with Anchored Resistance  - 1 x daily - 3 x weekly - 2 sets - 10 reps - 3 sec hold   ASSESSMENT:  CLINICAL IMPRESSION: Nahia reports improving motion in her L ankle with decreased pain, but still notes that her L LE fatigues more quickly. Continued to progress functional LE strengthening, along with proprioceptive and balance training. Fatigue noted with intermittent rest breaks and smaller sets necessary for some exercises, but no increased pain reported. Attention to activities somewhat limited due to presence of pt's toddler-age daughter.   OBJECTIVE IMPAIRMENTS Abnormal gait, decreased balance, decreased ROM, decreased strength, increased edema, impaired flexibility, postural dysfunction, obesity, and pain.   ACTIVITY LIMITATIONS  not limited but painful  PARTICIPATION LIMITATIONS:  not limited but painful  PERSONAL FACTORS 3+ comorbidities: Asthma, chronic back pain, obesity  are also affecting patient's functional outcome.   REHAB  POTENTIAL: Excellent  CLINICAL DECISION MAKING: Stable/uncomplicated  EVALUATION COMPLEXITY: Low   GOALS: Goals reviewed with patient? Yes  SHORT TERM GOALS: Target date: 02/08/2022   Patient will be independent with initial HEP. Baseline: no HEP Goal status: MET    LONG TERM GOALS: Target date: 03/08/2022   Patient will be independent with advanced/ongoing HEP to improve  outcomes and carryover.  Baseline: no HEP Goal status: IN PROGRESS  2.  Patient will report at least 75% improvement in left ankle/foot pain with standing and walking to improve QOL. Baseline: 70% improvement 02/08/22 Goal status: IN PROGRESS  3.  Patient will demonstrate improved left ankle AROM to WNL to allow for normal gait and stair mechanics. Baseline:  Goal status: IN PROGRESS  4.  Patient will demonstrate improved functional LE strength as demonstrated by ability to balance without difficulty on the left LE. Baseline: SLS = 11 sec on L LE 02/08/22 Goal status: IN PROGRESS  5.  Patient will be able to ascend/descend stairs with 1 HR and reciprocal step pattern safely to access home and community.  Baseline: mild pain with descent 02/08/22 Goal status: IN PROGRESS  7.  Patient will report 72 or better on LEFS to demonstrate improved functional ability. Baseline: 62/80 Goal status: IN PROGRESS   PLAN: PT FREQUENCY: 2x/week  PT DURATION: 6 weeks  PLANNED INTERVENTIONS: Therapeutic exercises, Therapeutic activity, Neuromuscular re-education, Balance training, Gait training, Patient/Family education, Self Care, Joint mobilization, Stair training, Dry Needling, Cryotherapy, Moist heat, Taping, Manual therapy, and Re-evaluation  PLAN FOR NEXT SESSION:  ankle DF ROM and PF strength, gait/balance, progress HEP as indicated   Percival Spanish, PT 02/22/2022, 3:17 PM

## 2022-02-28 ENCOUNTER — Ambulatory Visit: Payer: Medicaid Other

## 2022-03-07 ENCOUNTER — Encounter: Payer: Self-pay | Admitting: Physical Therapy

## 2022-03-07 ENCOUNTER — Ambulatory Visit: Payer: Medicaid Other | Admitting: Physical Therapy

## 2022-03-07 DIAGNOSIS — M25672 Stiffness of left ankle, not elsewhere classified: Secondary | ICD-10-CM

## 2022-03-07 DIAGNOSIS — M25572 Pain in left ankle and joints of left foot: Secondary | ICD-10-CM

## 2022-03-07 DIAGNOSIS — R600 Localized edema: Secondary | ICD-10-CM

## 2022-03-07 DIAGNOSIS — R2689 Other abnormalities of gait and mobility: Secondary | ICD-10-CM

## 2022-03-07 DIAGNOSIS — M6281 Muscle weakness (generalized): Secondary | ICD-10-CM

## 2022-03-07 NOTE — Therapy (Signed)
OUTPATIENT PHYSICAL THERAPY TREATMENT NOTE   Patient Name: Cassandra Harrison MRN: [id number] DOB:31-Jan-1991, 31 y.o., female Today's Date: 03/07/2022   PT End of Session - 03/07/22 0940     Visit Number 8    Number of Visits 12    Date for PT Re-Evaluation 03/12/22    Authorization Type [place] [employer]    Authorization Time Period 01/29/22 - 03/30/22    Authorization - Visit Number --   in review   Authorization - Number of Visits --   pending   PT Start Time 0940   Pt arrived late   PT Stop Time 1018    PT Time Calculation (min) 38 min    Activity Tolerance Patient tolerated treatment well    Behavior During Therapy [facility] for tasks assessed/performed                    Past Medical History:  Diagnosis Date   Asthma    last used 11/29/11   Bulging disc    Chronic back pain    Complication of anesthesia    itching with epidural   Constipation    COVID-19 virus infection 05/2020   Migraine    Morbid obesity ([facility])    Past Surgical History:  Procedure Laterality Date   WISDOM TOOTH EXTRACTION     Patient Active Problem List   Diagnosis Date Noted   Sprain of anterior talofibular ligament of left ankle 12/14/2021   Smoker within last 12 months 08/02/2020   Female stress incontinence 08/02/2020   Asthma    Morbid obesity ([facility])    COVID-19 virus infection 05/2020    PCP: Leilani Able, MD  REFERRING PROVIDER: Myra Rude, MD  REFERRING DIAG: [phone] (ICD-10-CM) - Sprain of anterior talofibular ligament of left ankle, subsequent encounter  THERAPY DIAG:  Stiffness of left ankle, not elsewhere classified  Pain in left ankle and joints of left foot  Muscle weakness (generalized)  Other abnormalities of gait and mobility  Localized edema  RATIONALE FOR EVALUATION AND TREATMENT: Rehabilitation  ONSET DATE: 12/09/21  NEXT MD VISIT: none scheduled/PRN   SUBJECTIVE:   SUBJECTIVE STATEMENT:  Pt reports she can now turn her foot w/o it hurting  but still feels some tightness at times.[name]  PAIN:  Are you having pain? No  PERTINENT HISTORY: Asthma, chronic back pain, obesity  PRECAUTIONS: None  WEIGHT BEARING RESTRICTIONS No  FALLS:  Has patient fallen in last 6 months? Yes. 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 Not limited with standing though may be painful.  PLOF: Independent  PATIENT GOALS full ROM and minimal pain   OBJECTIVE:   DIAGNOSTIC FINDINGS:  XR: 12/10/21: Lateral soft tissue swelling is seen. No acute fracture or dislocation is noted  PATIENT SURVEYS:  LEFS 62/80  EDEMA:  Mild swelling compared to Rt  MUSCLE LENGTH:  Tight gastroc bil, ant tib L tight, peroneals tight left  POSTURE:  weight shift right  PALPATION: Tender just distal to bil malleoli  LOWER EXTREMITY ROM:   AROM Right eval Left AROM eval Left AROM 02/08/22 Left AROM 03/07/22  Ankle dorsiflexion -4/5 A/P neutral 3 14  Ankle plantarflexion 67 47 55 51  Ankle inversion 30 20 23  32  Ankle eversion 35 20 40 34  PROM Left PROM eval  Ankle dorsiflexion +10  Ankle plantarflexion 56  Ankle inversion 30  Ankle eversion 38   (Blank rows = not tested)  LOWER EXTREMITY MMT:  MMT Right eval Left eval Left 02/08/22 Right  03/07/22 Left 03/07/22  Hip flexion  $Remove'5  5 5  'EHuwOcQ$ Hip extension    5 4+  Hip abduction    5 5  Hip adduction    5 4+  Hip internal rotation    5 5  Hip external rotation    4+ 4  Knee flexion  $Remove'5  5 5  'SGhNWvm$ Knee extension  $RemoveBe'5  5 5  'FiVGAEZxC$ Ankle dorsiflexion  $RemoveBefor'5 5 5 5  'iMhAcaZLpPNx$ Ankle plantarflexion  4+ sitting Still limited with SL heel raise 4  12 SLS HR 4 11 SLS HR  Ankle inversion  5 5 5- mild medial ankle pain 5  Ankle eversion  4+ 5 mild pain 5 5   (Blank rows = not tested)   FUNCTIONAL TESTS:   SLS 4 sec and unstable SLS R 11 seconds 02/08/22   GAIT: Distance walked: 30 Assistive device utilized: None Level of assistance: Complete Independence Comments: short stride length bil    TODAY'S TREATMENT:  03/07/22 THERAPEUTIC EXERCISE: to improve flexibility, strength and mobility.  Verbal and tactile cues throughout for technique. Rec Bike - L4 x 6 min L fwd step-up to SLS on 6" step x 10 L SLS eccentric lateral step-downs from 6" step 2 x 10 L SLS eccentric forward step-downs from 6" step 2 x 5 - limited by medial posterior ankle pain L runner's gastroc stretch 2 x 30" L posterior tibialis stretch (runner's stretch with towel folded under lateral foot) 2 x 30" B concentric / L eccentric negative heel raise over edge of step 2 x 10  THERAPEUTIC ACTIVITIES: ROM & MMT assessment   02/22/22 THERAPEUTIC EXERCISE: to improve flexibility, strength and mobility.  Verbal and tactile cues throughout for technique. NuStep - L6 x 6 min (LE only) L/R SLS eccentric lateral step-downs from 6" step 2 x 10; 1 pole A for balance L/R SLS eccentric forward step-downs from 6" step 2 x 10 on R, 4 x 5 on L; 1 pole A for balance - cues to avoid excessive fwd motion of knee past foot B concentric / L eccentric heel raise 2 x 10; 1 pole A for balance B ankle press (using chest press foot lever) 25# 3 x 10 L SLS on blue foam oval + 4-5 way tap to colored dots x 10, 1 pole A for balance  NEUROMUSCULAR RE-EDUCATION: To improve balance, proprioception, and coordination. Tandem gait fwd & back along foam balance beam x 4 passes B side-stepping along foam balance beam x 2 passes for each pattern  - feet centered on beam  - heels centered on beam with level foot  - forefeet centered on beam with level foot   02/16/22 THERAPEUTIC EXERCISE: to improve flexibility, strength and mobility.  Verbal and tactile cues throughout for technique. Rec Bike - L3 x 8 min L/R SLS + R/L GTB 4-way hip x 10, 1 pole A  for balance - cues to avoid knee hyperextension on stance leg R/L lunge x 10 each, single UE support on counter TRX R/L lateral lunge x 10 each  NEUROMUSCULAR RE-EDUCATION: To improve balance, proprioception, and coordination. Tandem gait fwd & back along foam balance beam x 4 passes B side-stepping along foam balance beam x 2 passes for each pattern  -  feet centered on beam  - heels centered on beam with level foot  - forefeet centered on beam with level foot   02/14/22 THERAPEUTIC EXERCISE: to improve flexibility, strength and mobility.  Verbal and tactile cues throughout for technique. NuStep - L5 x 6 min (LE only) Long-sitting GTB PNF diagonals x 20 each L/R SLS + R/L GTB 4-way hip x 10, 1 pole A for balance - cues to avoid knee hyperextension on stance leg L SLS eccentric lateral step-downs from 6" step 2 x 10; 1 pole A for balance L SLS eccentric forward step-downs from 4" step 2 x 10; 1 pole A for balance - cues to avoid excessive fwd motion of knee past foot   02/08/22 THERAPEUTIC EXERCISE: to improve flexibility, strength and mobility.  Verbal and tactile cues throughout for technique. Elliptical L2.0 x 5 min Gastroc stretch 2x 30 sec L L SLS + 5-way reach to colored dots x 10 reps, intermittent 1 pole A for balance - cues to avoid knee hyperextension on stance leg L SLS eccentric step-downs from 6" step 2 x 10; 1 pole A for balance B concentric heel raise with L eccentric emphasis 2 x 10 Seated GTB L ankle 4-way (PF/DF/IV/EV) x 20 each Long-sitting GTB PNF diagonals x 20 each   PATIENT EDUCATION:  Education details: ongoing PT POC Person educated: Patient Education method: Explanation Education comprehension: verbalized understanding   HOME EXERCISE PROGRAM: Access Code: [email or web address] URL: [email or web address] Date: 02/16/2022 Prepared by: Annie Paras  Exercises - Seated Ankle Circles  - 1 x daily - 7 x weekly - 3 sets - 10 reps - Seated Ankle Alphabet   - 3 x daily - 7 x weekly - 1 sets - 5 reps - Seated Ankle Inversion Eversion PROM  - 2 x daily - 7 x weekly - 1 sets - 3 reps - 30 sec hold - Long Sitting Ankle PNF D1 AROM  - 3 x daily - 7 x weekly - 1 sets - 10 reps - Long Sitting Ankle PNF D2 AROM   - 3 x daily - 7 x weekly - 1 sets - 10 reps - Standing Gastroc Stretch  - 2 x daily - 7 x weekly - 3 reps - 30s hold - Long Sitting Ankle Eversion with Resistance  - 1 x daily - 3-4 x weekly - 3 sets - 10 reps - Long Sitting Ankle Inversion with Resistance  - 1 x daily - 3-4 x weekly - 3 sets - 10 reps - Seated Ankle Dorsiflexion with Resistance  - 1 x daily - 3-4 x weekly - 2 sets - 10 reps - Long Sitting Ankle PNF D1 Dorsiflexion with Resistance  - 1 x daily - 3-4 x weekly - 3 sets - 10 reps - Long Sitting Ankle PNF D2 Dorsiflexion with Resistance  - 1 x daily - 3-4 x weekly - 3 sets - 10 reps - Standing Heel Raise  - 2 x daily - 7 x weekly - 1-2 sets - 10 reps - Standing Hip Flexion with Anchored Resistance and Chair Support  - 1 x daily - 3 x weekly - 2 sets - 10 reps - 3 sec hold - Standing Hip Adduction with Anchored Resistance  - 1 x daily - 3 x weekly - 2 sets - 10 reps - 3 sec hold - Standing Hip Extension with Anchored Resistance  - 1 x daily - 3 x weekly - 2 sets - 10 reps - 3 sec hold -  Standing Hip Abduction with Anchored Resistance  - 1 x daily - 3 x weekly - 2 sets - 10 reps - 3 sec hold   ASSESSMENT:  CLINICAL IMPRESSION: Adlee reports she is able to turn her ankle w/o triggering the pain but still notes sensation of tightness and continues to have difficulty with stair negotiation, especially on descent. L ankle ROM now WNL but pain still present with resisted inversion during MMT. Mild weakness still evident in L ankle, most notably in PF, along with mild proximal weakness in L hip ER, adduction and extension. Therapeutic exercises targeting movements necessary for safe stair negotiation - fatigue noted with step-up but no  increased pain, however medial ankle pain limiting eccentric motion with lateral and forward step downs. Emilya will benefit from continued skilled PT to address remaining strength and functional mobility deficits. She has 2 visits remaining in her POC and anticipate she should be ready to transition to her HEP at that time.  OBJECTIVE IMPAIRMENTS Abnormal gait, decreased balance, decreased ROM, decreased strength, increased edema, impaired flexibility, postural dysfunction, obesity, and pain.   ACTIVITY LIMITATIONS  not limited but painful  PARTICIPATION LIMITATIONS:  not limited but painful  PERSONAL FACTORS 3+ comorbidities: Asthma, chronic back pain, obesity  are also affecting patient's functional outcome.   REHAB POTENTIAL: Excellent  CLINICAL DECISION MAKING: Stable/uncomplicated  EVALUATION COMPLEXITY: Low   GOALS: Goals reviewed with patient? Yes  SHORT TERM GOALS: Target date: 02/08/2022   Patient will be independent with initial HEP. Baseline: no HEP Goal status: MET    LONG TERM GOALS: Target date: 03/08/2022   Patient will be independent with advanced/ongoing HEP to improve outcomes and carryover.  Baseline: no HEP Goal status: IN PROGRESS  2.  Patient will report at least 75% improvement in left ankle/foot pain with standing and walking to improve QOL. Baseline: 70% improvement 02/08/22 Goal status: IN PROGRESS  3.  Patient will demonstrate improved left ankle AROM to WNL to allow for normal gait and stair mechanics. Baseline:  Goal status: IN PROGRESS  4.  Patient will demonstrate improved functional LE strength as demonstrated by ability to balance without difficulty on the left LE. Baseline: SLS = 11 sec on L LE 02/08/22 Goal status: IN PROGRESS  5.  Patient will be able to ascend/descend stairs with 1 HR and reciprocal step pattern safely to access home and community.  Baseline: mild pain with descent 02/08/22 Goal status: IN PROGRESS  7.  Patient will  report 72 or better on LEFS to demonstrate improved functional ability. Baseline: 62/80 Goal status: IN PROGRESS   PLAN: PT FREQUENCY: 2x/week  PT DURATION: 6 weeks  PLANNED INTERVENTIONS: Therapeutic exercises, Therapeutic activity, Neuromuscular re-education, Balance training, Gait training, Patient/Family education, Self Care, Joint mobilization, Stair training, Dry Needling, Cryotherapy, Moist heat, Taping, Manual therapy, and Re-evaluation  PLAN FOR NEXT SESSION:  ankle PF and proximal LE strength, stair training; gait/balance, progress HEP as indicated   Percival Spanish, PT 03/07/2022, 10:34 AM

## 2022-03-09 ENCOUNTER — Ambulatory Visit: Payer: Medicaid Other

## 2022-03-09 DIAGNOSIS — M25672 Stiffness of left ankle, not elsewhere classified: Secondary | ICD-10-CM | POA: Diagnosis not present

## 2022-03-09 DIAGNOSIS — R2689 Other abnormalities of gait and mobility: Secondary | ICD-10-CM

## 2022-03-09 DIAGNOSIS — R600 Localized edema: Secondary | ICD-10-CM

## 2022-03-09 DIAGNOSIS — M25572 Pain in left ankle and joints of left foot: Secondary | ICD-10-CM

## 2022-03-09 DIAGNOSIS — M6281 Muscle weakness (generalized): Secondary | ICD-10-CM

## 2022-03-09 NOTE — Therapy (Addendum)
OUTPATIENT PHYSICAL THERAPY TREATMENT NOTE / DISCHARGE SUMMARY   Patient Name: ROSALIE BUENAVENTURA MRN: [id number] DOB:1990/10/13, 31 y.o., female Today's Date: 03/09/2022   PT End of Session - 03/09/22 1017     Visit Number 9    Number of Visits 12    Date for PT Re-Evaluation 03/12/22    Authorization Type [place] [employer]    Authorization Time Period 01/29/22 - 03/30/22    Authorization - Visit Number --   in review   Authorization - Number of Visits --   pending   PT Start Time 0931    PT Stop Time 1015    PT Time Calculation (min) 44 min    Activity Tolerance Patient tolerated treatment well    Behavior During Therapy [facility] for tasks assessed/performed                     Past Medical History:  Diagnosis Date   Asthma    last used 11/29/11   Bulging disc    Chronic back pain    Complication of anesthesia    itching with epidural   Constipation    COVID-19 virus infection 05/2020   Migraine    Morbid obesity ([place])    Past Surgical History:  Procedure Laterality Date   WISDOM TOOTH EXTRACTION     Patient Active Problem List   Diagnosis Date Noted   Sprain of anterior talofibular ligament of left ankle 12/14/2021   Smoker within last 12 months 08/02/2020   Female stress incontinence 08/02/2020   Asthma    Morbid obesity ([place])    COVID-19 virus infection 05/2020    PCP: Lin Landsman, MD  REFERRING PROVIDER: Rosemarie Ax, MD  REFERRING DIAG: [phone] (ICD-10-CM) - Sprain of anterior talofibular ligament of left ankle, subsequent encounter  THERAPY DIAG:  Stiffness of left ankle, not elsewhere classified  Pain in left ankle and joints of left foot  Muscle weakness (generalized)  Other abnormalities of gait and mobility  Localized edema  RATIONALE FOR EVALUATION AND TREATMENT: Rehabilitation  ONSET DATE: 12/09/21  NEXT MD VISIT: none scheduled/PRN   SUBJECTIVE:   SUBJECTIVE STATEMENT:  Pt reports the ankle feels good today, she does  HEP when able to.  PAIN:  Are you having pain? No  PERTINENT HISTORY: Asthma, chronic back pain, obesity  PRECAUTIONS: None  WEIGHT BEARING RESTRICTIONS No  FALLS:  Has patient fallen in last 6 months? Yes. 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 Not limited with standing though may be painful.  PLOF: Independent  PATIENT GOALS full ROM and minimal pain   OBJECTIVE:   DIAGNOSTIC FINDINGS:  XR: 12/10/21: Lateral soft tissue swelling is seen. No acute fracture or dislocation is noted  PATIENT SURVEYS:  LEFS 62/80  EDEMA:  Mild swelling compared to Rt  MUSCLE LENGTH:  Tight gastroc bil, ant tib L tight, peroneals tight left  POSTURE:  weight shift right  PALPATION: Tender just distal to bil malleoli  LOWER EXTREMITY ROM:   AROM Right eval Left AROM eval Left AROM 02/08/22 Left AROM 03/07/22  Ankle dorsiflexion -4/5 A/P neutral 3 14  Ankle plantarflexion 67 47 55 51  Ankle inversion _0 32  Ankle eversion 35 20 40 34     PROM  Left PROM eval  Ankle dorsiflexion +10  Ankle plantarflexion 56  Ankle inversion 30  Ankle eversion 38   (Blank rows = not tested)  LOWER EXTREMITY MMT:  MMT Right eval Left eval Left 02/08/22 Right  03/07/22 Left 03/07/22  Hip flexion  _0 Hip extension    5 4+  Hip abduction    5 5  Hip adduction    5 4+  Hip internal rotation    5 5  Hip external rotation    4+ 4  Knee flexion  _1 Knee extension  _2 Ankle dorsiflexion  _3 Ankle plantarflexion  4+ sitting Still limited with SL heel raise 4  12 SLS HR 4 11 SLS HR  Ankle inversion  5 5 5- mild medial ankle pain 5  Ankle eversion  4+ 5 mild pain 5 5   (Blank rows = not tested)   FUNCTIONAL TESTS:  SLS 4 sec and  unstable SLS R 11 seconds 02/08/22   GAIT: Distance walked: 30 Assistive device utilized: None Level of assistance: Complete Independence Comments: short stride length bil    TODAY'S TREATMENT: 03/09/22 Therapeutic Exercise: Nustep L5x74mn LE only Eccentric step down fwd/lateral x 15 each Eccentric heel raise x 20  Star excursion 1/2 circle 6x  Standing squats x 20  L SLS 3x30" Fwd step ups  03/07/22 THERAPEUTIC EXERCISE: to improve flexibility, strength and mobility.  Verbal and tactile cues throughout for technique. Rec Bike - L4 x 6 min L fwd step-up to SLS on 6" step x 10 L SLS eccentric lateral step-downs from 6" step 2 x 10 L SLS eccentric forward step-downs from 6" step 2 x 5 - limited by medial posterior ankle pain L runner's gastroc stretch 2 x 30" L posterior tibialis stretch (runner's stretch with towel folded under lateral foot) 2 x 30" B concentric / L eccentric negative heel raise over edge of step 2 x 10  THERAPEUTIC ACTIVITIES: ROM & MMT assessment   02/22/22 THERAPEUTIC EXERCISE: to improve flexibility, strength and mobility.  Verbal and tactile cues throughout for technique. NuStep - L6 x 6 min (LE only) L/R SLS eccentric lateral step-downs from 6" step 2 x 10; 1 pole A for balance L/R SLS eccentric forward step-downs from 6" step 2 x 10 on R, 4 x 5 on L; 1 pole A for balance - cues to avoid excessive fwd motion of knee past foot B concentric / L eccentric heel raise 2 x 10; 1 pole A for balance B ankle press (using chest press foot lever) 25# 3 x 10 L SLS on blue foam oval + 4-5 way tap to colored dots x 10, 1 pole A for balance  NEUROMUSCULAR RE-EDUCATION: To improve balance, proprioception, and coordination. Tandem gait fwd & back along foam balance beam x 4 passes B side-stepping along foam balance beam x 2 passes for each pattern  - feet centered on beam  - heels centered on beam with level foot  - forefeet centered on beam with level  foot   02/16/22 THERAPEUTIC EXERCISE: to improve flexibility, strength and mobility.  Verbal and tactile cues throughout for technique. Rec Bike - L3 x 8 min L/R SLS + R/L GTB 4-way hip x 10, 1 pole A for balance - cues to avoid knee hyperextension on stance leg R/L lunge x 10 each, single UE support on counter TRX R/L lateral lunge x 10  each  NEUROMUSCULAR RE-EDUCATION: To improve balance, proprioception, and coordination. Tandem gait fwd & back along foam balance beam x 4 passes B side-stepping along foam balance beam x 2 passes for each pattern  - feet centered on beam  - heels centered on beam with level foot  - forefeet centered on beam with level foot   02/14/22 THERAPEUTIC EXERCISE: to improve flexibility, strength and mobility.  Verbal and tactile cues throughout for technique. NuStep - L5 x 6 min (LE only) Long-sitting GTB PNF diagonals x 20 each L/R SLS + R/L GTB 4-way hip x 10, 1 pole A for balance - cues to avoid knee hyperextension on stance leg L SLS eccentric lateral step-downs from 6" step 2 x 10; 1 pole A for balance L SLS eccentric forward step-downs from 4" step 2 x 10; 1 pole A for balance - cues to avoid excessive fwd motion of knee past foot   02/08/22 THERAPEUTIC EXERCISE: to improve flexibility, strength and mobility.  Verbal and tactile cues throughout for technique. Elliptical L2.0 x 5 min Gastroc stretch 2x 30 sec L L SLS + 5-way reach to colored dots x 10 reps, intermittent 1 pole A for balance - cues to avoid knee hyperextension on stance leg L SLS eccentric step-downs from 6" step 2 x 10; 1 pole A for balance B concentric heel raise with L eccentric emphasis 2 x 10 Seated GTB L ankle 4-way (PF/DF/IV/EV) x 20 each Long-sitting GTB PNF diagonals x 20 each   PATIENT EDUCATION:  Education details: ongoing PT POC Person educated: Patient Education method: Explanation Education comprehension: verbalized understanding   HOME EXERCISE PROGRAM: Access  Code: [email or web address] URL: [email or web address] Date: 02/16/2022 Prepared by: Annie Paras  Exercises - Seated Ankle Circles  - 1 x daily - 7 x weekly - 3 sets - 10 reps - Seated Ankle Alphabet  - 3 x daily - 7 x weekly - 1 sets - 5 reps - Seated Ankle Inversion Eversion PROM  - 2 x daily - 7 x weekly - 1 sets - 3 reps - 30 sec hold - Long Sitting Ankle PNF D1 AROM  - 3 x daily - 7 x weekly - 1 sets - 10 reps - Long Sitting Ankle PNF D2 AROM   - 3 x daily - 7 x weekly - 1 sets - 10 reps - Standing Gastroc Stretch  - 2 x daily - 7 x weekly - 3 reps - 30s hold - Long Sitting Ankle Eversion with Resistance  - 1 x daily - 3-4 x weekly - 3 sets - 10 reps - Long Sitting Ankle Inversion with Resistance  - 1 x daily - 3-4 x weekly - 3 sets - 10 reps - Seated Ankle Dorsiflexion with Resistance  - 1 x daily - 3-4 x weekly - 2 sets - 10 reps - Long Sitting Ankle PNF D1 Dorsiflexion with Resistance  - 1 x daily - 3-4 x weekly - 3 sets - 10 reps - Long Sitting Ankle PNF D2 Dorsiflexion with Resistance  - 1 x daily - 3-4 x weekly - 3 sets - 10 reps - Standing Heel Raise  - 2 x daily - 7 x weekly - 1-2 sets - 10 reps - Standing Hip Flexion with Anchored Resistance and Chair Support  - 1 x daily - 3 x weekly - 2 sets - 10 reps - 3 sec hold - Standing Hip Adduction with Anchored Resistance  - 1 x daily - 3 x weekly -  2 sets - 10 reps - 3 sec hold - Standing Hip Extension with Anchored Resistance  - 1 x daily - 3 x weekly - 2 sets - 10 reps - 3 sec hold - Standing Hip Abduction with Anchored Resistance  - 1 x daily - 3 x weekly - 2 sets - 10 reps - 3 sec hold   ASSESSMENT:  CLINICAL IMPRESSION: Pt demonstrated a good response to treatment. Continued with progression of WB exercises and proprioceptive exercises to improve work tolerance and functional abilities. Most difficulty was noted with L SLS and eccentric fwd step downs. Cues were provided occasionally with exercises, mostly with squats for  posterior WS. were limited with mostly d/t patient having her toddler present.  OBJECTIVE IMPAIRMENTS Abnormal gait, decreased balance, decreased ROM, decreased strength, increased edema, impaired flexibility, postural dysfunction, obesity, and pain.   ACTIVITY LIMITATIONS  not limited but painful  PARTICIPATION LIMITATIONS:  not limited but painful  PERSONAL FACTORS 3+ comorbidities: Asthma, chronic back pain, obesity  are also affecting patient's functional outcome.   REHAB POTENTIAL: Excellent  CLINICAL DECISION MAKING: Stable/uncomplicated  EVALUATION COMPLEXITY: Low   GOALS: Goals reviewed with patient? Yes  SHORT TERM GOALS: Target date: 02/08/2022   Patient will be independent with initial HEP. Baseline: no HEP Goal status: MET    LONG TERM GOALS: Target date: 03/08/2022   Patient will be independent with advanced/ongoing HEP to improve outcomes and carryover.  Baseline: no HEP Goal status: IN PROGRESS  2.  Patient will report at least 75% improvement in left ankle/foot pain with standing and walking to improve QOL. Baseline: 70% improvement 02/08/22 Goal status: IN PROGRESS  3.  Patient will demonstrate improved left ankle AROM to WNL to allow for normal gait and stair mechanics. Baseline:  Goal status: IN PROGRESS  4.  Patient will demonstrate improved functional LE strength as demonstrated by ability to balance without difficulty on the left LE. Baseline: SLS = 11 sec on L LE 02/08/22 Goal status: IN PROGRESS  5.  Patient will be able to ascend/descend stairs with 1 HR and reciprocal step pattern safely to access home and community.  Baseline: mild pain with descent 02/08/22 Goal status: IN PROGRESS  7.  Patient will report 72 or better on LEFS to demonstrate improved functional ability. Baseline: 62/80 Goal status: IN PROGRESS   PLAN: PT FREQUENCY: 2x/week  PT DURATION: 6 weeks  PLANNED INTERVENTIONS: Therapeutic exercises, Therapeutic activity,  Neuromuscular re-education, Balance training, Gait training, Patient/Family education, Self Care, Joint mobilization, Stair training, Dry Needling, Cryotherapy, Moist heat, Taping, Manual therapy, and Re-evaluation  PLAN FOR NEXT SESSION:  progress note/D/C; ankle PF and proximal LE strength, stair training; gait/balance, progress HEP as indicated   Artist Pais, PTA 03/09/2022, 10:18 AM   PHYSICAL THERAPY DISCHARGE SUMMARY  Visits from Start of Care: 9  Current functional level related to goals / functional outcomes:   Refer to above clinical impression and goal assessment for status as of last visit on 03/09/22. Patient cancelled her final scheduled visit and has not returned to PT in >30 days, therefore will proceed with discharge from PT for this episode.    Remaining deficits:   As above. Unable to formally assess status at discharge due to failure to return to PT.   Education / Equipment:   HEP   Patient agrees to discharge. Patient goals were partially met. Patient is being discharged due to not returning since the last visit.  Percival Spanish, PT, MPT 05/08/22,  9:28 AM  [facility] [address]  [place], [place], [address] Phone: [phone]   Fax:  [phone]

## 2022-03-12 ENCOUNTER — Ambulatory Visit: Payer: Medicaid Other | Admitting: Physical Therapy

## 2022-03-13 ENCOUNTER — Ambulatory Visit: Payer: Medicaid Other | Admitting: Physical Therapy

## 2022-09-26 ENCOUNTER — Encounter: Payer: Self-pay | Admitting: *Deleted

## 2023-01-30 ENCOUNTER — Ambulatory Visit (HOSPITAL_COMMUNITY)
Admission: EM | Admit: 2023-01-30 | Discharge: 2023-01-30 | Disposition: A | Discharge status: Home / Self Care | Payer: Medicaid Other | Attending: Internal Medicine | Admitting: Internal Medicine

## 2023-01-30 ENCOUNTER — Encounter (HOSPITAL_COMMUNITY): Payer: Self-pay

## 2023-01-30 DIAGNOSIS — N3001 Acute cystitis with hematuria: Secondary | ICD-10-CM | POA: Diagnosis not present

## 2023-01-30 DIAGNOSIS — T3695XA Adverse effect of unspecified systemic antibiotic, initial encounter: Secondary | ICD-10-CM | POA: Insufficient documentation

## 2023-01-30 DIAGNOSIS — Z3202 Encounter for pregnancy test, result negative: Secondary | ICD-10-CM | POA: Diagnosis not present

## 2023-01-30 DIAGNOSIS — B379 Candidiasis, unspecified: Secondary | ICD-10-CM | POA: Diagnosis not present

## 2023-01-30 LAB — POCT URINALYSIS DIP (MANUAL ENTRY)
Bilirubin, UA: NEGATIVE
Glucose, UA: NEGATIVE mg/dL
Ketones, POC UA: NEGATIVE mg/dL
Nitrite, UA: NEGATIVE
Protein Ur, POC: NEGATIVE mg/dL
Spec Grav, UA: 1.02 (ref 1.010–1.025)
Urobilinogen, UA: 0.2 E.U./dL
pH, UA: 7 (ref 5.0–8.0)

## 2023-01-30 LAB — POCT URINE PREGNANCY: Preg Test, Ur: NEGATIVE

## 2023-01-30 MED ORDER — FLUCONAZOLE 150 MG PO TABS: 150.0000 mg | ORAL_TABLET | ORAL | 0 refills | Status: DC

## 2023-01-30 MED ORDER — CEPHALEXIN 500 MG PO CAPS
500.0000 mg | ORAL_CAPSULE | Freq: Two times a day (BID) | ORAL | 0 refills | Status: AC
Start: 2023-01-30 — End: 2023-02-06

## 2023-01-30 MED ORDER — ONDANSETRON 4 MG PO TBDP: 4.0000 mg | ORAL_TABLET | Freq: Three times a day (TID) | ORAL | 0 refills | Status: DC | PRN

## 2023-01-30 NOTE — ED Provider Notes (Signed)
[facility]    CSN: [id number] Arrival date & time: 01/30/23  1855      History   Chief Complaint Chief Complaint  Patient presents with   Flank Pain   SEXUALLY TRANSMITTED DISEASE    HPI Cassandra Harrison is a 32 y.o. female.   Patient presents to urgent care for evaluation of dysuria, urinary frequency and urinary urgency that started 3 days ago. Reports associated bilateral abdominal and lower back discomfort with intermittent nausea without vomiting. No diarrhea, hematuria, constipation, or fever/chills. She does not take an SGLT-2 inhibitor.  She would like STD testing today as well. Recent new sexual partner, female, unprotected. No vaginal discharge, odor, or itch. Has not attempted treatment of symptoms PTA.    Flank Pain    Past Medical History:  Diagnosis Date   Asthma    last used 11/29/11   Bulging disc    Chronic back pain    Complication of anesthesia    itching with epidural   Constipation    COVID-19 virus infection 05/2020   Migraine    Morbid obesity [facility])     Patient Active Problem List   Diagnosis Date Noted   Sprain of anterior talofibular ligament of left ankle 12/14/2021   Smoker within last 12 months 08/02/2020   Female stress incontinence 08/02/2020   Asthma    Morbid obesity ([facility])    COVID-19 virus infection 05/2020    Past Surgical History:  Procedure Laterality Date   WISDOM TOOTH EXTRACTION      OB History     Gravida  2   Para  2   Term  2   Preterm  0   AB  0   Living  2      SAB  0   IAB  0   Ectopic  0   Multiple  0   Live Births  2            Home Medications    Prior to Admission medications   Medication Sig Start Date End Date Taking? Authorizing Provider  cephALEXin (KEFLEX) 500 MG capsule Take 1 capsule (500 mg total) by mouth 2 (two) times daily for 7 days. 01/30/23 02/06/23 Yes Carlisle Beers, FNP  fluconazole (DIFLUCAN) 150 MG tablet Take 1 tablet (150 mg total) by mouth  every 7 (seven) days. 01/30/23  Yes Carlisle Beers, FNP  ondansetron (ZOFRAN-ODT) 4 MG disintegrating tablet Take 1 tablet (4 mg total) by mouth every 8 (eight) hours as needed for nausea or vomiting. 01/30/23  Yes Carlisle Beers, FNP  albuterol (VENTOLIN HFA) 108 (90 Base) MCG/ACT inhaler Inhale 1-2 puffs into the lungs every 6 (six) hours as needed for wheezing or shortness of breath. 05/24/21   Prosperi, Christian H, PA-C  docusate sodium (COLACE) 100 MG capsule Take 1 capsule (100 mg total) by mouth 2 (two) times daily. Patient not taking: Reported on 01/25/2022 12/24/19   Brock Bad, MD  naproxen (NAPROSYN) 375 MG tablet Take 1 tablet twice daily as needed for pain. 12/10/21   Molpus, Jonny Ruiz, MD    Family History Family History  Problem Relation Age of Onset   Diabetes Mother    COPD Mother    Heart disease Mother    Cancer Mother        throat cancer   COPD Father    Heart disease Father    Cancer Sister    Cancer Paternal Aunt    Diabetes Maternal  Grandmother    Cancer Maternal Grandmother    COPD Paternal Grandmother    Heart disease Paternal Grandfather    Other Neg Hx     Social History Social History   Tobacco Use   Smoking status: Every Day    Current packs/day: 0.50    Types: Cigarettes   Smokeless tobacco: Never  Vaping Use   Vaping status: Never Used  Substance Use Topics   Alcohol use: Yes    Comment: occ   Drug use: Yes    Types: Marijuana     Allergies   Olive oil and Latex   Review of Systems Review of Systems  Genitourinary:  Positive for flank pain.  Per HPI   Physical Exam Triage Vital Signs ED Triage Vitals [01/30/23 1916]  Encounter Vitals Group     BP 101/65     Systolic BP Percentile      Diastolic BP Percentile      Pulse Rate 97     Resp 18     Temp 98.1 F (36.7 C)     Temp Source Oral     SpO2 99 %     Weight      Height      Head Circumference      Peak Flow      Pain Score 8     Pain Loc      Pain  Education      Exclude from Growth Chart    No data found.  Updated Vital Signs BP 101/65 (BP Location: Left Arm)   Pulse 97   Temp 98.1 F (36.7 C) (Oral)   Resp 18   LMP 01/14/2023   SpO2 99%   Breastfeeding No   Visual Acuity Right Eye Distance:   Left Eye Distance:   Bilateral Distance:    Right Eye Near:   Left Eye Near:    Bilateral Near:     Physical Exam Vitals and nursing note reviewed.  Constitutional:      Appearance: She is not ill-appearing or toxic-appearing.  HENT:     Head: Normocephalic and atraumatic.     Right Ear: Hearing and external ear normal.     Left Ear: Hearing and external ear normal.     Nose: Nose normal.     Mouth/Throat:     Lips: Pink.     Mouth: Mucous membranes are moist. No injury.     Tongue: No lesions. Tongue does not deviate from midline.     Palate: No mass and lesions.     Pharynx: Oropharynx is clear. Uvula midline. No pharyngeal swelling, oropharyngeal exudate, posterior oropharyngeal erythema or uvula swelling.     Tonsils: No tonsillar exudate or tonsillar abscesses.  Eyes:     General: Lids are normal. Vision grossly intact. Gaze aligned appropriately.     Extraocular Movements: Extraocular movements intact.     Conjunctiva/sclera: Conjunctivae normal.  Pulmonary:     Effort: Pulmonary effort is normal.  Abdominal:     General: Bowel sounds are normal.     Palpations: Abdomen is soft.     Tenderness: There is no abdominal tenderness. There is no right CVA tenderness, left CVA tenderness or guarding.  Musculoskeletal:     Cervical back: Neck supple.  Skin:    General: Skin is warm and dry.     Capillary Refill: Capillary refill takes less than 2 seconds.     Findings: No rash.  Neurological:     General: No  focal deficit present.     Mental Status: She is alert and oriented to person, place, and time. Mental status is at baseline.     Cranial Nerves: No dysarthria or facial asymmetry.  Psychiatric:        Mood  and Affect: Mood normal.        Speech: Speech normal.        Behavior: Behavior normal.        Thought Content: Thought content normal.        Judgment: Judgment normal.      UC Treatments / Results  Labs (all labs ordered are listed, but only abnormal results are displayed) Labs Reviewed  POCT URINALYSIS DIP (MANUAL ENTRY) - Abnormal; Notable for the following components:      Result Value   Blood, UA moderate (*)    Leukocytes, UA Moderate (2+) (*)    All other components within normal limits  URINE CULTURE  POCT URINE PREGNANCY  CERVICOVAGINAL ANCILLARY ONLY    EKG   Radiology No results found.  Procedures Procedures (including critical care time)  Medications Ordered in UC Medications - No data to display  Initial Impression / Assessment and Plan / UC Course  I have reviewed the triage vital signs and the nursing notes.  Pertinent labs & imaging results that were available during my care of the patient were reviewed by me and considered in my medical decision making (see chart for details).   1. Acute cystitis with hematuria Presentation is consistent with acute uncomplicated cystitis.   Will treat with keflex antibiotic as prescribed.  Urine culture pending. Low suspicion for acute pyelonephritis, kidney stone or infected stone. Appears well hydrated, therefore will defer labs/imaging.  Patient to push fluids to stay well hydrated and reduce intake of known urinary irritants. Discussed methods of preventing future UTI.  She requests treatment with diflucan to empirically treat for antibiotic induced yeast vaginitis.  2. STI labs pending, will notify patient of positive results and treat accordingly per protocol when labs result.  Patient declines HIV and syphilis testing today.   Patient to avoid sexual intercourse until screening testing comes back.   Safe sex education provided.  Counseled patient on potential for adverse effects with medications  prescribed/recommended today, strict ER and return-to-clinic precautions discussed, patient verbalized understanding.    Final Clinical Impressions(s) / UC Diagnoses   Final diagnoses:  Acute cystitis with hematuria  Negative pregnancy test  Antibiotic-induced yeast infection     Discharge Instructions      Your urine shows you likely have a urinary tract infection.  I have sent your urine for culture to confirm this.  We will call you if we need to change your antibiotic when we find out the type of bacteria growing in your bladder.  Take antibiotic as directed with a snack/food to avoid stomach upset. To avoid GI upset please take this medication with food.   Avoid drinking beverages that irritate the urinary tract like sodas, tea, coffee, or juice. Drink plenty of water to stay well hydrated and prevent severe infection.  If you develop any new or worsening symptoms or if your symptoms do not start to improve, pleases return here or follow-up with your primary care provider. If your symptoms are severe, please go to the emergency room.     ED Prescriptions     Medication Sig Dispense Auth. Provider   cephALEXin (KEFLEX) 500 MG capsule Take 1 capsule (500 mg total) by mouth 2 (two)  times daily for 7 days. 14 capsule Reita May M, FNP   fluconazole (DIFLUCAN) 150 MG tablet Take 1 tablet (150 mg total) by mouth every 7 (seven) days. 2 tablet Carlisle Beers, FNP   ondansetron (ZOFRAN-ODT) 4 MG disintegrating tablet Take 1 tablet (4 mg total) by mouth every 8 (eight) hours as needed for nausea or vomiting. 20 tablet Carlisle Beers, FNP      PDMP not reviewed this encounter.   Carlisle Beers, [place] 01/30/23 2119

## 2023-01-30 NOTE — ED Triage Notes (Signed)
Pt c/o rt flank pain, lower abdominal, burning on urination with urgency and frequency x3 days. Requesting STD testing.

## 2023-01-30 NOTE — Discharge Instructions (Signed)

## 2023-01-31 LAB — CERVICOVAGINAL ANCILLARY ONLY
Bacterial Vaginitis (gardnerella): POSITIVE — AB
Candida Glabrata: NEGATIVE
Candida Vaginitis: NEGATIVE
Chlamydia: NEGATIVE
Comment: NEGATIVE
Comment: NEGATIVE
Comment: NEGATIVE
Comment: NEGATIVE
Comment: NEGATIVE
Comment: NORMAL
Neisseria Gonorrhea: NEGATIVE
Trichomonas: NEGATIVE

## 2023-02-01 LAB — URINE CULTURE: Culture: 70000 — AB

## 2023-04-22 ENCOUNTER — Inpatient Hospital Stay (HOSPITAL_COMMUNITY)
Admission: AD | Admit: 2023-04-22 | Discharge: 2023-04-23 | Disposition: A | Discharge status: Home / Self Care | Payer: Medicaid Other | Attending: Obstetrics & Gynecology | Admitting: Obstetrics & Gynecology

## 2023-04-22 DIAGNOSIS — Z3202 Encounter for pregnancy test, result negative: Secondary | ICD-10-CM | POA: Diagnosis present

## 2023-04-22 DIAGNOSIS — N921 Excessive and frequent menstruation with irregular cycle: Secondary | ICD-10-CM | POA: Insufficient documentation

## 2023-04-22 LAB — POCT PREGNANCY, URINE: Preg Test, Ur: NEGATIVE

## 2023-04-22 NOTE — MAU Note (Addendum)
Pt says was on 03-22-2023. Had sex on 04-04-2023 Started VB on 10-28.  Heavier VB yesterday Then today  pinkish when she wipes . Sometimes feels cramps - 5/10 No HPT Says shoulder hurts in back

## 2023-04-23 DIAGNOSIS — N921 Excessive and frequent menstruation with irregular cycle: Secondary | ICD-10-CM

## 2023-04-23 NOTE — MAU Provider Note (Signed)
History:   Cassandra Harrison is a 32 y.o. [phone] here today for ultrasound results.  Abdominal pain: Yes  5/10 Vaginal bleeding: No    LMP 10/11 per patient that was normal, but then had break through bleeding on 10/28 and yesterday.   Today she has pinkish when she wipes, and sometimes feeling cramping 5/10. No home pregnancy test. No contraception. Not trying to get pregnant.   Declined discussion about shoulder pain, "its old"  Health Maintenance Due  Topic Date Due   Cervical Cancer Screening (HPV/Pap Cotest)  12/24/2022   INFLUENZA VACCINE  01/10/2023   COVID-19 Vaccine (1 - 2023-24 season) Never done    Past Medical History:  Diagnosis Date   Asthma    last used 11/29/11   Bulging disc    Chronic back pain    Complication of anesthesia    itching with epidural   Constipation    COVID-19 virus infection 05/2020   Migraine    Morbid obesity (HCC)     Past Surgical History:  Procedure Laterality Date   WISDOM TOOTH EXTRACTION      The following portions of the patient's history were reviewed and updated as appropriate: allergies, current medications, past family history, past medical history, past social history, past surgical history and problem list.   Health Maintenance:   Last pap: Lab Results  Component Value Date   DIAGPAP  12/24/2019    - Negative for intraepithelial lesion or malignancy (NILM)   HPVHIGH Negative 12/24/2019   Results for orders placed or performed during the hospital encounter of 04/22/23 (from the past 24 hour(s))  Pregnancy, urine POC     Status: None   Collection Time: 04/22/23 10:44 PM  Result Value Ref Range   Preg Test, Ur NEGATIVE NEGATIVE   Review of Systems:  Pertinent items noted in HPI and remainder of comprehensive ROS otherwise negative.  Physical Exam:  BP 119/74 (BP Location: Right Arm)   Pulse 80   Temp 98.2 F (36.8 C) (Oral)   Resp 16   Ht 5\' 2"  (1.575 m)   Wt 88.5 kg   LMP 03/22/2023   BMI 35.68 kg/m   CONSTITUTIONAL: Well-developed, well-nourished female in no acute distress.  HEENT:  Normocephalic, atraumatic. External right and left ear normal. No scleral icterus.  NECK: Normal range of motion, supple, no masses noted on observation SKIN: No rash noted. Not diaphoretic. No erythema. No pallor. MUSCULOSKELETAL: Normal range of motion. No edema noted. NEUROLOGIC: Alert and oriented to person, place, and time. Normal muscle tone coordination.  PSYCHIATRIC: Normal mood and affect. Normal behavior. Normal judgment and thought content. RESPIRATORY: Effort normal, no problems with respiration noted  Labs and Imaging Results for orders placed or performed during the hospital encounter of 04/22/23 (from the past 168 hour(s))  Pregnancy, urine POC   Collection Time: 04/22/23 10:44 PM  Result Value Ref Range   Preg Test, Ur NEGATIVE NEGATIVE   No results found.    Assessment and Plan:  Metorrhagia not emergent - discussed role for PCP and/or annual OB/GYN visit to evaluate abnormal menses  Discharge home Return precautions provided Recommended follow up with [facility] (previous OB)  Wyn Forster, MD Center for [facility], [facility]

## 2023-05-01 ENCOUNTER — Ambulatory Visit: Payer: Medicaid Other | Admitting: Obstetrics and Gynecology

## 2023-05-01 ENCOUNTER — Encounter: Payer: Self-pay | Admitting: Obstetrics and Gynecology

## 2023-05-01 ENCOUNTER — Other Ambulatory Visit (HOSPITAL_COMMUNITY)
Admission: RE | Admit: 2023-05-01 | Discharge: 2023-05-01 | Disposition: A | Discharge status: Home / Self Care | Payer: Medicaid Other | Source: Ambulatory Visit | Attending: Obstetrics and Gynecology | Admitting: Obstetrics and Gynecology

## 2023-05-01 VITALS — BP 120/80 | HR 89 | Ht 62.0 in | Wt 192.4 lb

## 2023-05-01 DIAGNOSIS — Z124 Encounter for screening for malignant neoplasm of cervix: Secondary | ICD-10-CM | POA: Diagnosis present

## 2023-05-01 DIAGNOSIS — Z3009 Encounter for other general counseling and advice on contraception: Secondary | ICD-10-CM

## 2023-05-01 DIAGNOSIS — Z113 Encounter for screening for infections with a predominantly sexual mode of transmission: Secondary | ICD-10-CM | POA: Insufficient documentation

## 2023-05-01 DIAGNOSIS — N939 Abnormal uterine and vaginal bleeding, unspecified: Secondary | ICD-10-CM

## 2023-05-01 NOTE — Progress Notes (Signed)
Pt presents for irregular bleeding. Pt reports bleeding for 15-16 days. No bleeding today  Last PAP 12/2019, requesting STD testing

## 2023-05-01 NOTE — Progress Notes (Signed)
GYNECOLOGY OFFICE NOTE  History:  32 y.o. [id number] here today for irregular bleeding. Had normal period October 11, lasting 3-4 days, this is usual for her. Had intercourse on the 10/24, then started bleeding 10/28, then bled until 04/23/23. Was alternately very heavy and then light. The prior month, also had bleeding after sex. Denies trauma during intercourse.   Not on anything for contraception, does not want to be on hormonal birth control. Interested in a tubal ligation. No changes in medical history.   Past Medical History:  Diagnosis Date   Asthma    last used 11/29/11   Bulging disc    Chronic back pain    Complication of anesthesia    itching with epidural   Constipation    COVID-19 virus infection 05/2020   Migraine    Morbid obesity (HCC)    Past Surgical History:  Procedure Laterality Date   WISDOM TOOTH EXTRACTION      Current Outpatient Medications:    albuterol (VENTOLIN HFA) 108 (90 Base) MCG/ACT inhaler, Inhale 1-2 puffs into the lungs every 6 (six) hours as needed for wheezing or shortness of breath. (Patient not taking: Reported on 05/01/2023), Disp: 8 g, Rfl: 0   docusate sodium (COLACE) 100 MG capsule, Take 1 capsule (100 mg total) by mouth 2 (two) times daily. (Patient not taking: Reported on 01/25/2022), Disp: 60 capsule, Rfl: 5   fluconazole (DIFLUCAN) 150 MG tablet, Take 1 tablet (150 mg total) by mouth every 7 (seven) days. (Patient not taking: Reported on 05/01/2023), Disp: 2 tablet, Rfl: 0   naproxen (NAPROSYN) 375 MG tablet, Take 1 tablet twice daily as needed for pain. (Patient not taking: Reported on 05/01/2023), Disp: 20 tablet, Rfl: 0   ondansetron (ZOFRAN-ODT) 4 MG disintegrating tablet, Take 1 tablet (4 mg total) by mouth every 8 (eight) hours as needed for nausea or vomiting. (Patient not taking: Reported on 05/01/2023), Disp: 20 tablet, Rfl: 0  The following portions of the patient's history were reviewed and updated as appropriate: allergies,  current medications, past family history, past medical history, past social history, past surgical history and problem list.   Review of Systems:  Pertinent items noted in HPI and remainder of comprehensive ROS otherwise negative.   Objective:  Physical Exam BP 120/80   Pulse 89   Ht 5\' 2"  (1.575 m)   Wt 192 lb 6.4 oz (87.3 kg)   LMP 04/08/2023   BMI 35.19 kg/m  CONSTITUTIONAL: Well-developed, well-nourished female in no acute distress.  HENT:  Normocephalic, atraumatic. External right and left ear normal. Oropharynx is clear and moist EYES: Conjunctivae and EOM are normal. Pupils are equal, round, and reactive to light. No scleral icterus.  NECK: Normal range of motion, supple, no masses SKIN: Skin is warm and dry. No rash noted. Not diaphoretic. No erythema. No pallor. NEUROLOGIC: Alert and oriented to person, place, and time. Normal reflexes, muscle tone coordination. No cranial nerve deficit noted. PSYCHIATRIC: Normal mood and affect. Normal behavior. Normal judgment and thought content. CARDIOVASCULAR: Normal heart rate noted RESPIRATORY: Effort normal, no problems with respiration noted ABDOMEN: Soft, no distention noted.   PELVIC: Normal appearing external genitalia; normal appearing vaginal mucosa and cervix.  No abnormal discharge noted.  Pap smear obtained.  pelvic cultures obtained. Normal uterine size, no other palpable masses, no uterine or adnexal tenderness. MUSCULOSKELETAL: Normal range of motion. No edema noted.  Exam done with chaperone present.  Labs and Imaging No results found.  Assessment & Plan:  1.  Routine screening for STI (sexually transmitted infections - Cervicovaginal ancillary only( [place]) - HIV antibody (with reflex) - RPR - Hepatitis B Surface AntiGEN - Hepatitis C Antibody  2. Cervical cancer screening - Cytology - PAP( [place])  3. Abnormal uterine bleeding (AUB) One ong episode of bleeding, will rule out obvious causes Return  for discussion and next steps - CBC - DHEA-sulfate - Beta hCG quant (ref lab) - Follicle stimulating hormone - Hemoglobin A1c - TSH Rfx on Abnormal to Free T4 - Testosterone,Free and Total - Prolactin - US PELVIC COMPLETE WITH TRANSVAGINAL; Future  4. Unwanted fertility Interested in BTL, will have see MD for consult   Routine preventative health maintenance measures emphasized. Please refer to After Visit Summary for other counseling recommendations.   Return in about 4 weeks (around 05/29/2023) for MD.   Baldemar Lenis, MD, [facility] Attending Center for [facility])

## 2023-05-03 ENCOUNTER — Encounter: Payer: Self-pay | Admitting: Obstetrics and Gynecology

## 2023-05-03 LAB — CERVICOVAGINAL ANCILLARY ONLY
Bacterial Vaginitis (gardnerella): POSITIVE — AB
Candida Glabrata: NEGATIVE
Candida Vaginitis: NEGATIVE
Chlamydia: NEGATIVE
Comment: NEGATIVE
Comment: NEGATIVE
Comment: NEGATIVE
Comment: NEGATIVE
Comment: NEGATIVE
Comment: NORMAL
Neisseria Gonorrhea: NEGATIVE
Trichomonas: NEGATIVE

## 2023-05-03 MED ORDER — METRONIDAZOLE 500 MG PO TABS: 500.0000 mg | ORAL_TABLET | Freq: Two times a day (BID) | ORAL | 0 refills | Status: AC

## 2023-05-03 NOTE — Addendum Note (Signed)
Addended by: Leroy Libman on: 05/03/2023 12:30 PM   Modules accepted: Orders

## 2023-05-04 LAB — TESTOSTERONE,FREE AND TOTAL
Testosterone, Free: 0.5 pg/mL (ref 0.0–4.2)
Testosterone: 23 ng/dL (ref 8–60)

## 2023-05-04 LAB — HEPATITIS B SURFACE ANTIGEN: Hepatitis B Surface Ag: NEGATIVE

## 2023-05-04 LAB — DHEA-SULFATE: DHEA-SO4: 79.8 ug/dL — ABNORMAL LOW (ref 84.8–378.0)

## 2023-05-04 LAB — CBC
Hematocrit: 44.3 % (ref 34.0–46.6)
Hemoglobin: 14.5 g/dL (ref 11.1–15.9)
MCH: 27.8 pg (ref 26.6–33.0)
MCHC: 32.7 g/dL (ref 31.5–35.7)
MCV: 85 fL (ref 79–97)
Platelets: 236 10*3/uL (ref 150–450)
RBC: 5.21 x10E6/uL (ref 3.77–5.28)
RDW: 13 % (ref 11.7–15.4)
WBC: 6 10*3/uL (ref 3.4–10.8)

## 2023-05-04 LAB — HEMOGLOBIN A1C
Est. average glucose Bld gHb Est-mCnc: 111 mg/dL
Hgb A1c MFr Bld: 5.5 % (ref 4.8–5.6)

## 2023-05-04 LAB — TSH RFX ON ABNORMAL TO FREE T4: TSH: 1.43 u[IU]/mL (ref 0.450–4.500)

## 2023-05-04 LAB — HEPATITIS C ANTIBODY: Hep C Virus Ab: NONREACTIVE

## 2023-05-04 LAB — RPR: RPR Ser Ql: NONREACTIVE

## 2023-05-04 LAB — BETA HCG QUANT (REF LAB): hCG Quant: <1 m[IU]/mL

## 2023-05-04 LAB — FOLLICLE STIMULATING HORMONE: FSH: 6.3 m[IU]/mL

## 2023-05-04 LAB — PROLACTIN: Prolactin: 19 ng/mL (ref 4.8–33.4)

## 2023-05-04 LAB — HIV ANTIBODY (ROUTINE TESTING W REFLEX): HIV Screen 4th Generation wRfx: NONREACTIVE

## 2023-05-06 ENCOUNTER — Encounter: Payer: Self-pay | Admitting: Obstetrics and Gynecology

## 2023-05-08 LAB — CYTOLOGY - PAP
Comment: NEGATIVE
Diagnosis: UNDETERMINED — AB
High risk HPV: NEGATIVE

## 2023-05-13 ENCOUNTER — Ambulatory Visit (HOSPITAL_COMMUNITY)
Admission: RE | Admit: 2023-05-13 | Discharge: 2023-05-13 | Disposition: A | Discharge status: Home / Self Care | Payer: Medicaid Other | Source: Ambulatory Visit | Attending: Obstetrics and Gynecology | Admitting: Obstetrics and Gynecology

## 2023-05-13 DIAGNOSIS — N939 Abnormal uterine and vaginal bleeding, unspecified: Secondary | ICD-10-CM | POA: Insufficient documentation

## 2023-05-29 ENCOUNTER — Ambulatory Visit: Payer: Medicaid Other | Admitting: Obstetrics and Gynecology

## 2023-06-10 ENCOUNTER — Encounter: Payer: Self-pay | Admitting: Obstetrics and Gynecology

## 2023-06-10 ENCOUNTER — Ambulatory Visit (INDEPENDENT_AMBULATORY_CARE_PROVIDER_SITE_OTHER): Payer: Medicaid Other | Admitting: Obstetrics and Gynecology

## 2023-06-10 VITALS — BP 108/77 | HR 88 | Ht 62.0 in | Wt 190.0 lb

## 2023-06-10 DIAGNOSIS — Z712 Person consulting for explanation of examination or test findings: Secondary | ICD-10-CM

## 2023-06-10 MED ORDER — TRANEXAMIC ACID 650 MG PO TABS: 1300.0000 mg | ORAL_TABLET | Freq: Three times a day (TID) | ORAL | 2 refills | Status: DC

## 2023-06-10 NOTE — Progress Notes (Signed)
32 yo P2 returning to discuss ultrasound results. Patient reports improvement in her AUB since her last visit. She denies pelvic pain or abnormal discharge. Patient reports heavy period with passage of clots. Her cycle now seems to last 7-9 days. She is sexually active without contraception. Patient is without any other complaints  Past Medical History:  Diagnosis Date   Asthma    last used 11/29/11   Bulging disc    Chronic back pain    Complication of anesthesia    itching with epidural   Constipation    COVID-19 virus infection 05/2020   Migraine    Morbid obesity (HCC)    Past Surgical History:  Procedure Laterality Date   WISDOM TOOTH EXTRACTION     Family History  Problem Relation Age of Onset   Diabetes Mother    COPD Mother    Heart disease Mother    Cancer Mother        throat cancer   COPD Father    Heart disease Father    Cancer Sister    Cancer Paternal Aunt    Diabetes Maternal Grandmother    Cancer Maternal Grandmother    COPD Paternal Grandmother    Heart disease Paternal Grandfather    Other Neg Hx    Social History   Tobacco Use   Smoking status: Every Day    Current packs/day: 0.50    Types: Cigarettes   Smokeless tobacco: Never  Vaping Use   Vaping status: Never Used  Substance Use Topics   Alcohol use: Yes    Comment: occ   Drug use: Yes    Types: Marijuana   ROS See pertinent in HPI. All other systems reviewed and non contributory Blood pressure 108/77, pulse 88, height 5\' 2"  (1.575 m), weight 190 lb (86.2 kg), last menstrual period 06/08/2023. GENERAL: Well-developed, well-nourished female in no acute distress.  NEURO: alert and oriented x 3  US PELVIC COMPLETE WITH TRANSVAGINAL Result Date: 05/16/2023 : PROCEDURE: US PELVIS COMPLETE WITH TRANSVAGINAL HISTORY: Patient is a 32 y/o F with abnormal uterine bleeding. LMP 05/06/2023. COMPARISON: None. TECHNIQUE: Two-dimensional transabdominal grayscale and color Doppler ultrasound of the  pelvis was performed. Transvaginal was performed. FINDINGS: The uterus is anteverted in position and measures 8.2 x 3.8 x 5.1 cm. It demonstrates a normal, homogeneous echotexture. The endometrium measures 0.4 cm and demonstrates a normal homogeneous echotexture. Nabothian cysts are visualized within the cervix. The right ovary measures 3.0 x 1.9 x 2.5 cm and demonstrates a normal echotexture. There is normal color Doppler flow. The left ovary measures 2.6 x 1.9 x 2.2 cm and demonstrates a normal echotexture. There is normal color Doppler flow. There is no fluid present within the cul-de-sac. IMPRESSION: 1. Unremarkable ultrasound of the pelvis. Thank you for allowing [place] to assist in the care of this patient. Electronically Signed   By: Lestine Box M.D.   On: 05/16/2023 06:48     A/P 32 yo here to discuss ultrasound results - Ultrasound results reviewed with the patient - Patient is uncertain regarding BTL. She is not interested in contraception for cycle control - Rx lysteda provided - RTC prn

## 2023-06-10 NOTE — Progress Notes (Signed)
32 y.o. presents for [place] FU.  Last PAP 05/01/23 [facility]

## 2023-06-12 NOTE — L&D Delivery Note (Signed)
 OB/GYN Faculty Practice Delivery Note  Cassandra Harrison is a 33 y.o. [id number] s/p SVD complicated by shoulder dystocia at 106w3d. She was admitted for IOL. Labour progressed well.   Patient remained afebrile throughout labor process, but was noted to have a temperature of 102.4 after delivery, and baby was also found to have a fever of 101.7. As patient had already delivered, we did not give patient antibiotics but we did treat the fever with 1000mg  tylenol .   ROM: 11h 64m with clear fluid GBS Status: negative  Delivery Date/Time: 10:34pm  Delivery: Called to room and patient was complete and pushing. Head delivered straight occiput anterior. nuchal cord absent. Baby did have a shoulder dystocia that was resolved. Infant with spontaneous cry, placed on mother's abdomen, dried and stimulated. Cord clamped x 2 after 1-minute delay, and cut by father. Cord blood drawn. Placenta delivered with manual removal. Fundus firm with massage and Pitocin . Labia, perineum, vagina, and cervix inspected inspected.   Placenta:  manual removal Intact Complications:maternal fever, shoulder dystocia Lacerations: 1st degree tear posterior perineal area that spontaneously stopped bleeding and did not require any repair, superficial lacerations noted on right and left labia without bleeding and no need for repair  EBL: 300 Analgesia: Epidural  Newborn Data: Birth date:04/04/2024 Birth time:10:34 PM Gender:Female         Lavanda Aline, MD Family Medicine Resident PGY-1 Center for [facility], [facility] 04/04/2024, 11:11 PM

## 2023-06-21 ENCOUNTER — Encounter (HOSPITAL_COMMUNITY): Payer: Self-pay

## 2023-06-21 ENCOUNTER — Ambulatory Visit (HOSPITAL_COMMUNITY)
Admission: EM | Admit: 2023-06-21 | Discharge: 2023-06-24 | Disposition: A | Discharge status: Other Acute Inpatient Hospital | Payer: Medicaid Other | Attending: Psychiatry | Admitting: Psychiatry

## 2023-06-21 DIAGNOSIS — F32A Depression, unspecified: Secondary | ICD-10-CM | POA: Insufficient documentation

## 2023-06-21 DIAGNOSIS — R45851 Suicidal ideations: Secondary | ICD-10-CM | POA: Diagnosis not present

## 2023-06-21 DIAGNOSIS — F1910 Other psychoactive substance abuse, uncomplicated: Secondary | ICD-10-CM

## 2023-06-21 DIAGNOSIS — F39 Unspecified mood [affective] disorder: Secondary | ICD-10-CM

## 2023-06-21 DIAGNOSIS — Z046 Encounter for general psychiatric examination, requested by authority: Secondary | ICD-10-CM

## 2023-06-21 DIAGNOSIS — R451 Restlessness and agitation: Secondary | ICD-10-CM | POA: Insufficient documentation

## 2023-06-21 DIAGNOSIS — F1290 Cannabis use, unspecified, uncomplicated: Secondary | ICD-10-CM | POA: Insufficient documentation

## 2023-06-21 DIAGNOSIS — Z9152 Personal history of nonsuicidal self-harm: Secondary | ICD-10-CM | POA: Insufficient documentation

## 2023-06-21 DIAGNOSIS — F1994 Other psychoactive substance use, unspecified with psychoactive substance-induced mood disorder: Secondary | ICD-10-CM | POA: Diagnosis not present

## 2023-06-21 DIAGNOSIS — Z5902 Unsheltered homelessness: Secondary | ICD-10-CM | POA: Insufficient documentation

## 2023-06-21 MED ORDER — TRAZODONE HCL 50 MG PO TABS
50.0000 mg | ORAL_TABLET | Freq: Every evening | ORAL | Status: DC | PRN
  Administered 2023-06-22 – 2023-06-23 (×2): 50 mg via ORAL
  Filled 2023-06-21 (×3): qty 1

## 2023-06-21 MED ORDER — ACETAMINOPHEN 325 MG PO TABS
650.0000 mg | ORAL_TABLET | Freq: Four times a day (QID) | ORAL | Status: DC | PRN
  Administered 2023-06-23: 650 mg via ORAL
  Filled 2023-06-21: qty 2

## 2023-06-21 MED ORDER — DIPHENHYDRAMINE HCL 50 MG PO CAPS: 50.0000 mg | ORAL_CAPSULE | Freq: Three times a day (TID) | ORAL | Status: DC | PRN

## 2023-06-21 MED ORDER — MAGNESIUM HYDROXIDE 400 MG/5ML PO SUSP: 30.0000 mL | Freq: Every day | ORAL | Status: DC | PRN

## 2023-06-21 MED ORDER — LORAZEPAM 2 MG/ML IJ SOLN
2.0000 mg | Freq: Three times a day (TID) | INTRAMUSCULAR | Status: DC | PRN
  Filled 2023-06-21: qty 1

## 2023-06-21 MED ORDER — LORAZEPAM 1 MG PO TABS
1.0000 mg | ORAL_TABLET | Freq: Once | ORAL | Status: AC
  Administered 2023-06-21: 1 mg via ORAL
  Filled 2023-06-21: qty 1

## 2023-06-21 MED ORDER — HYDROXYZINE HCL 25 MG PO TABS
25.0000 mg | ORAL_TABLET | Freq: Three times a day (TID) | ORAL | Status: DC | PRN
  Administered 2023-06-22 – 2023-06-23 (×2): 25 mg via ORAL
  Filled 2023-06-21 (×4): qty 1

## 2023-06-21 MED ORDER — DIPHENHYDRAMINE HCL 50 MG/ML IJ SOLN
50.0000 mg | Freq: Three times a day (TID) | INTRAMUSCULAR | Status: DC | PRN
  Filled 2023-06-21: qty 1

## 2023-06-21 MED ORDER — DIPHENHYDRAMINE HCL 50 MG/ML IJ SOLN: 50.0000 mg | Freq: Three times a day (TID) | INTRAMUSCULAR | Status: DC | PRN

## 2023-06-21 MED ORDER — HALOPERIDOL 5 MG PO TABS
5.0000 mg | ORAL_TABLET | Freq: Three times a day (TID) | ORAL | Status: DC | PRN
  Administered 2023-06-21: 5 mg via ORAL
  Filled 2023-06-21: qty 1

## 2023-06-21 MED ORDER — LORAZEPAM 2 MG/ML IJ SOLN: 2.0000 mg | Freq: Three times a day (TID) | INTRAMUSCULAR | Status: DC | PRN

## 2023-06-21 MED ORDER — HALOPERIDOL LACTATE 5 MG/ML IJ SOLN
10.0000 mg | Freq: Three times a day (TID) | INTRAMUSCULAR | Status: DC | PRN
  Filled 2023-06-21: qty 2

## 2023-06-21 MED ORDER — HALOPERIDOL LACTATE 5 MG/ML IJ SOLN: 5.0000 mg | Freq: Three times a day (TID) | INTRAMUSCULAR | Status: DC | PRN

## 2023-06-21 MED ORDER — ALUM & MAG HYDROXIDE-SIMETH 200-200-20 MG/5ML PO SUSP: 30.0000 mL | ORAL | Status: DC | PRN

## 2023-06-21 MED ORDER — OLANZAPINE 5 MG PO TBDP
5.0000 mg | ORAL_TABLET | Freq: Every day | ORAL | Status: DC
  Administered 2023-06-22 – 2023-06-23 (×2): 5 mg via ORAL
  Filled 2023-06-21 (×3): qty 1
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
## 2023-06-21 NOTE — ED Notes (Signed)
 Patient resting with eyes closed. Respirations even and unlabored. No acute distress noted. Environment secured. Will continue to monitor for safety.

## 2023-06-21 NOTE — Progress Notes (Addendum)
 Pt has been accepted to [facility] 06/21/2023 Bed assignment: Main campus  Pt meets inpatient criteria per: [employer]   Attending Physician will be Millie Manners, MD  Report can be called to: [phone] (this is a pager, please leave call-back number when giving report)  Pt can arrive TODAY ASAP   Care Team Notified: [employer]yline [employer] RN, Jesus Sung LPN,Lorra Avita [place] RN  [place] Tameya Kuznia LCSW-A   06/21/2023 11:28 AM

## 2023-06-21 NOTE — ED Notes (Signed)
 Pt is shoving furniture, verbally abusive to staff and threatening to "tear shit up if you don't let me leave." Verbal de-escalation attempted but without success.

## 2023-06-21 NOTE — ED Notes (Signed)
 Pt awake she is upset due to her being at the facility it was explained to her about her situation denies SI/HI/AVH denies pain or distress will continue to monitor for safety

## 2023-06-21 NOTE — ED Notes (Signed)
 Pt is sleeping at this time. Pt refused all PO medication earlier. Pt calmed herself so no further intervention is needed at this time.

## 2023-06-21 NOTE — ED Notes (Signed)
 Pt refused blood work , vitals and to talk with anyone. She said she does not want anyone touching her. She is verbally aggressive loudly yelling, and cursing

## 2023-06-21 NOTE — ED Notes (Deleted)
 Pt has been accepted to [facility] TOMORROW 06/22/2023 Bed assignment: Main campus
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
## 2023-06-21 NOTE — ED Notes (Signed)
 Patient A&Ox4. Denies intent to harm self/others when asked. Denies A/VH. Patient denies any physical complaints when asked. No acute distress noted. Support and encouragement provided. Routine safety checks conducted according to facility protocol. Encouraged patient to notify staff if thoughts of harm toward self or others arise. Endorses safety. Patient verbalized understanding and agreement. The patient does not agree to lab draw or EKG. Nurse and provider explained to her the IVC and recommended treatment plan, patient does not agree. Will continue to monitor for safety.

## 2023-06-21 NOTE — Progress Notes (Signed)
 LCSW Progress Note  [id number]   Cassandra Harrison  06/21/2023  11:08 AM  Description:   Inpatient Psychiatric Referral  Patient was recommended inpatient per [facility] NP. There are no available beds at [facility], per [facility] Bretta Qua RN. Patient was referred to the following out of network facilities:   Destination  Service Provider Address Phone Fax  [facility] [address], [place] [address] [phone]  [facility] [address] [place], [place] [address] [phone]  [facility] [address] [place] [address] [phone]  [facility] [address] [place] [address] [phone]  [facility] [address], [place] [address] [phone]  [facility] [address], [place] [address] [phone]  [facility] EFAX [address] [place] [phone]  [facility] [address] [place], [place] [address] [phone]  [facility] [address], [place] [address] [phone]  [facility] [address] [place] [address] [phone]  [facility] [address], [place] [address] [phone]      Situation ongoing, CSW to continue following and update chart as more information becomes available.      [name], MSW, LCSW  06/21/2023 11:08 AM

## 2023-06-21 NOTE — ED Notes (Signed)
 patient becoming more irritable as seen by clenched fists, yelling, and pacing. Please see MAR for medication administration details. Safety maintained. Will continue to monitor for safety.

## 2023-06-21 NOTE — Progress Notes (Addendum)
  CSW was informed that [facility] department will not be providing transport until Monday, However leadership reached out and stated that they will resume on Sunday. At this time patient will not be transported to [facility] due to transport. Providers and staff were made aware.     Addend @ 2:38 pm  CSW made Asberry from Intake at [facility] aware that at this time patient will not be transported to facility due to [facility] office being shut down. Patient will be able to come once transport is available    [place] Tessah Patchen LCSW-A   06/21/2023 2:34 PM

## 2023-06-21 NOTE — ED Notes (Signed)
 Patient becoming more irritable, requesting to leave. Nurse explained the nature of her IVC, states "I don't want to listen". Safety maintained. Will continue to monitor for safety.

## 2023-06-21 NOTE — Discharge Instructions (Addendum)
 Transferred to [facility] emergency department for medical clearance, Dr. Fredderick Phenix accepting MD
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
## 2023-06-21 NOTE — ED Notes (Signed)
 Pt sleeping@this  time breathing even and unlabored will continue to monitor for safety
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
## 2023-06-22 LAB — CBC WITH DIFFERENTIAL/PLATELET
Abs Immature Granulocytes: 0.01 10*3/uL (ref 0.00–0.07)
Basophils Absolute: 0 10*3/uL (ref 0.0–0.1)
Basophils Relative: 0 %
Eosinophils Absolute: 0.2 10*3/uL (ref 0.0–0.5)
Eosinophils Relative: 3 %
HCT: 45.3 % (ref 36.0–46.0)
Hemoglobin: 14.5 g/dL (ref 12.0–15.0)
Immature Granulocytes: 0 %
Lymphocytes Relative: 28 %
Lymphs Abs: 1.6 10*3/uL (ref 0.7–4.0)
MCH: 27.8 pg (ref 26.0–34.0)
MCHC: 32 g/dL (ref 30.0–36.0)
MCV: 86.9 fL (ref 80.0–100.0)
Monocytes Absolute: 0.4 10*3/uL (ref 0.1–1.0)
Monocytes Relative: 7 %
Neutro Abs: 3.6 10*3/uL (ref 1.7–7.7)
Neutrophils Relative %: 62 %
Platelets: 251 10*3/uL (ref 150–400)
RBC: 5.21 MIL/uL — ABNORMAL HIGH (ref 3.87–5.11)
RDW: 13.3 % (ref 11.5–15.5)
WBC: 5.8 10*3/uL (ref 4.0–10.5)
nRBC: 0 % (ref 0.0–0.2)

## 2023-06-22 LAB — COMPREHENSIVE METABOLIC PANEL
ALT: 22 U/L (ref 0–44)
AST: 25 U/L (ref 15–41)
Albumin: 3.6 g/dL (ref 3.5–5.0)
Alkaline Phosphatase: 47 U/L (ref 38–126)
Anion gap: 7 (ref 5–15)
BUN: 8 mg/dL (ref 6–20)
CO2: 26 mmol/L (ref 22–32)
Calcium: 9.6 mg/dL (ref 8.9–10.3)
Chloride: 107 mmol/L (ref 98–111)
Creatinine, Ser: 0.67 mg/dL (ref 0.44–1.00)
GFR, Estimated: >60 mL/min (ref >60)
Glucose, Bld: 95 mg/dL (ref 70–99)
Potassium: 4.7 mmol/L (ref 3.5–5.1)
Sodium: 140 mmol/L (ref 135–145)
Total Bilirubin: 0.6 mg/dL (ref 0.0–1.2)
Total Protein: 6 g/dL — ABNORMAL LOW (ref 6.5–8.1)

## 2023-06-22 LAB — POCT URINE DRUG SCREEN - MANUAL ENTRY (I-SCREEN)
POC Amphetamine UR: POSITIVE — AB
POC Buprenorphine (BUP): NOT DETECTED
POC Cocaine UR: NOT DETECTED
POC Marijuana UR: POSITIVE — AB
POC Methadone UR: POSITIVE — AB
POC Methamphetamine UR: NOT DETECTED
POC Morphine: NOT DETECTED
POC Oxazepam (BZO): NOT DETECTED
POC Oxycodone UR: NOT DETECTED
POC Secobarbital (BAR): NOT DETECTED

## 2023-06-22 NOTE — ED Notes (Signed)
 Pt observed/assessed in recliner sleeping. RR even and unlabored, appearing in no noted distress. Environmental check complete, will continue to monitor for safety
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
## 2023-06-22 NOTE — ED Notes (Signed)
 The patient is sitting in the recliner, watching television. No acute distress noted. Environment is secured. Will continue to monitor for safety.

## 2023-06-22 NOTE — ED Notes (Signed)
 Pt sleeping@this  time breathing even and unlabored will continue to monitor for safety

## 2023-06-22 NOTE — ED Notes (Signed)
 Patient resting with eyes closed. Respirations even and unlabored. No acute distress noted. Environment secured. Will continue to monitor for safety.
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
## 2023-06-23 MED ORDER — HYDROCORTISONE 1 % EX CREA
TOPICAL_CREAM | Freq: Two times a day (BID) | CUTANEOUS | Status: DC
  Filled 2023-06-23: qty 28

## 2023-06-23 NOTE — ED Notes (Signed)
 Patient was provided breakfast

## 2023-06-23 NOTE — ED Notes (Signed)
 Patient resting with eyes closed. Respirations even and unlabored. No acute distress noted. Environment secured. Will continue to monitor for safety.

## 2023-06-23 NOTE — ED Notes (Signed)
 Pt observed/assessed in recliner sleeping. RR even and unlabored, appearing in no noted distress. Environmental check complete, will continue to monitor for safety

## 2023-06-23 NOTE — ED Notes (Addendum)
 Pt observed/assessed in recliner sleeping. RR even and unlabored, appearing in no noted distress. Environmental check complete, will continue to monitor for safety

## 2023-06-23 NOTE — ED Notes (Signed)
 At 1801 KV, MHT reported to writer that a bug was found crawling on the side of the patient's bed that she quickly rolled into her glove and placed in the trash. The writer retrieved the glove and obtained a specimen cup to place the bug in for identification. The bug is brown and flat with a small head and fat body. The patient has a small circular raised reddened area to right upper arm. EVS, AOC, nursing mgr, and DON notified of event. The patient's linen was tightly bagged. The patient had on donated clothing and stated it was ok to discard. The clothing and all other used belongings (cups, spoons) were placed in a tightly sealed bag. EVS has removed the bags from the unit. Security has  tightly bagged the patient's additional belongings and placed in the storage room near the colored lockers. A SZP was placed # [id number]. The provider was made aware of the area to the patient's right upper arm; new order for hydrocortisone  cream placed. The patient was administered the cream to place on arm.

## 2023-06-23 NOTE — ED Notes (Signed)
 Gave report to [employer], Charity fundraiser at [facility].

## 2023-06-23 NOTE — ED Notes (Signed)
 Pt sleeping@this  time breathing even and unlabored will continue to monitor for safety

## 2023-06-23 NOTE — ED Provider Notes (Signed)
 Behavioral Health Progress Note  Date and Time: 06/23/2023 10:27 AM Name: Cassandra Harrison MRN:  [id number]  Subjective:  Patient presented to the [facility] on 06/21/23 under IVC. Patient has a self-reported history of bipolar, anxiety and depression. No psychiatric history documented in patient's EMR.    Patient petitioned by officer, D. Sherlyn [phone]. Per IVC, "I was out on scene for a police call and encountered the respondent. She was stating to me that she did not want to be here anymore, wished she was dead, better off dead and that police kill people so we could solve her problem. I offered to take her to the hospital but she would not consent. She told me that she did not want to be put on medication unless it would kill her. She left her babies' father and 2 children about 6 months ago. She returned back to that same home this past week. She has been aggressive, yelling, cussing. The babies' father advised that except for marijuana he was unsure if she did any other drugs."   On evaluation, patient is sitting upright in the recliner in no acute distress. She is alert and oriented x 4. Her thought process is linear and speech is loud and pressured. Her mood is labile and she presents with irritability and agitation. Patient denies SI. She states that she only said she was suicidal because she was mad and pissed off.  When asked what could she had done differently in that moment, she acknowledges that maybe she should not have said what she said. She states that she does not have the balls to do it (attempt suicide). She denies past suicide attempts. She denies HI/AVH. There is no objective evidence that the patient is responding to internal or external stimuli. She describes her mood as happy.  She reports fair sleep. She reports good appetite. She reports that her daughter's birthday is tomorrow and she's turning 33 years old. She states that she called and spoke to her children  yesterday. She states that she has not spoken to her ex-boyfriend. When asked if this provider could contact her ex boyfriend she declined stating that he is the reason why she is here. Patient compliant with taking scheduled Zyprexa  5 mg po at bedtime. She denies medication side effects. She c/o an anterior headache, aching, 10/10. She was administered tylenol  10/10 for pain. Patient's UDS positive for amphetamine, methadone, and THC. Patient refused to provide substance use history and states that she doesn't use anymore. She is fixated on wanting to leave. When she was advised that the recommendation is inpatient treatment, she became agitated, yelling, and crying. She requested to speak to the psychiatrist. Dr. EMERSON Donath talked with the patient over the phone and in person. However, patient was not receptive. Patient continues to be recommended for inpatient psychiatric treatment. Patient accepted to [facility] pending transportation.   Diagnosis:  Final diagnoses:  Suicidal ideation  Substance induced mood disorder (HCC)  Involuntary commitment  Substance abuse (HCC)    Total Time spent with patient: 30 minutes  Past Psychiatric History: No significant history. Patient denies past inpatient hospitalizations.    Past Medical History: No reported history.    Family Psychiatric  History: Per patient, father dx with schizophrenia.    Social History: Patient is currently homeless. Patient has two children ages 36 and 71 years old. Patient is unemployed. Patient reports occasional marijuana use.   Additional Social History:    Pain Medications: SEE MAR  Prescriptions: SEE MAR Over the Counter: SEE MAR History of alcohol / drug use?: Yes Longest period of sobriety (when/how long): UTA Negative Consequences of Use:  (n/a) Withdrawal Symptoms: None Name of Substance 1: Marijuana 1 - Age of First Use: UTA 1 - Amount (size/oz): unknown 1 - Frequency: not often 1 - Last Use /  Amount: 4 days ago     Current Medications:  Current Facility-Administered Medications  Medication Dose Route Frequency Provider Last Rate Last Admin   acetaminophen  (TYLENOL ) tablet 650 mg  650 mg Oral Q6H PRN McLauchlin, Angela, NP   650 mg at 06/23/23 0927   alum & mag hydroxide-simeth (MAALOX/MYLANTA) 200-200-20 MG/5ML suspension 30 mL  30 mL Oral Q4H PRN McLauchlin, Angela, NP       haloperidol  (HALDOL ) tablet 5 mg  5 mg Oral TID PRN McLauchlin, Angela, NP   5 mg at 06/21/23 1148   And   diphenhydrAMINE  (BENADRYL ) capsule 50 mg  50 mg Oral TID PRN McLauchlin, Jon, NP       haloperidol  lactate (HALDOL ) injection 5 mg  5 mg Intramuscular TID PRN McLauchlin, Angela, NP       And   diphenhydrAMINE  (BENADRYL ) injection 50 mg  50 mg Intramuscular TID PRN McLauchlin, Angela, NP       And   LORazepam  (ATIVAN ) injection 2 mg  2 mg Intramuscular TID PRN McLauchlin, Angela, NP       haloperidol  lactate (HALDOL ) injection 10 mg  10 mg Intramuscular TID PRN McLauchlin, Angela, NP       And   diphenhydrAMINE  (BENADRYL ) injection 50 mg  50 mg Intramuscular TID PRN McLauchlin, Angela, NP       And   LORazepam  (ATIVAN ) injection 2 mg  2 mg Intramuscular TID PRN McLauchlin, Angela, NP       hydrOXYzine  (ATARAX ) tablet 25 mg  25 mg Oral TID PRN McLauchlin, Angela, NP   25 mg at 06/22/23 2134   magnesium  hydroxide (MILK OF MAGNESIA) suspension 30 mL  30 mL Oral Daily PRN McLauchlin, Angela, NP       OLANZapine  zydis (ZYPREXA ) disintegrating tablet 5 mg  5 mg Oral QHS McLauchlin, Angela, NP   5 mg at 06/22/23 2135   traZODone  (DESYREL ) tablet 50 mg  50 mg Oral QHS PRN McLauchlin, Angela, NP   50 mg at 06/22/23 2135   No current outpatient medications on file.    Labs  Lab Results:  Admission on 06/21/2023  Component Date Value Ref Range Status   WBC 06/22/2023 5.8  4.0 - 10.5 K/uL Final   RBC 06/22/2023 5.21 (H)  3.87 - 5.11 MIL/uL Final   Hemoglobin 06/22/2023 14.5  12.0 - 15.0 g/dL Final    HCT 98/88/7974 45.3  36.0 - 46.0 % Final   MCV 06/22/2023 86.9  80.0 - 100.0 fL Final   MCH 06/22/2023 27.8  26.0 - 34.0 pg Final   MCHC 06/22/2023 32.0  30.0 - 36.0 g/dL Final   RDW 98/88/7974 13.3  11.5 - 15.5 % Final   Platelets 06/22/2023 251  150 - 400 K/uL Final   nRBC 06/22/2023 0.0  0.0 - 0.2 % Final   Neutrophils Relative % 06/22/2023 62  % Final   Neutro Abs 06/22/2023 3.6  1.7 - 7.7 K/uL Final   Lymphocytes Relative 06/22/2023 28  % Final   Lymphs Abs 06/22/2023 1.6  0.7 - 4.0 K/uL Final   Monocytes Relative 06/22/2023 7  % Final   Monocytes Absolute 06/22/2023 0.4  0.1 - 1.0 K/uL Final   Eosinophils Relative 06/22/2023 3  % Final   Eosinophils Absolute 06/22/2023 0.2  0.0 - 0.5 K/uL Final   Basophils Relative 06/22/2023 0  % Final   Basophils Absolute 06/22/2023 0.0  0.0 - 0.1 K/uL Final   Immature Granulocytes 06/22/2023 0  % Final   Abs Immature Granulocytes 06/22/2023 0.01  0.00 - 0.07 K/uL Final   Performed at [facility], [address] [place], [place] [address]   Sodium 06/22/2023 140  135 - 145 mmol/L Final   Potassium 06/22/2023 4.7  3.5 - 5.1 mmol/L Final   Chloride 06/22/2023 107  98 - 111 mmol/L Final   CO2 06/22/2023 26  22 - 32 mmol/L Final   Glucose, Bld 06/22/2023 95  70 - 99 mg/dL Final   Glucose reference range applies only to samples taken after fasting for at least 8 hours.   BUN 06/22/2023 8  6 - 20 mg/dL Final   Creatinine, Ser 06/22/2023 0.67  0.44 - 1.00 mg/dL Final   Calcium 98/88/7974 9.6  8.9 - 10.3 mg/dL Final   Total Protein 98/88/7974 6.0 (L)  6.5 - 8.1 g/dL Final   Albumin 98/88/7974 3.6  3.5 - 5.0 g/dL Final   AST 98/88/7974 25  15 - 41 U/L Final   ALT 06/22/2023 22  0 - 44 U/L Final   Alkaline Phosphatase 06/22/2023 47  38 - 126 U/L Final   Total Bilirubin 06/22/2023 0.6  0.0 - 1.2 mg/dL Final   GFR, Estimated 06/22/2023 >60  >60 mL/min Final   Comment: (NOTE) Calculated using the CKD-EPI Creatinine Equation (2021)    Anion  gap 06/22/2023 7  5 - 15 Final   Performed at [facility], [address], [place], [place] [address]   POC Amphetamine UR 06/22/2023 Positive (A)   Final-Edited   POC Secobarbital (BAR) 06/22/2023 None Detected   Final-Edited   POC Buprenorphine (BUP) 06/22/2023 None Detected   Final-Edited   POC Oxazepam (BZO) 06/22/2023 None Detected   Final-Edited   POC Cocaine UR 06/22/2023 None Detected   Final-Edited   POC Methamphetamine UR 06/22/2023 None Detected   Corrected   POC Morphine  06/22/2023 None Detected   Final-Edited   POC Methadone UR 06/22/2023 Positive (A)   Final-Edited   POC Oxycodone  UR 06/22/2023 None Detected   Final-Edited   POC Marijuana UR 06/22/2023 Positive (A)   Final-Edited  Office Visit on 05/01/2023  Component Date Value Ref Range Status   High risk HPV 05/01/2023 Negative   Final   Adequacy 05/01/2023 Satisfactory for evaluation; transformation zone component PRESENT.   Final   Diagnosis 05/01/2023 - Atypical squamous cells of undetermined significance (ASC-US ) (A)   Final   Comment 05/01/2023 Normal Reference Range HPV - Negative   Final   Neisseria Gonorrhea 05/01/2023 Negative   Final   Chlamydia 05/01/2023 Negative   Final   Trichomonas 05/01/2023 Negative   Final   Bacterial Vaginitis (gardnerella) 05/01/2023 Positive (A)   Final   Candida Vaginitis 05/01/2023 Negative   Final   Candida Glabrata 05/01/2023 Negative   Final   Comment 05/01/2023 Normal Reference Range Bacterial Vaginosis - Negative   Final   Comment 05/01/2023 Normal Reference Range Candida Species - Negative   Final   Comment 05/01/2023 Normal Reference Range Candida Galbrata - Negative   Final   Comment 05/01/2023 Normal Reference Range Trichomonas - Negative   Final   Comment 05/01/2023 Normal Reference Ranger  Chlamydia - Negative   Final   Comment 05/01/2023 Normal Reference Range Neisseria Gonorrhea - Negative   Final   HIV Screen 4th Generation wRfx 05/01/2023 Non Reactive  Non  Reactive Final   Comment: HIV-1/HIV-2 antibodies and HIV-1 p24 antigen were NOT detected. There is no laboratory evidence of HIV infection. HIV Negative    RPR Ser Ql 05/01/2023 Non Reactive  Non Reactive Final   Hepatitis B Surface Ag 05/01/2023 Negative  Negative Final   Hep C Virus Ab 05/01/2023 Non Reactive  Non Reactive Final   Comment: HCV antibody alone does not differentiate between previously resolved infection and active infection. Equivocal and Reactive HCV antibody results should be followed up with an HCV RNA test to support the diagnosis of active HCV infection.    WBC 05/01/2023 6.0  3.4 - 10.8 x10E3/uL Final   Comment: **Effective May 13, 2023 profile [id number] WBC will be made**   non-orderable as a stand-alone order code.    RBC 05/01/2023 5.21  3.77 - 5.28 x10E6/uL Final   Hemoglobin 05/01/2023 14.5  11.1 - 15.9 g/dL Final   Hematocrit 88/79/7975 44.3  34.0 - 46.6 % Final   MCV 05/01/2023 85  79 - 97 fL Final   MCH 05/01/2023 27.8  26.6 - 33.0 pg Final   MCHC 05/01/2023 32.7  31.5 - 35.7 g/dL Final   RDW 88/79/7975 13.0  11.7 - 15.4 % Final   Platelets 05/01/2023 236  150 - 450 x10E3/uL Final   DHEA-SO4 05/01/2023 79.8 (L)  84.8 - 378.0 ug/dL Final   hCG Quant 88/79/7975 <1  mIU/mL Final   Comment:                      Female (Non-pregnant)    0 -     5                             (Postmenopausal)  0 -     8                      Female (Pregnant)                      Weeks of Gestation                              3                6 -    71                              4               10 -   750                              5              217 -  7138                              6              158 - 31795  7             [id number] -[id number]                              8            32065 -149571                              9            63803 -151410                             10            46509 -[phone]                             12             27832 -[id number]                             14            13950 - 62530                             15            12039 - 70971                             16             9040 - [phone]                             17             8175 - [phone] Roche E                          CLIA methodology    [facility] 05/01/2023 6.3  mIU/mL Final   Comment:                      Adult Female             Range                       Follicular phase      3.5 -  12.5                       Ovulation phase       4.7 -  21.5                       Luteal phase          1.7 -   7.7  Postmenopausal       25.8 - 134.8    Hgb A1c MFr Bld 05/01/2023 5.5  4.8 - 5.6 % Final   Comment:          Prediabetes: 5.7 - 6.4          Diabetes: >6.4          Glycemic control for adults with diabetes: <7.0    Est. average glucose Bld gHb Est-m* 05/01/2023 111  mg/dL Final   Testosterone  05/01/2023 23  8 - 60 ng/dL Final   Testosterone , Free 05/01/2023 0.5  0.0 - 4.2 pg/mL Final   Prolactin 05/01/2023 19.0  4.8 - 33.4 ng/mL Final   TSH 05/01/2023 1.430  0.450 - 4.500 uIU/mL Final  Admission on 04/22/2023, Discharged on 04/23/2023  Component Date Value Ref Range Status   Preg Test, Ur 04/22/2023 NEGATIVE  NEGATIVE Final   Comment:        THE SENSITIVITY OF THIS METHODOLOGY IS >24 mIU/mL   Admission on 01/30/2023, Discharged on 01/30/2023  Component Date Value Ref Range Status   Preg Test, Ur 01/30/2023 Negative  Negative Final   Color, UA 01/30/2023 yellow  yellow Final   Clarity, UA 01/30/2023 clear  clear Final   Glucose, UA 01/30/2023 negative  negative mg/dL Final   Bilirubin, UA 91/78/7975 negative  negative Final   Ketones, POC UA 01/30/2023 negative  negative mg/dL Final   Spec Grav, UA 91/78/7975 1.020  1.010 - 1.025 Final   Blood, UA 01/30/2023 moderate (A)  negative Final   pH, UA 01/30/2023 7.0  5.0 - 8.0 Final   Protein Ur, POC  01/30/2023 negative  negative mg/dL Final   Urobilinogen, UA 01/30/2023 0.2  0.2 or 1.0 E.U./dL Final   Nitrite, UA 91/78/7975 Negative  Negative Final   Leukocytes, UA 01/30/2023 Moderate (2+) (A)  Negative Final   Bacterial Vaginitis (gardnerella) 01/30/2023 Positive (A)   Final   Candida Vaginitis 01/30/2023 Negative   Final   Candida Glabrata 01/30/2023 Negative   Final   Trichomonas 01/30/2023 Negative   Final   Chlamydia 01/30/2023 Negative   Final   Neisseria Gonorrhea 01/30/2023 Negative   Final   Comment 01/30/2023 Normal Reference Range Bacterial Vaginosis - Negative   Final   Comment 01/30/2023 Normal Reference Range Candida Species - Negative   Final   Comment 01/30/2023 Normal Reference Range Candida Galbrata - Negative   Final   Comment 01/30/2023 Normal Reference Range Trichomonas - Negative   Final   Comment 01/30/2023 Normal Reference Ranger Chlamydia - Negative   Final   Comment 01/30/2023 Normal Reference Range Neisseria Gonorrhea - Negative   Final   Specimen Description 01/30/2023 URINE, CLEAN CATCH   Final   Special Requests 01/30/2023    Final                   Value:NONE Performed at [facility], [address], [place], [place] [address]    Culture 01/30/2023 70,000 COLONIES/mL STAPHYLOCOCCUS SAPROPHYTICUS (A)   Final   Report Status 01/30/2023 02/01/2023 FINAL   Final   Organism ID, Bacteria 01/30/2023 STAPHYLOCOCCUS SAPROPHYTICUS (A)   Final    Blood Alcohol level:  No results found for: [facility]  Metabolic Disorder Labs: Lab Results  Component Value Date   HGBA1C 5.5 05/01/2023   Lab Results  Component Value Date   PROLACTIN 19.0 05/01/2023   No results found for: CHOL, TRIG, HDL, CHOLHDL, VLDL, LDLCALC  Therapeutic Lab Levels: No results  found for: LITHIUM No results found for: VALPROATE No results found for: CBMZ  Physical Findings   Flowsheet Row ED from 06/21/2023 in [facility] ED from  01/30/2023 in [facility] ED from 12/10/2021 in [facility] Emergency Department at [facility]  C-SSRS RISK CATEGORY Low Risk No Risk No Risk        Musculoskeletal  Strength & Muscle Tone: within normal limits Gait & Station: normal Patient leans: N/A  Psychiatric Specialty Exam  Presentation  General Appearance:  Appropriate for Environment  Eye Contact: Good  Speech: Clear and Coherent  Speech Volume: Normal  Handedness: Right   Mood and Affect  Mood: Euphoric  Affect: Appropriate   Thought Process  Thought Processes: Coherent; Linear  Descriptions of Associations:Circumstantial  Orientation:Full (Time, Place and Person)  Thought Content:WDL  Diagnosis of Schizophrenia or Schizoaffective disorder in past: No    Hallucinations:Hallucinations: None  Ideas of Reference:None  Suicidal Thoughts:Suicidal Thoughts: No  Homicidal Thoughts:Homicidal Thoughts: No   Sensorium  Memory: Immediate Good; Recent Good; Remote Good  Judgment: Fair  Insight: Fair   Art Therapist  Concentration: Fair  Attention Span: Good  Recall: Fair  Fund of Knowledge: Good  Language: Good   Psychomotor Activity  Psychomotor Activity: Psychomotor Activity: Normal   Assets  Assets: Communication Skills; Desire for Improvement; Physical Health; Resilience   Sleep  Sleep: Good  Physical Exam  Physical Exam HENT:     Nose: Nose normal.  Cardiovascular:     Rate and Rhythm: Normal rate.  Pulmonary:     Effort: Pulmonary effort is normal.  Musculoskeletal:        General: Normal range of motion.     Cervical back: Normal range of motion.  Neurological:     Mental Status: She is oriented to person, place, and time.    Review of Systems  Constitutional: Negative.   HENT: Negative.    Eyes: Negative.   Respiratory: Negative.    Cardiovascular: Negative.   Gastrointestinal: Negative.    Genitourinary: Negative.   Musculoskeletal: Negative.   Neurological: Negative.   Endo/Heme/Allergies: Negative.    Blood pressure 99/76, pulse 65, temperature 98.2 F (36.8 C), temperature source Oral, resp. rate 18, last menstrual period 06/08/2023, SpO2 100%. There is no height or weight on file to calculate BMI.  Treatment Plan Summary: Patient is recommended for patient inpatient psychiatric treatment for safety and mood stabilization. Patient is under IVC. Pt has been accepted to [facility] today, Main campus pending transportation by the [employer]. CSW made [place] from Intake at [facility] aware that at this time patient will not be transported to facility due to [facility] office being shut down. Patient will be able to come once transport is available    Labs: UDS positive for methadone, THC and amphetamine.   Will add COWS to monitor for opioid withdrawal.    Vital signs are stable.   Medications: continue Zyprexa  5 mg po at bedtime for mood stabilization. Agitation protocol ordered for severe agitation.    Teresa Wyline CROME, NP 06/23/2023 10:27 AM

## 2023-06-23 NOTE — ED Notes (Signed)

## 2023-06-23 NOTE — ED Notes (Signed)
 Pt laying on recliner calm and cooperative no c/o pain or distress alert and orient x 3 denies SI/HI/AVH will continue to monitor for safety

## 2023-06-24 ENCOUNTER — Emergency Department (HOSPITAL_COMMUNITY)
Admission: EM | Admit: 2023-06-24 | Discharge: 2023-06-25 | Payer: Medicaid Other | Attending: Emergency Medicine | Admitting: Emergency Medicine

## 2023-06-24 ENCOUNTER — Encounter (HOSPITAL_COMMUNITY): Payer: Self-pay | Admitting: Emergency Medicine

## 2023-06-24 ENCOUNTER — Emergency Department (HOSPITAL_COMMUNITY): Payer: Medicaid Other

## 2023-06-24 ENCOUNTER — Other Ambulatory Visit: Payer: Self-pay

## 2023-06-24 DIAGNOSIS — F17200 Nicotine dependence, unspecified, uncomplicated: Secondary | ICD-10-CM | POA: Insufficient documentation

## 2023-06-24 DIAGNOSIS — J45909 Unspecified asthma, uncomplicated: Secondary | ICD-10-CM | POA: Diagnosis not present

## 2023-06-24 DIAGNOSIS — R0789 Other chest pain: Secondary | ICD-10-CM | POA: Diagnosis not present

## 2023-06-24 DIAGNOSIS — Z8249 Family history of ischemic heart disease and other diseases of the circulatory system: Secondary | ICD-10-CM | POA: Diagnosis not present

## 2023-06-24 DIAGNOSIS — Z8616 Personal history of COVID-19: Secondary | ICD-10-CM | POA: Diagnosis not present

## 2023-06-24 DIAGNOSIS — R45851 Suicidal ideations: Secondary | ICD-10-CM | POA: Diagnosis present

## 2023-06-24 DIAGNOSIS — F319 Bipolar disorder, unspecified: Secondary | ICD-10-CM | POA: Diagnosis not present

## 2023-06-24 DIAGNOSIS — R200 Anesthesia of skin: Secondary | ICD-10-CM | POA: Diagnosis not present

## 2023-06-24 DIAGNOSIS — R079 Chest pain, unspecified: Secondary | ICD-10-CM | POA: Diagnosis present

## 2023-06-24 DIAGNOSIS — F1910 Other psychoactive substance abuse, uncomplicated: Secondary | ICD-10-CM | POA: Diagnosis present

## 2023-06-24 LAB — CBC
HCT: 43.1 % (ref 36.0–46.0)
Hemoglobin: 14.1 g/dL (ref 12.0–15.0)
MCH: 28.5 pg (ref 26.0–34.0)
MCHC: 32.7 g/dL (ref 30.0–36.0)
MCV: 87.1 fL (ref 80.0–100.0)
Platelets: 218 10*3/uL (ref 150–400)
RBC: 4.95 MIL/uL (ref 3.87–5.11)
RDW: 13 % (ref 11.5–15.5)
WBC: 7.6 10*3/uL (ref 4.0–10.5)
nRBC: 0 % (ref 0.0–0.2)

## 2023-06-24 LAB — BASIC METABOLIC PANEL
Anion gap: 8 (ref 5–15)
BUN: 9 mg/dL (ref 6–20)
CO2: 25 mmol/L (ref 22–32)
Calcium: 9.7 mg/dL (ref 8.9–10.3)
Chloride: 106 mmol/L (ref 98–111)
Creatinine, Ser: 0.75 mg/dL (ref 0.44–1.00)
GFR, Estimated: >60 mL/min (ref >60)
Glucose, Bld: 103 mg/dL — ABNORMAL HIGH (ref 70–99)
Potassium: 4.6 mmol/L (ref 3.5–5.1)
Sodium: 139 mmol/L (ref 135–145)

## 2023-06-24 LAB — TROPONIN I (HIGH SENSITIVITY)
Troponin I (High Sensitivity): <2 ng/L (ref <18)
Troponin I (High Sensitivity): <2 ng/L (ref <18)

## 2023-06-24 LAB — HCG, SERUM, QUALITATIVE: Preg, Serum: NEGATIVE

## 2023-06-24 MED ORDER — HYDROCORTISONE 1 % EX CREA: TOPICAL_CREAM | Freq: Two times a day (BID) | CUTANEOUS | Status: DC

## 2023-06-24 MED ORDER — ACETAMINOPHEN 325 MG PO TABS
975.0000 mg | ORAL_TABLET | Freq: Once | ORAL | Status: AC
  Administered 2023-06-24: 975 mg via ORAL
  Filled 2023-06-24: qty 3

## 2023-06-24 MED ORDER — OLANZAPINE 5 MG PO TBDP: 5.0000 mg | ORAL_TABLET | Freq: Every day | ORAL | Status: DC

## 2023-06-24 NOTE — ED Notes (Signed)
 911 called for ETA on EMS. 911 rep unable to give eta to this clinical research associate. This clinical research associate went to [facility] and asked if they could get an ETA on EMS. 911 call center placed call as non emergent when emergent line was called. [employer] able to get an ETA on EMS. Will continue to monitor for safety and report any COC.

## 2023-06-24 NOTE — Progress Notes (Signed)
 Pt is resting quietly. No distress noted or concerns voiced. Staff will monitor for pt's safety. ?

## 2023-06-24 NOTE — ED Notes (Signed)
 EMS & [employer] here to transport to [facility]. Pt a/o, left with EMS on stretcher. Safety maintained.

## 2023-06-24 NOTE — ED Notes (Signed)
 Attempted to call report x 1 to [facility], no answer.

## 2023-06-24 NOTE — ED Triage Notes (Signed)
 Patient BIB [facility] under IVC from [facility] c/o chest pain and headache that started when she was crying over missing her daughter's birthday.  Patient also endorses nausea with it.  Patient tearful in triage. VSS.  EMS reports bed bug bites as well on patient's arm after she reportedly found bed bugs in her bed.    108/56 68 HR 100% RA

## 2023-06-24 NOTE — ED Notes (Signed)
 ED Provider at bedside.

## 2023-06-24 NOTE — Discharge Instructions (Addendum)
 You were seen in the emergency department for chest pain.  Test performed while you are here included chest x-ray, EKG, blood work which were reassuring against any emergent or life-threatening causes of your symptoms.  You can take Tylenol  and ibuprofen  as needed for your symptoms.  If you experience significantly increased pain, loss of consciousness, heart palpitations, or any other concerns, return to the ED for reevaluation.

## 2023-06-24 NOTE — ED Notes (Signed)
 [employer] here to transport. Waiting for EMS arrival to transport to [facility].

## 2023-06-24 NOTE — ED Notes (Signed)
 Pt sitting up in bed at the current. Resident denies HI, SI, & AVH. Denies pain or any discomfort at this time. No s/s of acute distress observed. VSS. Safety maintained. Will continue to monitor and report any COC.

## 2023-06-24 NOTE — ED Notes (Signed)
 Sheriff dept called for transport to [facility] pt was accepted on Friday but due to bad weather Sheriff dept was not transferring until Monday so I called back and put her on the list

## 2023-06-24 NOTE — Discharge Summary (Addendum)
 Rollo JINNY Byers to be D/C'd  to [facility]  per NP order. Discussed with the patient and all questions fully answered. An After Visit Summary and EMTALA were printed and given to the patient. All belongings returned. Patient escorted out, and D/C home via [employer]. Due to IVC status.   Update: Sheriff dept refused  transport pt due to discrepancy on the dates signed by the judge and the officer that served the [employer]. Pt is currently on the unit. Mardy, NP notified. No new orders.   Dorla Jung  06/24/2023 9:47 AM

## 2023-06-24 NOTE — ED Notes (Signed)
 Spoke with Sgt. Pennrod with [employer] ride confirmed to [facility] for this morning. Report called to Pervis Hocking RN @ [facility]. IVC paper faxed to [phone]. Will continue to monitor and report any COC.

## 2023-06-24 NOTE — ED Notes (Addendum)
 [facility] called for transport. Will continue to monitor and report any COC.

## 2023-06-24 NOTE — Progress Notes (Signed)
 Carrie/ intake from [facility] called and reported that the Sheriffs reported to them that the patient has/ had bed bugs. At this time facilites has rescinded the bed offer. CSW has informed providers.   [place] Jennifer Holland LCSW-A   06/24/2023 10:27 AM

## 2023-06-24 NOTE — ED Notes (Signed)
 IVC:  ENVELOPE # [id number]  3 COPIES ON CLIP BOARD IN BLUE 1 COPY IN MEDICAL RECORDS ORIGINAL IN RED FOLDER

## 2023-06-24 NOTE — Progress Notes (Signed)
 LCSW Progress Note  [id number]   Cassandra Harrison  06/24/2023  10:54 AM  Description:   Inpatient Psychiatric Referral  Patient was recommended inpatient per Nash Batter NP. There are no available beds at [facility], per [facility] Cherylynn Ernst RN. Patient was referred to the following out of network facilities:  Destination  Service Provider Request Status Services Address Phone Fax Patient Preferred  [facility] Pending - Request Sent -- [address], [place] [address] [phone] --  [facility] Pending - Request Sent -- [address] [place] [address] [phone] --  [facility] Pending - Request Sent -- [address] [place] [address] [phone] --  [facility] Pending - Request Sent -- [address] [place] [address] [phone] --  [facility] Pending - Request Sent -- [address], [place] [address] [phone] --  [facility] Pending - Request Sent -- [address] [place] [address] [phone] --  [facility] Pending - Request Sent -- [address] [place] [phone] --  [facility] Pending - Request Sent -- [address] [name] [address] [phone] --  [facility] Pending - Request Sent -- [address], [place] [address] [phone] --  [facility] Pending - Request Sent -- [address] [place] [address] [phone] --  [facility] Pending - Request Sent -- [address] [place] [address] [phone]        Situation ongoing, CSW to continue following and update chart as more information  becomes available.      [name], MSW, LCSW  06/24/2023 10:54 AM

## 2023-06-24 NOTE — ED Provider Notes (Addendum)
 FBC/OBS ASAP Discharge Summary  Date and Time: 06/24/2023 8:46 AM  Name: Cassandra Harrison  MRN:  [id number]   Discharge Diagnoses:  Final diagnoses:  Suicidal ideation  Substance induced mood disorder (HCC)  Involuntary commitment  Substance abuse (HCC)  HPI: Patient presented to the [facility] on 06/21/23 under IVC. Patient has a self-reported history of bipolar, anxiety and depression. No psychiatric history documented in patient's EMR.  Patient recommended for inpatient psychiatric admission and has remained on the unit while awaiting bed availability.  Patient petitioned by officer, D. Sherlyn [phone]. Per IVC, "I was out on scene for a police call and encountered the respondent. She was stating to me that she did not want to be here anymore, wished she was dead, better off dead and that police kill people so we could solve her problem. I offered to take her to the hospital but she would not consent. She told me that she did not want to be put on medication unless it would kill her. She left her babies' father and 2 children about 6 months ago. She returned back to that same home this past week. She has been aggressive, yelling, cussing. The babies' father advised that except for marijuana he was unsure if she did any other drugs."   Subjective:   8:46 amOn today's assessment patient is observed laying in her bed asleep.  She is easily awakened.  She is disheveled and irritable.  She is alert/oriented x 4, and fairly attentive.  Her speech is clear, coherent, at a normal rate and time.  She appears anxious.  She denies findings in the IVC.  She endorses depression related to her current situation.  She has a dysphoric affect.  She denies any concerns with appetite or sleep.  She denies SI/HI/AVH.  She does not appear to be responding to internal/external stimuli.  At this time she does not appear manic, psychotic, or paranoid.   Stay Summary:   Patient continues to request to leave.  She  does not believe she needs to be admitted to the psychiatric hospital.  Provided reassurance, encouragement, and support.  Discussed with patient due to the safety concerns presented by law enforcement and findings and IVC and that  she has been recommended for inpatient treatment.  She is reluctant but is agreeable and expresses her concerns that she does not want to be in the facility for more than a few days.  Patient recommended for inpatient psychiatric admission.  Patient was originally accepted to [facility]. Notified by SW that [facility] rescinded bed due to bedbug being found on patient during admission.  Social work notified and patient will be faxed out and seek inpatient psychiatric bed availability.   5:55 PM-notified by nursing that patient is complaining of chest pain.  Upon assessment patient is complaining of chest pressure with a pain of 8 out of 10.  She is tearful.  She is also complaining of radiating pain and numbness going down her left arm that goes into her fingers.  Reports this started about 10 minutes ago.  She is denying being short of breath at this time and vital signs are WNL.  Total Time spent with patient: 20 minutes  Past Psychiatric History: see H&P Past Medical History: see H&P Family History: see H&P Family Psychiatric History: see H&P Social History: see H&P Tobacco Cessation:  N/A, patient does not currently use tobacco products  Current Medications:  Current Facility-Administered Medications  Medication Dose Route Frequency Provider  Last Rate Last Admin   acetaminophen  (TYLENOL ) tablet 650 mg  650 mg Oral Q6H PRN McLauchlin, Jon, NP   650 mg at 06/23/23 0927   alum & mag hydroxide-simeth (MAALOX/MYLANTA) 200-200-20 MG/5ML suspension 30 mL  30 mL Oral Q4H PRN McLauchlin, Angela, NP       haloperidol  (HALDOL ) tablet 5 mg  5 mg Oral TID PRN McLauchlin, Angela, NP   5 mg at 06/21/23 1148   And   diphenhydrAMINE  (BENADRYL ) capsule 50 mg  50 mg Oral  TID PRN McLauchlin, Jon, NP       haloperidol  lactate (HALDOL ) injection 5 mg  5 mg Intramuscular TID PRN McLauchlin, Jon, NP       And   diphenhydrAMINE  (BENADRYL ) injection 50 mg  50 mg Intramuscular TID PRN McLauchlin, Jon, NP       And   LORazepam  (ATIVAN ) injection 2 mg  2 mg Intramuscular TID PRN McLauchlin, Jon, NP       haloperidol  lactate (HALDOL ) injection 10 mg  10 mg Intramuscular TID PRN McLauchlin, Angela, NP       And   diphenhydrAMINE  (BENADRYL ) injection 50 mg  50 mg Intramuscular TID PRN McLauchlin, Angela, NP       And   LORazepam  (ATIVAN ) injection 2 mg  2 mg Intramuscular TID PRN McLauchlin, Angela, NP       hydrocortisone  cream 1 %   Topical BID White, Patrice L, NP   Given at 06/23/23 2130   hydrOXYzine  (ATARAX ) tablet 25 mg  25 mg Oral TID PRN McLauchlin, Angela, NP   25 mg at 06/23/23 2130   magnesium  hydroxide (MILK OF MAGNESIA) suspension 30 mL  30 mL Oral Daily PRN McLauchlin, Angela, NP       OLANZapine  zydis (ZYPREXA ) disintegrating tablet 5 mg  5 mg Oral QHS McLauchlin, Angela, NP   5 mg at 06/23/23 2130   traZODone  (DESYREL ) tablet 50 mg  50 mg Oral QHS PRN McLauchlin, Angela, NP   50 mg at 06/23/23 2130   Current Outpatient Medications  Medication Sig Dispense Refill   hydrocortisone  cream 1 % Apply topically 2 (two) times daily.     OLANZapine  zydis (ZYPREXA ) 5 MG disintegrating tablet Take 1 tablet (5 mg total) by mouth at bedtime.      PTA Medications:  Facility Ordered Medications  Medication   [COMPLETED] LORazepam  (ATIVAN ) tablet 1 mg   acetaminophen  (TYLENOL ) tablet 650 mg   alum & mag hydroxide-simeth (MAALOX/MYLANTA) 200-200-20 MG/5ML suspension 30 mL   magnesium  hydroxide (MILK OF MAGNESIA) suspension 30 mL   hydrOXYzine  (ATARAX ) tablet 25 mg   traZODone  (DESYREL ) tablet 50 mg   haloperidol  (HALDOL ) tablet 5 mg   And   diphenhydrAMINE  (BENADRYL ) capsule 50 mg   haloperidol  lactate (HALDOL ) injection 5 mg   And    diphenhydrAMINE  (BENADRYL ) injection 50 mg   And   LORazepam  (ATIVAN ) injection 2 mg   haloperidol  lactate (HALDOL ) injection 10 mg   And   diphenhydrAMINE  (BENADRYL ) injection 50 mg   And   LORazepam  (ATIVAN ) injection 2 mg   OLANZapine  zydis (ZYPREXA ) disintegrating tablet 5 mg   hydrocortisone  cream 1 %   PTA Medications  Medication Sig   hydrocortisone  cream 1 % Apply topically 2 (two) times daily.   OLANZapine  zydis (ZYPREXA ) 5 MG disintegrating tablet Take 1 tablet (5 mg total) by mouth at bedtime.        No data to display  Flowsheet Row ED from 06/21/2023 in [facility] ED from 01/30/2023 in [facility] at [facility] ED from 12/10/2021 in [facility] Department at [facility]  C-SSRS RISK CATEGORY Low Risk No Risk No Risk       Musculoskeletal  Strength & Muscle Tone: within normal limits Gait & Station: normal Patient leans: N/A  Psychiatric Specialty Exam  Presentation  General Appearance:  Disheveled  Eye Contact: Good  Speech: Clear and Coherent; Normal Rate  Speech Volume: Normal  Handedness: Right   Mood and Affect  Mood: Anxious; Irritable  Affect: Congruent   Thought Process  Thought Processes: Coherent  Descriptions of Associations:Intact  Orientation:Full (Time, Place and Person)  Thought Content:Logical  Diagnosis of Schizophrenia or Schizoaffective disorder in past: No    Hallucinations:Hallucinations: None  Ideas of Reference:None  Suicidal Thoughts:Suicidal Thoughts: No  Homicidal Thoughts:Homicidal Thoughts: No   Sensorium  Memory: Immediate Good; Recent Good; Remote Good  Judgment: Fair  Insight: Fair   Art Therapist  Concentration: Fair  Attention Span: Fair  Recall: Good  Fund of Knowledge: Good  Language: Good   Psychomotor Activity  Psychomotor Activity: Psychomotor Activity: Normal   Assets   Assets: Communication Skills; Desire for Improvement; Financial Resources/Insurance; Physical Health; Resilience; Social Support; Housing; Intimacy   Sleep  Sleep: Sleep: Good   No data recorded  Physical Exam  Physical Exam Vitals reviewed.  Constitutional:      Appearance: Normal appearance.  Eyes:     General:        Right eye: No discharge.        Left eye: No discharge.  Cardiovascular:     Rate and Rhythm: Bradycardia present.     Comments: HR 58 Pulmonary:     Effort: Pulmonary effort is normal. No respiratory distress.  Musculoskeletal:        General: Normal range of motion.     Cervical back: Normal range of motion.  Skin:    Coloration: Skin is not jaundiced or pale.  Neurological:     Mental Status: She is alert and oriented to person, place, and time.  Psychiatric:        Attention and Perception: Attention and perception normal.        Mood and Affect: Mood is anxious.        Speech: Speech normal.        Behavior: Behavior is agitated.        Thought Content: Thought content normal.        Cognition and Memory: Cognition normal.        Judgment: Judgment normal.    Review of Systems  Constitutional: Negative.  Negative for chills and fever.  HENT:  Negative for hearing loss.   Eyes: Negative.   Respiratory:  Negative for cough and shortness of breath.   Cardiovascular:  Negative for chest pain.  Gastrointestinal:  Negative for nausea and vomiting.  Musculoskeletal: Negative.   Neurological: Negative.  Negative for speech change, seizures and headaches.  Psychiatric/Behavioral:  The patient is nervous/anxious.    Blood pressure 100/72, pulse (!) 58, temperature 97.9 F (36.6 C), temperature source Oral, resp. rate 16, last menstrual period 06/08/2023, SpO2 100%. There is no height or weight on file to calculate BMI.    Disposition:   Patient to be transported to [facility] ED for medical clearance due to chest pain and radiating left arm  pain/numbness.  Dr. Lenor at [facility] accepting MD  Patient  may return to [facility] once medically cleared.    Elveria VEAR Batter, NP 06/24/2023, 8:46 AM

## 2023-06-24 NOTE — ED Notes (Signed)
 Pt c/o chest pain and L arm numbness. Provider notified and advised to send to [facility].

## 2023-06-24 NOTE — ED Notes (Signed)
 [employer] called to transport pt back to [facility]

## 2023-06-24 NOTE — ED Notes (Signed)
 Pt sleeping@this  time breathing even and unlabored will continue to monitor for safety

## 2023-06-24 NOTE — ED Provider Notes (Signed)
 [place] EMERGENCY DEPARTMENT AT [facility] Provider Note  MDM   HPI/ROS:  Cassandra Harrison is a 33 y.o. female with pertinent past medical history of bipolar disorder, anxiety, depression who presents chest pain.  Patient reports onset of left-sided chest pain radiating down her left arm with some associated intermittent left upper extremity numbness at 630 today.  She is currently at [facility] urgent care under IVC due to reported SI, but was sent to [facility] ED for further evaluation of chest pain.  Patient denies any dyspnea, fevers, cough, congestion, lower extremity swelling.  She does note that her mom had an MI in her 30s or 59s.  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  She does smoke tobacco and denies any other substance use.  Physical exam is notable for: - Normal cardiopulmonary exam  On my initial evaluation, patient is:  -Vital signs stable. Patient afebrile, hemodynamically stable, and non-toxic appearing. -Additional history obtained from review of prior records  Based on this patient's current presentation, including their history and physical exam, my initial differential diagnosis includes ACS, musculoskeletal chest pain, panic attack.  Very low suspicion for PE; patient low risk by Wells criteria and PERC negative.  Considered acute aortic syndrome given report of chest pain and left arm numbness, however currently with normal neurologic exam and equal pulses, and very unlikely given age and lack of other risk factors.  Do not feel presentation consistent with pericarditis, myocarditis.   Interpretations, interventions, and the patient's course of care are documented below.   -EKG with sinus bradycardia, no acute ischemic changes, no significant changes from prior -Labs with negative troponin x 2, no leukocytosis, normal creatinine and electrolytes, negative hCG -Chest x-ray without evidence of pneumonia, pneumothorax, mediastinal widening -On reevaluation,  patient resting comfortably with slight bradycardia while asleep, but otherwise normal vital signs and without complaints at this time -Plan for discharge back to [facility] urgent care under continued IVC  Disposition:  Transfer to [facility] Care  Clinical Impression:  1. Chest pain, unspecified type     Rx / DC Orders ED Discharge Orders     None       The plan for this patient was discussed with Dr. Freddi, who voiced agreement and who oversaw evaluation and treatment of this patient.   Clinical Complexity A medically appropriate history, review of systems, and physical exam was performed.  My independent interpretations of EKG, labs, and radiology are documented in the ED course above.   If decision rules were used in this patient's evaluation, they are listed below.  HEART score 1  Patient's presentation is most consistent with acute presentation with potential threat to life or bodily function.  Medical Decision Making Amount and/or Complexity of Data Reviewed Labs: ordered.  Risk OTC drugs.    HPI/ROS      See MDM section for pertinent HPI and ROS. A complete ROS was performed with pertinent positives/negatives noted above.   Past Medical History:  Diagnosis Date   Asthma    last used 11/29/11   Bulging disc    Chronic back pain    Complication of anesthesia    itching with epidural   Constipation    COVID-19 virus infection 05/2020   Migraine    Morbid obesity [facility])     Past Surgical History:  Procedure Laterality Date   WISDOM TOOTH EXTRACTION        Physical Exam   Vitals:   06/24/23 1932 06/24/23 1946  BP:  110/76  Pulse:  60  Resp:  16  Temp:  98.8 F (37.1 C)  TempSrc:  Oral  SpO2:  100%  Weight: 86.2 kg     Physical Exam Gen: NAD. Appears comfortable HENT: Conjunctiva clear, PERRL, EOMI. MMM.  CV: RRR. No M/R/G Pulm: Lungs CTAB with no wheezing, rales, or rhonchi.  GI: Abdomen soft, non-tender, non-distended. Normal bowel  sounds in all 4 quadrants. MSK/Skin: No lower extremity edema. Extremities warm, well-perfused with 2+ pulses in all 4 extremities. Neuro: A&Ox3. GCS 15.  5/5 strength in bilateral upper and lower extremities with sensation intact to light touch throughout    Procedures   If procedures were preformed on this patient, they are listed below:  Procedures   Laverda Rimes, MD Emergency Medicine PGY-2   Please note that this documentation was produced with the assistance of voice-to-text technology and may contain errors.    Rimes Laverda, MD 06/25/23 9972    Freddi Hamilton, MD 06/28/23 [phone]

## 2023-06-25 ENCOUNTER — Ambulatory Visit (HOSPITAL_COMMUNITY)
Admission: EM | Admit: 2023-06-25 | Discharge: 2023-06-25 | Disposition: A | Discharge status: Other Acute Inpatient Hospital | Payer: Medicaid Other | Source: Home / Self Care

## 2023-06-25 DIAGNOSIS — F1910 Other psychoactive substance abuse, uncomplicated: Secondary | ICD-10-CM

## 2023-06-25 DIAGNOSIS — Z046 Encounter for general psychiatric examination, requested by authority: Secondary | ICD-10-CM

## 2023-06-25 DIAGNOSIS — R45851 Suicidal ideations: Secondary | ICD-10-CM | POA: Insufficient documentation

## 2023-06-25 DIAGNOSIS — Z8249 Family history of ischemic heart disease and other diseases of the circulatory system: Secondary | ICD-10-CM | POA: Insufficient documentation

## 2023-06-25 DIAGNOSIS — R0789 Other chest pain: Secondary | ICD-10-CM | POA: Insufficient documentation

## 2023-06-25 DIAGNOSIS — F319 Bipolar disorder, unspecified: Secondary | ICD-10-CM | POA: Insufficient documentation

## 2023-06-25 DIAGNOSIS — R200 Anesthesia of skin: Secondary | ICD-10-CM | POA: Insufficient documentation

## 2023-06-25 MED ORDER — ACETAMINOPHEN 325 MG PO TABS: 650.0000 mg | ORAL_TABLET | Freq: Four times a day (QID) | ORAL | Status: DC | PRN

## 2023-06-25 MED ORDER — ALUM & MAG HYDROXIDE-SIMETH 200-200-20 MG/5ML PO SUSP: 30.0000 mL | ORAL | Status: DC | PRN

## 2023-06-25 MED ORDER — MAGNESIUM HYDROXIDE 400 MG/5ML PO SUSP: 30.0000 mL | Freq: Every day | ORAL | Status: DC | PRN

## 2023-06-25 MED ORDER — OLANZAPINE 10 MG IM SOLR: 5.0000 mg | Freq: Three times a day (TID) | INTRAMUSCULAR | Status: DC | PRN

## 2023-06-25 MED ORDER — OLANZAPINE 10 MG IM SOLR: 10.0000 mg | Freq: Three times a day (TID) | INTRAMUSCULAR | Status: DC | PRN

## 2023-06-25 MED ORDER — OLANZAPINE 5 MG PO TBDP: 5.0000 mg | ORAL_TABLET | Freq: Three times a day (TID) | ORAL | Status: DC | PRN

## 2023-06-25 NOTE — ED Notes (Signed)
 [employer] at bedside to transfer patient back to [facility].

## 2023-06-25 NOTE — Progress Notes (Signed)
 Pt was accepted to [facility] 06/25/2023; Bed Assignment Main [facility] Fax Number: [phone] (Adult)/ [phone]   Pt meets inpatient criteria per Elveria Mardy PIETY  Attending Physician will be Dr. Millie Manners   Report can be called to:[phone]-Pager number, please leave a returned phone number to receive a phone call back.   Pt can arrive after 9:00am   Care Team notified: Seria McLaughlin,RN  Mitzie GEANNIE Pinal, MSW, [facility] 06/25/2023 1:51 AM

## 2023-06-25 NOTE — ED Notes (Signed)
 Patient has been brought back on the unit. Patient is now lying in bed with eyes closed in no acute distress. Will continue to monitor patient for safety

## 2023-06-25 NOTE — ED Provider Notes (Signed)
 FBC/OBS ASAP Discharge Summary  Date and Time: 06/25/2023 8:22 AM  Name: Cassandra Harrison  MRN:  [id number]   Discharge Diagnoses:  Final diagnoses:  Substance abuse [facility])  Involuntary commitment   HPI: Patient presented to the [facility] on 06/21/23 under IVC. Patient has a self-reported history of bipolar, anxiety and depression. No psychiatric history documented in patient's EMR.  Patient recommended for inpatient psychiatric admission and has remained on the unit while awaiting bed availability.    Patient petitioned by officer, D. Sherlyn [phone]. Per IVC, "I was out on scene for a police call and encountered the respondent. She was stating to me that she did not want to be here anymore, wished she was dead, better off dead and that police kill people so we could solve her problem. I offered to take her to the hospital but she would not consent. She told me that she did not want to be put on medication unless it would kill her. She left her babies' father and 2 children about 6 months ago. She returned back to that same home this past week. She has been aggressive, yelling, cussing. The babies' father advised that except for marijuana he was unsure if she did any other drugs."   Patient was scheduled to transport [facility] on 06/24/2023.  However when officers arrived to transport they report patient had bedbugs on her clothing and refused transport.  Subsequently [facility] rescinded bed.  Of note on 06/24/2023 5:55 pm patient began having chest pain.  She was sent to [facility] ED for medical clearance.  Once medically cleared patient was sent back to continuous assessment unit.  Patient seen face-to-face by this provider, chart reviewed, and case consulted with Dr. Cambronne on 06/12/2023  Subjective:   On today's assessment patient is observed sitting in her bed awake.  She is disheveled and irritable upon approach.  She appears anxious.  She continues to endorse depression and has a depressed  affect.  She continues to deny SI/HI/AVH.  She denies any concerns with appetite or sleep.  She does not appear to be responding to internal/external stimuli.  Discussed with patient that [facility] has again offered a bed for psychiatric admission.  She is reluctant but in agreement.  Stay Summary:   Patient will be transported to [facility] via [employer].  Total Time spent with patient: 20 minutes  Past Psychiatric History: see H&P Past Medical History: see H&P Family History: see H&P Family Psychiatric History: see H&P Social History: see H&P Tobacco Cessation:  N/A, patient does not currently use tobacco products  Current Medications:  Current Facility-Administered Medications  Medication Dose Route Frequency Provider Last Rate Last Admin   acetaminophen  (TYLENOL ) tablet 650 mg  650 mg Oral Q6H PRN Trudy Carwin, NP       alum & mag hydroxide-simeth (MAALOX/MYLANTA) 200-200-20 MG/5ML suspension 30 mL  30 mL Oral Q4H PRN Trudy Carwin, NP       magnesium  hydroxide (MILK OF MAGNESIA) suspension 30 mL  30 mL Oral Daily PRN Trudy Carwin, NP       OLANZapine  (ZYPREXA ) injection 10 mg  10 mg Intramuscular TID PRN Trudy Carwin, NP       OLANZapine  (ZYPREXA ) injection 5 mg  5 mg Intramuscular TID PRN Trudy Carwin, NP       OLANZapine  zydis (ZYPREXA ) disintegrating tablet 5 mg  5 mg Oral TID PRN Trudy Carwin, NP       Current Outpatient Medications  Medication Sig  Dispense Refill   hydrocortisone  cream 1 % Apply topically 2 (two) times daily.     OLANZapine  zydis (ZYPREXA ) 5 MG disintegrating tablet Take 1 tablet (5 mg total) by mouth at bedtime.      PTA Medications:  Facility Ordered Medications  Medication   acetaminophen  (TYLENOL ) tablet 650 mg   alum & mag hydroxide-simeth (MAALOX/MYLANTA) 200-200-20 MG/5ML suspension 30 mL   magnesium  hydroxide (MILK OF MAGNESIA) suspension 30 mL   OLANZapine  zydis (ZYPREXA ) disintegrating tablet 5 mg   OLANZapine  (ZYPREXA ) injection 5  mg   OLANZapine  (ZYPREXA ) injection 10 mg   PTA Medications  Medication Sig   hydrocortisone  cream 1 % Apply topically 2 (two) times daily.   OLANZapine  zydis (ZYPREXA ) 5 MG disintegrating tablet Take 1 tablet (5 mg total) by mouth at bedtime.        No data to display          Flowsheet Row ED from 06/25/2023 in [facility] ED from 06/24/2023 in [facility] Department [facility] ED from 06/21/2023 in [facility]  C-SSRS RISK CATEGORY No Risk No Risk Low Risk       Musculoskeletal  Strength & Muscle Tone: within normal limits Gait & Station: normal Patient leans: N/A  Psychiatric Specialty Exam  Presentation  General Appearance:  Casual  Eye Contact: Good  Speech: Clear and Coherent  Speech Volume: Normal  Handedness: Right   Mood and Affect  Mood: Anxious  Affect: Congruent   Thought Process  Thought Processes: Coherent  Descriptions of Associations:Intact  Orientation:Full (Time, Place and Person)  Thought Content:Logical  Diagnosis of Schizophrenia or Schizoaffective disorder in past: No    Hallucinations:Hallucinations: None  Ideas of Reference:None  Suicidal Thoughts:Suicidal Thoughts: No  Homicidal Thoughts:Homicidal Thoughts: No   Sensorium  Memory: Immediate Good  Judgment: Fair  Insight: Fair   Art Therapist  Concentration: Fair  Attention Span: Fair  Recall: Good  Fund of Knowledge: Good  Language: Good   Psychomotor Activity  Psychomotor Activity: Psychomotor Activity: Normal   Assets  Assets: Communication Skills; Desire for Improvement; Resilience   Sleep  Sleep: Sleep: Good Number of Hours of Sleep: 8   Nutritional Assessment (For OBS and FBC admissions only) Has the patient had a weight loss or gain of 10 pounds or more in the last 3 months?: No Has the patient had a decrease in food intake/or  appetite?: No Does the patient have dental problems?: No Does the patient have eating habits or behaviors that may be indicators of an eating disorder including binging or inducing vomiting?: No Has the patient recently lost weight without trying?: 0 Has the patient been eating poorly because of a decreased appetite?: 0 Malnutrition Screening Tool Score: 0    Physical Exam  Physical Exam Constitutional:      Appearance: Normal appearance.  Eyes:     General:        Right eye: No discharge.        Left eye: No discharge.  Cardiovascular:     Rate and Rhythm: Normal rate.  Pulmonary:     Effort: Pulmonary effort is normal. No respiratory distress.  Musculoskeletal:        General: Normal range of motion.     Cervical back: Normal range of motion.  Neurological:     Mental Status: She is alert and oriented to person, place, and time.  Psychiatric:        Attention  and Perception: Perception normal.        Mood and Affect: Mood is anxious and depressed.        Speech: Speech normal.        Behavior: Behavior is agitated. Behavior is cooperative.        Thought Content: Thought content normal.        Cognition and Memory: Cognition normal.        Judgment: Judgment normal.    Review of Systems  Constitutional: Negative.  Negative for chills and fever.  HENT:  Negative for hearing loss.   Respiratory:  Negative for cough and shortness of breath.   Cardiovascular:  Negative for chest pain.  Musculoskeletal: Negative.   Neurological: Negative.   Psychiatric/Behavioral:  Positive for depression. The patient is nervous/anxious.    Blood pressure 98/82, pulse (!) 56, temperature 98 F (36.7 C), resp. rate 16, last menstrual period 06/08/2023, SpO2 100%. There is no height or weight on file to calculate BMI.   Disposition:   Discharge and transport patient to [facility] for inpatient psychiatric admission.  Elveria VEAR Batter, NP 06/25/2023, 8:22 AM

## 2023-06-25 NOTE — Progress Notes (Signed)
 LCSW Progress Note  [id number]   Cassandra Harrison  06/25/2023  12:59 AM    Inpatient Behavioral Health Placement  Pt meets inpatient criteria per [facility]. There are no available beds within [facility] system per [facility] Cherylynn Ernst, RN. Referral was sent to the following facilities;   Destination  Service Provider Address Phone Fax  [facility] [address] [place], [place] [address] [phone]  [facility], [place] [phone]  [facility] [address] [place], [place] [address] [phone]  [facility] [address], [place] [address] [phone]  [facility] [address] [place], [place] [address] [phone]  [facility] [address] [place] [address] [phone]  [facility] [address] [place] [address] [phone]  [facility] [address], [place] [address] [phone]  [facility] [address] [place] [address] [phone]  [facility] [address] [place] [address] [phone]  [facility] [address] [place] [address] [phone]  [facility] [address] [place] [address] [phone]  [facility] EFAX [address] [place] [phone]  [facility] [address], [place] [address] [phone]  [facility] [address] [place] [address] [phone]  [facility] [address] [place] [address] [phone]  [facility] [address], [place] [address] [phone]  [facility] [address] [name] [address] [phone]  [facility] [address] [place] [address] [phone]  [facility] [place] [phone]  [facility] [address] [place] [address] [phone]  [facility] [address] [place] [address] [phone]  [facility] [address], [place] [address] [phone]  [facility] [address], [place] [address] [phone]    Situation ongoing,  CSW will follow up.    Cassandra Harrison, MSW, [facility] 06/25/2023 12:59 AM

## 2023-06-25 NOTE — ED Provider Notes (Signed)
 [facility] Continuous Assessment Admission H&P  Date: 06/25/23 Patient Name: Cassandra Harrison MRN: [id number] Chief Complaint: suicidal ideation   Diagnoses:  Final diagnoses:  Substance abuse [facility])  Involuntary commitment    HPI: Cassandra Harrison,  33 y/o female with a history of bipolar disorder, anxiety, depression patient was sent out to the ED because of complaint of chest pain.  Patient was medically cleared by ED physicians.  Please see ED notes from [place]: Patient is still currently on the IVC.  Copied from ED notes: Cassandra Harrison is a 33 y.o. female with pertinent past medical history of bipolar disorder, anxiety, depression who presents chest pain.  Patient reports onset of left-sided chest pain radiating down her left arm with some associated intermittent left upper extremity numbness at 630 today.  She is currently at [facility] urgent care under IVC due to reported SI, but was sent to [facility] ED for further evaluation of chest pain.  Patient denies any dyspnea, fevers, cough, congestion, lower extremity swelling.  She does note that her mom had an MI in her 30s or 16s.  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  She does smoke tobacco and denies any other substance use.   Physical exam is notable for: - Normal cardiopulmonary exam   On my initial evaluation, patient is:  -Vital signs stable. Patient afebrile, hemodynamically stable, and non-toxic appearing. -Additional history obtained from review of prior records   Based on this patient's current presentation, including their history and physical exam, my initial differential diagnosis includes ACS, musculoskeletal chest pain, panic attack.  Very low suspicion for PE; patient low risk by Wells criteria and PERC negative.  Considered acute aortic syndrome given report of chest pain and left arm numbness, however currently with normal neurologic exam and equal pulses, and very unlikely given age and lack of other risk  factors.  Do not feel presentation consistent with pericarditis, myocarditis.   Face-to-face evaluation of patient, patient is alert and oriented x 4, speech is clear, maintaining eye contact.  When asked if she is feeling better patient stated yes and denies any current chest pain at this time.  Patient to resume admission GC-BHUC units.  Patient currently denies SI, HI, AVH or paranoia.  At this time patient does not appear to be manic of any paranoia.  Recommend continuous observation  Total Time spent with patient: 15 minutes  Musculoskeletal  Strength & Muscle Tone: within normal limits Gait & Station: normal Patient leans: N/A  Psychiatric Specialty Exam  Presentation General Appearance:  Casual  Eye Contact: Good  Speech: Clear and Coherent  Speech Volume: Normal  Handedness: Right   Mood and Affect  Mood: Anxious  Affect: Congruent   Thought Process  Thought Processes: Coherent  Descriptions of Associations:Intact  Orientation:Full (Time, Place and Person)  Thought Content:Logical  Diagnosis of Schizophrenia or Schizoaffective disorder in past: No   Hallucinations:Hallucinations: None  Ideas of Reference:None  Suicidal Thoughts:Suicidal Thoughts: No  Homicidal Thoughts:Homicidal Thoughts: No   Sensorium  Memory: Immediate Good  Judgment: Fair  Insight: Fair   Art Therapist  Concentration: Fair  Attention Span: Fair  Recall: Good  Fund of Knowledge: Good  Language: Good   Psychomotor Activity  Psychomotor Activity: Psychomotor Activity: Normal   Assets  Assets: Communication Skills; Desire for Improvement; Resilience   Sleep  Sleep: Sleep: Good Number of Hours of Sleep: 8   Nutritional Assessment (For OBS and FBC admissions only) Has the patient had a  weight loss or gain of 10 pounds or more in the last 3 months?: No Has the patient had a decrease in food intake/or appetite?: No Does the patient  have dental problems?: No Does the patient have eating habits or behaviors that may be indicators of an eating disorder including binging or inducing vomiting?: No Has the patient recently lost weight without trying?: 0 Has the patient been eating poorly because of a decreased appetite?: 0 Malnutrition Screening Tool Score: 0    Physical Exam HENT:     Head: Normocephalic.     Nose: Nose normal.  Eyes:     Pupils: Pupils are equal, round, and reactive to light.  Cardiovascular:     Rate and Rhythm: Normal rate.  Pulmonary:     Effort: Pulmonary effort is normal.  Musculoskeletal:        General: Normal range of motion.     Cervical back: Normal range of motion.  Skin:    General: Skin is warm.  Neurological:     General: No focal deficit present.     Mental Status: She is alert.  Psychiatric:        Mood and Affect: Mood normal.        Behavior: Behavior normal.        Thought Content: Thought content normal.        Judgment: Judgment normal.    Review of Systems  Constitutional: Negative.   HENT: Negative.    Eyes: Negative.   Respiratory: Negative.    Cardiovascular: Negative.   Gastrointestinal: Negative.   Genitourinary: Negative.   Musculoskeletal: Negative.   Skin: Negative.   Neurological: Negative.   Psychiatric/Behavioral:  Positive for depression, substance abuse and suicidal ideas.     Blood pressure 92/67, pulse 60, temperature 97.6 F (36.4 C), temperature source Oral, resp. rate 18, last menstrual period 06/08/2023, SpO2 100%. There is no height or weight on file to calculate BMI.  Past Psychiatric History: Suicidal ideation, substance-induced mood disorder,  Is the patient at risk to self? Yes  Has the patient been a risk to self in the past 6 months? Yes .    Has the patient been a risk to self within the distant past? Yes   Is the patient a risk to others? Yes   Has the patient been a risk to others in the past 6 months? No   Has the patient  been a risk to others within the distant past? No   Past Medical History: See chart  Family History: unknown  Social History: see h&P  Last Labs:  Admission on 06/24/2023, Discharged on 06/25/2023  Component Date Value Ref Range Status   Sodium 06/24/2023 139  135 - 145 mmol/L Final   Potassium 06/24/2023 4.6  3.5 - 5.1 mmol/L Final   Chloride 06/24/2023 106  98 - 111 mmol/L Final   CO2 06/24/2023 25  22 - 32 mmol/L Final   Glucose, Bld 06/24/2023 103 (H)  70 - 99 mg/dL Final   Glucose reference range applies only to samples taken after fasting for at least 8 hours.   BUN 06/24/2023 9  6 - 20 mg/dL Final   Creatinine, Ser 06/24/2023 0.75  0.44 - 1.00 mg/dL Final   Calcium 98/86/7974 9.7  8.9 - 10.3 mg/dL Final   GFR, Estimated 06/24/2023 >60  >60 mL/min Final   Comment: (NOTE) Calculated using the CKD-EPI Creatinine Equation (2021)    Anion gap 06/24/2023 8  5 - 15 Final  Performed at [facility], [address], [place], [place] [address]   WBC 06/24/2023 7.6  4.0 - 10.5 K/uL Final   RBC 06/24/2023 4.95  3.87 - 5.11 MIL/uL Final   Hemoglobin 06/24/2023 14.1  12.0 - 15.0 g/dL Final   HCT 98/86/7974 43.1  36.0 - 46.0 % Final   MCV 06/24/2023 87.1  80.0 - 100.0 fL Final   MCH 06/24/2023 28.5  26.0 - 34.0 pg Final   MCHC 06/24/2023 32.7  30.0 - 36.0 g/dL Final   RDW 98/86/7974 13.0  11.5 - 15.5 % Final   Platelets 06/24/2023 218  150 - 400 K/uL Final   nRBC 06/24/2023 0.0  0.0 - 0.2 % Final   Performed at [facility], [address], [place], [place] [address]   Troponin I (High Sensitivity) 06/24/2023 <2  <18 ng/L Final   Comment: (NOTE) Elevated high sensitivity troponin I (hsTnI) values and significant  changes across serial measurements may suggest ACS but many other  chronic and acute conditions are known to elevate hsTnI results.  Refer to the Links section for chest pain algorithms and additional  guidance. Performed at [facility], [address], [place], [place] [address]    Preg, Serum 06/24/2023 NEGATIVE  NEGATIVE Final   Comment:        THE SENSITIVITY OF THIS METHODOLOGY IS >10 mIU/mL. Performed at [facility], [address], [place], [place] [address]    Troponin I (High Sensitivity) 06/24/2023 <2  <18 ng/L Final   Comment: (NOTE) Elevated high sensitivity troponin I (hsTnI) values and significant  changes across serial measurements may suggest ACS but many other  chronic and acute conditions are known to elevate hsTnI results.  Refer to the Links section for chest pain algorithms and additional  guidance. Performed at [facility], [address] [place], [place] [address]   Admission on 06/21/2023, Discharged on 06/24/2023  Component Date Value Ref Range Status   WBC 06/22/2023 5.8  4.0 - 10.5 K/uL Final   RBC 06/22/2023 5.21 (H)  3.87 - 5.11 MIL/uL Final   Hemoglobin 06/22/2023 14.5  12.0 - 15.0 g/dL Final   HCT 98/88/7974 45.3  36.0 - 46.0 % Final   MCV 06/22/2023 86.9  80.0 - 100.0 fL Final   MCH 06/22/2023 27.8  26.0 - 34.0 pg Final   MCHC 06/22/2023 32.0  30.0 - 36.0 g/dL Final   RDW 98/88/7974 13.3  11.5 - 15.5 % Final   Platelets 06/22/2023 251  150 - 400 K/uL Final   nRBC 06/22/2023 0.0  0.0 - 0.2 % Final   Neutrophils Relative % 06/22/2023 62  % Final   Neutro Abs 06/22/2023 3.6  1.7 - 7.7 K/uL Final   Lymphocytes Relative 06/22/2023 28  % Final   Lymphs Abs 06/22/2023 1.6  0.7 - 4.0 K/uL Final   Monocytes Relative 06/22/2023 7  % Final   Monocytes Absolute 06/22/2023 0.4  0.1 - 1.0 K/uL Final   Eosinophils Relative 06/22/2023 3  % Final   Eosinophils Absolute 06/22/2023 0.2  0.0 - 0.5 K/uL Final   Basophils Relative 06/22/2023 0  % Final   Basophils Absolute 06/22/2023 0.0  0.0 - 0.1 K/uL Final   Immature Granulocytes 06/22/2023 0  % Final   Abs Immature Granulocytes 06/22/2023 0.01  0.00 - 0.07 K/uL Final   Performed at [facility], [address], [place], [place]  [address]  Sodium 06/22/2023 140  135 - 145 mmol/L Final   Potassium 06/22/2023 4.7  3.5 - 5.1 mmol/L Final   Chloride 06/22/2023 107  98 - 111 mmol/L Final   CO2 06/22/2023 26  22 - 32 mmol/L Final   Glucose, Bld 06/22/2023 95  70 - 99 mg/dL Final   Glucose reference range applies only to samples taken after fasting for at least 8 hours.   BUN 06/22/2023 8  6 - 20 mg/dL Final   Creatinine, Ser 06/22/2023 0.67  0.44 - 1.00 mg/dL Final   Calcium 98/88/7974 9.6  8.9 - 10.3 mg/dL Final   Total Protein 98/88/7974 6.0 (L)  6.5 - 8.1 g/dL Final   Albumin 98/88/7974 3.6  3.5 - 5.0 g/dL Final   AST 98/88/7974 25  15 - 41 U/L Final   ALT 06/22/2023 22  0 - 44 U/L Final   Alkaline Phosphatase 06/22/2023 47  38 - 126 U/L Final   Total Bilirubin 06/22/2023 0.6  0.0 - 1.2 mg/dL Final   GFR, Estimated 06/22/2023 >60  >60 mL/min Final   Comment: (NOTE) Calculated using the CKD-EPI Creatinine Equation (2021)    Anion gap 06/22/2023 7  5 - 15 Final   Performed at [facility], [address], [place], [place] [address]   POC Amphetamine UR 06/22/2023 Positive (A)   Final-Edited   POC Secobarbital (BAR) 06/22/2023 None Detected   Final-Edited   POC Buprenorphine (BUP) 06/22/2023 None Detected   Final-Edited   POC Oxazepam (BZO) 06/22/2023 None Detected   Final-Edited   POC Cocaine UR 06/22/2023 None Detected   Final-Edited   POC Methamphetamine UR 06/22/2023 None Detected   Corrected   POC Morphine  06/22/2023 None Detected   Final-Edited   POC Methadone UR 06/22/2023 Positive (A)   Final-Edited   POC Oxycodone  UR 06/22/2023 None Detected   Final-Edited   POC Marijuana UR 06/22/2023 Positive (A)   Final-Edited  Office Visit on 05/01/2023  Component Date Value Ref Range Status   High risk HPV 05/01/2023 Negative   Final   Adequacy 05/01/2023 Satisfactory for evaluation; transformation zone component PRESENT.   Final   Diagnosis 05/01/2023 - Atypical squamous cells of undetermined significance  (ASC-US ) (A)   Final   Comment 05/01/2023 Normal Reference Range HPV - Negative   Final   Neisseria Gonorrhea 05/01/2023 Negative   Final   Chlamydia 05/01/2023 Negative   Final   Trichomonas 05/01/2023 Negative   Final   Bacterial Vaginitis (gardnerella) 05/01/2023 Positive (A)   Final   Candida Vaginitis 05/01/2023 Negative   Final   Candida Glabrata 05/01/2023 Negative   Final   Comment 05/01/2023 Normal Reference Range Bacterial Vaginosis - Negative   Final   Comment 05/01/2023 Normal Reference Range Candida Species - Negative   Final   Comment 05/01/2023 Normal Reference Range Candida Galbrata - Negative   Final   Comment 05/01/2023 Normal Reference Range Trichomonas - Negative   Final   Comment 05/01/2023 Normal Reference Ranger Chlamydia - Negative   Final   Comment 05/01/2023 Normal Reference Range Neisseria Gonorrhea - Negative   Final   HIV Screen 4th Generation wRfx 05/01/2023 Non Reactive  Non Reactive Final   Comment: HIV-1/HIV-2 antibodies and HIV-1 p24 antigen were NOT detected. There is no laboratory evidence of HIV infection. HIV Negative    RPR Ser Ql 05/01/2023 Non Reactive  Non Reactive Final   Hepatitis B Surface Ag 05/01/2023 Negative  Negative Final   Hep C Virus Ab  05/01/2023 Non Reactive  Non Reactive Final   Comment: HCV antibody alone does not differentiate between previously resolved infection and active infection. Equivocal and Reactive HCV antibody results should be followed up with an HCV RNA test to support the diagnosis of active HCV infection.    WBC 05/01/2023 6.0  3.4 - 10.8 x10E3/uL Final   Comment: **Effective May 13, 2023 profile [id number] WBC will be made**   non-orderable as a stand-alone order code.    RBC 05/01/2023 5.21  3.77 - 5.28 x10E6/uL Final   Hemoglobin 05/01/2023 14.5  11.1 - 15.9 g/dL Final   Hematocrit 88/79/7975 44.3  34.0 - 46.6 % Final   MCV 05/01/2023 85  79 - 97 fL Final   MCH 05/01/2023 27.8  26.6 - 33.0 pg Final   MCHC  05/01/2023 32.7  31.5 - 35.7 g/dL Final   RDW 88/79/7975 13.0  11.7 - 15.4 % Final   Platelets 05/01/2023 236  150 - 450 x10E3/uL Final   DHEA-SO4 05/01/2023 79.8 (L)  84.8 - 378.0 ug/dL Final   hCG Quant 88/79/7975 <1  mIU/mL Final   Comment:                      Female (Non-pregnant)    0 -     5                             (Postmenopausal)  0 -     8                      Female (Pregnant)                      Weeks of Gestation                              3                6 -    71                              4               10 -   750                              5              217 -  7138                              6              158 - 31795                              7             3697 -836436                              8            32065 -[phone]  9            [id number] -[id number]                             10            46509 -813022                             12            27832 -210612                             14            13950 - 62530                             15            12039 - 70971                             16             9040 - 56451                             17             [phone] - [phone] Roche E                          CLIA methodology    [facility] 05/01/2023 6.3  mIU/mL Final   Comment:                      Adult Female             Range                       Follicular phase      3.5 -  12.5                       Ovulation phase       4.7 -  21.5                       Luteal phase          1.7 -   7.7                       Postmenopausal       25.8 - 134.8    Hgb A1c MFr Bld 05/01/2023 5.5  4.8 - 5.6 % Final   Comment:          Prediabetes: 5.7 - 6.4          Diabetes: >6.4          Glycemic control for adults with diabetes: <7.0    Est. average glucose Bld gHb Est-m* 05/01/2023 111  mg/dL Final   Testosterone  05/01/2023 23  8 - 60 ng/dL Final   Testosterone , Free  05/01/2023 0.5  0.0 - 4.2 pg/mL Final   Prolactin 05/01/2023 19.0  4.8 - 33.4 ng/mL Final   TSH 05/01/2023 1.430  0.450 - 4.500 uIU/mL Final  Admission on 04/22/2023, Discharged on 04/23/2023  Component Date Value Ref Range Status   Preg Test, Ur 04/22/2023 NEGATIVE  NEGATIVE Final   Comment:        THE SENSITIVITY OF THIS METHODOLOGY IS >24 mIU/mL   Admission on 01/30/2023, Discharged on 01/30/2023  Component Date Value Ref Range Status   Preg Test, Ur 01/30/2023 Negative  Negative Final   Color, UA 01/30/2023 yellow  yellow Final   Clarity, UA 01/30/2023 clear  clear Final   Glucose, UA 01/30/2023 negative  negative mg/dL Final   Bilirubin, UA 91/78/7975 negative  negative Final   Ketones, POC UA 01/30/2023 negative  negative mg/dL Final   Spec Grav, UA 91/78/7975 1.020  1.010 - 1.025 Final   Blood, UA 01/30/2023 moderate (A)  negative Final   pH, UA 01/30/2023 7.0  5.0 - 8.0 Final   Protein Ur, POC 01/30/2023 negative  negative mg/dL Final   Urobilinogen, UA 01/30/2023 0.2  0.2 or 1.0 E.U./dL Final   Nitrite, UA 91/78/7975 Negative  Negative Final   Leukocytes, UA 01/30/2023 Moderate (2+) (A)  Negative Final   Bacterial Vaginitis (gardnerella) 01/30/2023 Positive (A)   Final   Candida Vaginitis 01/30/2023 Negative   Final   Candida Glabrata 01/30/2023 Negative   Final   Trichomonas 01/30/2023 Negative   Final   Chlamydia 01/30/2023 Negative   Final   Neisseria Gonorrhea 01/30/2023 Negative   Final   Comment 01/30/2023 Normal Reference Range Bacterial Vaginosis - Negative   Final   Comment 01/30/2023 Normal Reference Range Candida Species - Negative   Final   Comment 01/30/2023 Normal Reference Range Candida Galbrata - Negative   Final   Comment 01/30/2023 Normal Reference Range Trichomonas - Negative   Final   Comment 01/30/2023 Normal Reference Ranger Chlamydia - Negative   Final   Comment 01/30/2023 Normal Reference Range Neisseria Gonorrhea - Negative   Final   Specimen  Description 01/30/2023 URINE, CLEAN CATCH   Final   Special Requests 01/30/2023    Final                   Value:NONE Performed at [facility], [address] [place], [place] [address]    Culture 01/30/2023 70,000 COLONIES/mL STAPHYLOCOCCUS SAPROPHYTICUS (A)   Final   Report Status 01/30/2023 02/01/2023 FINAL   Final   Organism ID, Bacteria 01/30/2023 STAPHYLOCOCCUS SAPROPHYTICUS (A)   Final    Allergies: Olive oil and Latex  Medications:  Facility Ordered Medications  Medication   acetaminophen  (TYLENOL ) tablet 650 mg   alum & mag hydroxide-simeth (MAALOX/MYLANTA) 200-200-20 MG/5ML suspension 30 mL   magnesium  hydroxide (MILK OF MAGNESIA) suspension 30 mL   OLANZapine  zydis (ZYPREXA ) disintegrating tablet 5 mg   OLANZapine  (ZYPREXA ) injection 5 mg   OLANZapine  (ZYPREXA ) injection 10 mg   PTA Medications  Medication Sig   hydrocortisone  cream 1 % Apply topically 2 (two) times daily.   OLANZapine  zydis (ZYPREXA ) 5 MG disintegrating tablet Take 1 tablet (5 mg total) by mouth at bedtime.      Medical Decision Making  Inpatient observation    Recommendations  Based on my evaluation the patient does not appear to have an emergency medical condition.  Gaither Pouch, NP 06/25/23  5:27 AM

## 2023-06-25 NOTE — ED Notes (Signed)
 Cassandra Harrison with [facility] called to get report on patient, report was given and this nursed advised Cassandra Harrison that patient should be arriving this morning after 9 am.

## 2023-06-25 NOTE — Discharge Instructions (Addendum)
 igned      Pt was accepted to [facility] 06/25/2023; Bed Assignment Main [facility] Fax Number: [phone] (Adult)/ [phone]    Pt meets inpatient criteria per Elveria Mardy PIETY   Attending Physician will be Dr. Millie Manners    Report can be called to:[phone]-Pager number, please leave a returned phone number to receive a phone call back.

## 2023-06-25 NOTE — ED Notes (Signed)
 This nurse called [facility] at [phone] to give report on this patient as she will be transferred this morning after 9 am, no answer HIPAA Compliant message was left with call back number.

## 2023-06-25 NOTE — ED Notes (Signed)
 Pt is currently sleeping, no distress noted, environmental check complete, will continue to monitor patient for safety.

## 2023-06-25 NOTE — ED Notes (Signed)
 Patient will be going to [facility] for continuity of care this morning after 9 am. [facility] transport dept has been called and a message has been left for transportation for IVC patient to [facility].

## 2023-06-25 NOTE — ED Notes (Signed)
 Patient being transferred to [facility] via sheriff. Patient was stable and denied si/hi/avh at time of transfer. All belongings collected and sent with patient. Safety maintained

## 2023-06-25 NOTE — ED Notes (Signed)
 Patient observed/assessed in room in bed appearing in no immediate distress resting peacefully. Q15 minute checks continued by [facility] and nursing staff. Will continue to monitor and support.

## 2023-06-25 NOTE — ED Notes (Signed)
 Patient A&Ox4. Denies intent to harm self/others when asked. Denies A/VH. Patient denies any physical complaints when asked. No acute distress noted. Support and encouragement provided. Routine safety checks conducted according to facility protocol. Encouraged patient to notify staff if thoughts of harm toward self or others arise. Patient verbalize understanding and agreement. Will continue to monitor for safety.

## 2023-11-06 ENCOUNTER — Other Ambulatory Visit (HOSPITAL_COMMUNITY)
Admission: RE | Admit: 2023-11-06 | Discharge: 2023-11-06 | Disposition: A | Discharge status: Home / Self Care | Source: Ambulatory Visit | Attending: Obstetrics and Gynecology | Admitting: Obstetrics and Gynecology

## 2023-11-06 ENCOUNTER — Other Ambulatory Visit (INDEPENDENT_AMBULATORY_CARE_PROVIDER_SITE_OTHER): Payer: MEDICAID

## 2023-11-06 ENCOUNTER — Ambulatory Visit (INDEPENDENT_AMBULATORY_CARE_PROVIDER_SITE_OTHER): Payer: MEDICAID | Admitting: *Deleted

## 2023-11-06 VITALS — BP 103/71 | HR 83 | Wt 228.0 lb

## 2023-11-06 DIAGNOSIS — O0990 Supervision of high risk pregnancy, unspecified, unspecified trimester: Secondary | ICD-10-CM | POA: Insufficient documentation

## 2023-11-06 DIAGNOSIS — Z3A17 17 weeks gestation of pregnancy: Secondary | ICD-10-CM | POA: Diagnosis not present

## 2023-11-06 DIAGNOSIS — Z362 Encounter for other antenatal screening follow-up: Secondary | ICD-10-CM

## 2023-11-06 DIAGNOSIS — Z1331 Encounter for screening for depression: Secondary | ICD-10-CM | POA: Diagnosis not present

## 2023-11-06 DIAGNOSIS — O0992 Supervision of high risk pregnancy, unspecified, second trimester: Secondary | ICD-10-CM | POA: Diagnosis not present

## 2023-11-06 DIAGNOSIS — Z3482 Encounter for supervision of other normal pregnancy, second trimester: Secondary | ICD-10-CM

## 2023-11-06 MED ORDER — BLOOD PRESSURE KIT DEVI: 1.0000 | 0 refills | Status: DC

## 2023-11-06 MED ORDER — VITAFOL GUMMIES 3.33-0.333-34.8 MG PO CHEW: 3.0000 | CHEWABLE_TABLET | Freq: Every day | ORAL | 11 refills | Status: DC

## 2023-11-06 NOTE — Addendum Note (Signed)
 Addended by: Rehmat Murtagh M on: 11/06/2023 05:26 PM   Modules accepted: Orders

## 2023-11-06 NOTE — Progress Notes (Signed)
 New OB Intake  I connected with Cassandra Harrison  on 11/06/23 at  1:10 PM EDT by In Person Visit and verified that I am speaking with the correct person using two identifiers. Nurse is located at [facility] and pt is located at [facility].  I discussed the limitations, risks, security and privacy concerns of performing an evaluation and management service by telephone and the availability of in person appointments. I also discussed with the patient that there may be a patient responsible charge related to this service. The patient expressed understanding and agreed to proceed.  I explained I am completing New OB Intake today. We discussed EDD of Not found.. Pt is [id number]. I reviewed her allergies, medications and Medical/Surgical/OB history.    Patient Active Problem List   Diagnosis Date Noted   Sprain of anterior talofibular ligament of left ankle 12/14/2021   Smoker within last 12 months 08/02/2020   Female stress incontinence 08/02/2020   Asthma    Morbid obesity (HCC)    COVID-19 virus infection 05/2020     Concerns addressed today  Delivery Plans Plans to deliver at [facility] Discussed the nature of our practice with multiple providers including residents and students. Due to the size of the practice, the delivering provider may not be the same as those providing prenatal care.   Patient is not interested in water birth.  MyChart/Babyscripts MyChart access verified. I explained pt will have some visits in office and some virtually. Babyscripts instructions given and order placed. Patient verifies receipt of registration text/e-mail. Account successfully created and app downloaded. If patient is a candidate for Optimized scheduling, add to sticky note.   Blood Pressure Cuff/Weight Scale Blood pressure cuff ordered for patient to pick-up from [employer]. Explained after first prenatal appt pt will check weekly and document in Babyscripts. Patient does not have weight scale; patient  may purchase if they desire to track weight weekly in Babyscripts.  Anatomy US  Explained first scheduled US  will be around 19 weeks. Anatomy US  scheduled for TBD at TBD.  Is patient a candidate for Babyscripts Optimization? No, due to Risk Factors   First visit review I reviewed new OB appt with patient. Explained pt will be seen by Dr. April Knack at first visit. Discussed [facility] [place] genetic screening with patient. Requests Panorama. Routine prenatal labs OB Urine, GC/CC only collected at today's visit. Initial OB labs scheduled for 11/07/23. Pt not well hydrated today.  Last Pap Diagnosis  Date Value Ref Range Status  05/01/2023 (A)  Final   - Atypical squamous cells of undetermined significance (ASC-US )    Cassandra Furlong, RN 11/06/2023  1:20 PM

## 2023-11-06 NOTE — Patient Instructions (Signed)

## 2023-11-07 ENCOUNTER — Other Ambulatory Visit

## 2023-11-07 LAB — CERVICOVAGINAL ANCILLARY ONLY
Bacterial Vaginitis (gardnerella): NEGATIVE
Candida Glabrata: NEGATIVE
Candida Vaginitis: NEGATIVE
Chlamydia: NEGATIVE
Comment: NEGATIVE
Comment: NEGATIVE
Comment: NEGATIVE
Comment: NEGATIVE
Comment: NEGATIVE
Comment: NORMAL
Neisseria Gonorrhea: NEGATIVE
Trichomonas: NEGATIVE

## 2023-11-08 LAB — CBC/D/PLT+RPR+RH+ABO+RUBIGG...
Antibody Screen: NEGATIVE
Basophils Absolute: 0 10*3/uL (ref 0.0–0.2)
Basos: 0 %
EOS (ABSOLUTE): 0.1 10*3/uL (ref 0.0–0.4)
Eos: 1 %
HCV Ab: NONREACTIVE
HIV Screen 4th Generation wRfx: NONREACTIVE
Hematocrit: 37.4 % (ref 34.0–46.6)
Hemoglobin: 12.1 g/dL (ref 11.1–15.9)
Hepatitis B Surface Ag: NEGATIVE
Immature Grans (Abs): 0 10*3/uL (ref 0.0–0.1)
Immature Granulocytes: 0 %
Lymphocytes Absolute: 1.3 10*3/uL (ref 0.7–3.1)
Lymphs: 18 %
MCH: 28.6 pg (ref 26.6–33.0)
MCHC: 32.4 g/dL (ref 31.5–35.7)
MCV: 88 fL (ref 79–97)
Monocytes Absolute: 0.4 10*3/uL (ref 0.1–0.9)
Monocytes: 6 %
Neutrophils Absolute: 5.6 10*3/uL (ref 1.4–7.0)
Neutrophils: 75 %
Platelets: 206 10*3/uL (ref 150–450)
RBC: 4.23 x10E6/uL (ref 3.77–5.28)
RDW: 13 % (ref 11.7–15.4)
RPR Ser Ql: NONREACTIVE
Rh Factor: NEGATIVE
Rubella Antibodies, IGG: 5.23 {index} (ref >0.99)
WBC: 7.5 10*3/uL (ref 3.4–10.8)

## 2023-11-08 LAB — HEMOGLOBIN A1C
Est. average glucose Bld gHb Est-mCnc: 91 mg/dL
Hgb A1c MFr Bld: 4.8 % (ref 4.8–5.6)

## 2023-11-08 LAB — URINE CULTURE, OB REFLEX

## 2023-11-08 LAB — HCV INTERPRETATION

## 2023-11-08 LAB — CULTURE, OB URINE

## 2023-11-11 ENCOUNTER — Ambulatory Visit: Payer: Self-pay | Admitting: Obstetrics and Gynecology

## 2023-11-11 DIAGNOSIS — O0990 Supervision of high risk pregnancy, unspecified, unspecified trimester: Secondary | ICD-10-CM

## 2023-11-13 LAB — PANORAMA PRENATAL TEST FULL PANEL:PANORAMA TEST PLUS 5 ADDITIONAL MICRODELETIONS: FETAL FRACTION: 5.7

## 2023-11-18 ENCOUNTER — Encounter: Payer: Self-pay | Admitting: Obstetrics

## 2023-11-18 ENCOUNTER — Ambulatory Visit (INDEPENDENT_AMBULATORY_CARE_PROVIDER_SITE_OTHER): Payer: MEDICAID | Admitting: Obstetrics

## 2023-11-18 VITALS — BP 118/75 | HR 82 | Wt 229.9 lb

## 2023-11-18 DIAGNOSIS — Z3A19 19 weeks gestation of pregnancy: Secondary | ICD-10-CM

## 2023-11-18 DIAGNOSIS — O99212 Obesity complicating pregnancy, second trimester: Secondary | ICD-10-CM

## 2023-11-18 DIAGNOSIS — Z1331 Encounter for screening for depression: Secondary | ICD-10-CM

## 2023-11-18 DIAGNOSIS — O99322 Drug use complicating pregnancy, second trimester: Secondary | ICD-10-CM

## 2023-11-18 DIAGNOSIS — O0932 Supervision of pregnancy with insufficient antenatal care, second trimester: Secondary | ICD-10-CM | POA: Diagnosis not present

## 2023-11-18 DIAGNOSIS — O99210 Obesity complicating pregnancy, unspecified trimester: Secondary | ICD-10-CM

## 2023-11-18 DIAGNOSIS — Z6791 Unspecified blood type, Rh negative: Secondary | ICD-10-CM

## 2023-11-18 DIAGNOSIS — F1910 Other psychoactive substance abuse, uncomplicated: Secondary | ICD-10-CM

## 2023-11-18 DIAGNOSIS — O26892 Other specified pregnancy related conditions, second trimester: Secondary | ICD-10-CM | POA: Diagnosis not present

## 2023-11-18 DIAGNOSIS — O0990 Supervision of high risk pregnancy, unspecified, unspecified trimester: Secondary | ICD-10-CM

## 2023-11-18 DIAGNOSIS — O0930 Supervision of pregnancy with insufficient antenatal care, unspecified trimester: Secondary | ICD-10-CM

## 2023-11-18 NOTE — Progress Notes (Unsigned)
 ZSubjective:    Cassandra Harrison is being seen today for her first obstetrical visit.  This is not a planned pregnancy. She is at 19 weeks 2 days. Her obstetrical history is significant for obesity. Relationship with FOB: spouse, not living together. Patient does intend to breast feed. Pregnancy history fully reviewed.  The information documented in the HPI was reviewed and verified.  Menstrual History: OB History     Gravida  3   Para  2   Term  2   Preterm  0   AB  0   Living  2      SAB  0   IAB  0   Ectopic  0   Multiple  0   Live Births  2            No LMP recorded (lmp unknown). Patient is pregnant.    Past Medical History:  Diagnosis Date   Asthma    last used 11/29/11   Bulging disc    Chronic back pain    Complication of anesthesia    itching with epidural   Constipation    COVID-19 virus infection 05/2020   Mental disorder    Migraine    Morbid obesity (HCC)     Past Surgical History:  Procedure Laterality Date   WISDOM TOOTH EXTRACTION      (Not in a hospital admission)  Allergies  Allergen Reactions   Olive Oil Nausea And Vomiting and Other (See Comments)    She can not have anything with olives in it   Latex Rash and Other (See Comments)    Patient states that she is allergic to latex condoms.  They give her a rash and a yeast infection.    Social History   Tobacco Use   Smoking status: Every Day    Current packs/day: 0.25    Average packs/day: 0.3 packs/day for 0.2 years    Types: Cigarettes    Start date: 09/2023   Smokeless tobacco: Never  Substance Use Topics   Alcohol use: Not Currently    Comment: occ    Family History  Problem Relation Age of Onset   Diabetes Mother    COPD Mother    Heart disease Mother    Cancer Mother        throat cancer   COPD Father    Heart disease Father    Cancer Sister    Cancer Paternal Aunt    Diabetes Maternal Grandmother    Cancer Maternal Grandmother    COPD Paternal  Grandmother    Heart disease Paternal Grandfather    Other Neg Hx      Review of Systems Constitutional: negative for weight loss Gastrointestinal: negative for vomiting Genitourinary:negative for genital lesions and vaginal discharge and dysuria Musculoskeletal:negative for back pain Behavioral/Psych: negative for abusive relationship, depression, illegal drug usage and tobacco use    Objective:    BP 118/75   Pulse 82   Wt 229 lb 14.4 oz (104.3 kg)   LMP  (LMP Unknown)   BMI 42.05 kg/m  General Appearance:    Alert, cooperative, no distress, appears stated age  Head:    Normocephalic, without obvious abnormality, atraumatic  Eyes:    PERRL, conjunctiva/corneas clear, EOM's intact, fundi    benign, both eyes  Ears:    Normal TM's and external ear canals, both ears  Nose:   Nares normal, septum midline, mucosa normal, no drainage    or sinus tenderness  Throat:   Lips, mucosa, and tongue normal; teeth and gums normal  Neck:   Supple, symmetrical, trachea midline, no adenopathy;    thyroid:  no enlargement/tenderness/nodules; no carotid   bruit or JVD  Back:     Symmetric, no curvature, ROM normal, no CVA tenderness  Lungs:     Clear to auscultation bilaterally, respirations unlabored  Chest Wall:    No tenderness or deformity   Heart:    Regular rate and rhythm, S1 and S2 normal, no murmur, rub   or gallop  Breast Exam:    No tenderness, masses, or nipple abnormality  Abdomen:     Soft, non-tender, bowel sounds active all four quadrants,    no masses, no organomegaly  Genitalia:    Normal female without lesion, discharge or tenderness  Extremities:   Extremities normal, atraumatic, no cyanosis or edema  Pulses:   2+ and symmetric all extremities  Skin:   Skin color, texture, turgor normal, no rashes or lesions  Lymph nodes:   Cervical, supraclavicular, and axillary nodes normal  Neurologic:   CNII-XII intact, normal strength, sensation and reflexes    throughout      Lab  Review Urine pregnancy test Labs reviewed yes Radiologic studies reviewed yes ULTRASOUND:   US  OB Limited (Accession [id number]) (Order [id number]) Imaging Date: 11/06/2023 Department: [facility] Released By: Donette Furlong, RN (auto-released) Authorizing: Abigail Abler, MD   Exam Status  Status  Final [99]   PACS Intelerad Image Link   Show images for US  OB Limited Study Result  Narrative & Impression    ----------------------------------------------------------------------  OBSTETRICS REPORT                       (Signed Final 11/12/2023 03:40 pm) ---------------------------------------------------------------------- Patient Info    ID #:       [id number]                          D.O.B.:  [birth year] (33 yrs)(F)  Name:       Cassandra Harrison               Visit Date: 11/06/2023 04:19 pm ---------------------------------------------------------------------- Performed By    Attending:        Avie Boeck MD       Ref. Address:     [address]                                                             [place], [place]  [address]  Performed By:     Hortensia Ma RN        Location:         [facility]                                                             [facility]  Referred By:      [facility] ---------------------------------------------------------------------- Orders    #  Description                           Code        Ordered By  1  US  OB LIMITED                         60454.0     Avie Boeck ----------------------------------------------------------------------    #  Order #                     Accession #                Episode #  1  [id number]                    [id number]                 [id number] ---------------------------------------------------------------------- Indications    Weeks of gestation of pregnancy not            Z3A.00  specified ---------------------------------------------------------------------- Fetal Evaluation    Num Of Fetuses:         1  Preg. Location:         Intrauterine  Gest. Sac:              Intrauterine  Yolk Sac:               Not visualized  Fetal Heart Rate(bpm):  138  Cardiac Activity:       Observed    Comment:    Second trimester fetus, needs formal dating/anatomy scan ---------------------------------------------------------------------- OB History    Gravidity:    3         Term:   2        Prem:   0        SAB:   0  TOP:          0       Ectopic:  0        Living: 2 ---------------------------------------------------------------------- Comments    OB limited US  for FHR and approximate GA  to guide care.  Single live IUP. Fetal cardiac activity and fetal movement  observed.  Informal FL 24.80mm=[redacted]w[redacted]d. Unknown LMP,  possible January. Late to care. Formal dating and biometry  deferred to MFM. ----------------------------------------------------------------------                Avie Boeck, MD Electronically Signed Final Report   11/12/2023 03:40 pm ----------------------------------------------------------------------      Result History  US  OB Limited (Order #[id number]) on 11/12/2023 - Order Result History Report  MyChart Results Release  MyChart Status: Active  Results Release   Encounter-Level Documents:  There are no encounter-level documents.   Order-Level Documents:  There are no order-level documents. Vitals  Height Weight BMI (Calculated)   229 lb 14.4 oz (104.3 kg)    Imaging  Imaging Information    US  OB Limited: Patient Communication   Add Comments   Seen   Resulted by:  Signed Date/Time  Phone Pager  Abigail Abler 11/12/2023  3:40 PM [phone]     Reference Links       Original Order  Ordered On Ordered By   11/06/2023  2:04 PM Donette Furlong, RN        Order History    Parent Order ID Child Order ID  [id number]    External Result Report  External Result Report   Existing Charges  Charge Line Charge Code Status Charge Trigger Charge Type  [id number] US  Pregnant Uterus Limited 1/> Fetuses 44010 Filed - [employer] [facility] result study Global   Assessment:    Pregnancy at 19 weeks and 2 days by informal ultrasound ( Unknown LMP )   Plan:    1. Supervision of high risk pregnancy, antepartum (Primary) Rx: - US  MFM OB DETAIL +14 WK; Future  2. Late prenatal care affecting pregnancy, antepartum  3. Obesity affecting pregnancy, antepartum, unspecified obesity type  4. Substance abuse affecting pregnancy, antepartum (HCC)  5. Rh negative state in antepartum period - Rhogam at 28 weeks and postpartum    Prenatal vitamins.  Counseling provided regarding continued use of seat belts, cessation of alcohol consumption, smoking or use of illicit drugs; infection precautions i.e., influenza/TDAP immunizations, toxoplasmosis,CMV, parvovirus, listeria and varicella; workplace safety, exercise during pregnancy; routine dental care, safe medications, sexual activity, hot tubs, saunas, pools, travel, caffeine use, fish and methlymercury, potential toxins, hair treatments, varicose veins Weight gain recommendations per IOM guidelines reviewed: underweight/BMI< 18.5--> gain 28 - 40 lbs; normal weight/BMI 18.5 - 24.9--> gain 25 - 35 lbs; overweight/BMI 25 - 29.9--> gain 15 - 25 lbs; obese/BMI >30->gain  11 - 20 lbs Problem list reviewed and updated. FIRST/CF mutation testing/NIPT/QUAD SCREEN/fragile X/Ashkenazi Jewish population testing/Spinal muscular atrophy discussed: requested. Role of ultrasound in pregnancy discussed; fetal survey: requested. Amniocentesis discussed: not indicated.  No orders  of the defined types were placed in this encounter.  Orders Placed This Encounter  Procedures   US  MFM OB DETAIL +14 WK    Standing Status:   Future    Expiration Date:   11/17/2024    Reason for Exam (SYMPTOM  OR DIAGNOSIS REQUIRED):   Anatomy    Preferred Location:   [facility]    Follow up in 4 weeks.  I have spent a total of 20 minutes of face-to-face and non-face-to-face time, excluding clinical staff time, reviewing notes and preparing to see patient, ordering tests and/or medications, and counseling the patient.   Gabrielle Joiner, MD, FACOG Attending Obstetrician & Gynecologist, [facility], [employer], [facility] 11/18/2023

## 2023-11-18 NOTE — Progress Notes (Unsigned)
 Pt. Presents for new ob. Pt has no questions or concerns at this time.

## 2023-11-25 ENCOUNTER — Inpatient Hospital Stay (HOSPITAL_COMMUNITY): Payer: MEDICAID

## 2023-11-25 ENCOUNTER — Inpatient Hospital Stay (HOSPITAL_COMMUNITY)
Admission: AD | Admit: 2023-11-25 | Discharge: 2023-11-26 | Disposition: A | Discharge status: Home / Self Care | Payer: MEDICAID | Attending: Obstetrics and Gynecology | Admitting: Obstetrics and Gynecology

## 2023-11-25 ENCOUNTER — Encounter (HOSPITAL_COMMUNITY): Payer: Self-pay | Admitting: Obstetrics and Gynecology

## 2023-11-25 ENCOUNTER — Other Ambulatory Visit: Payer: Self-pay

## 2023-11-25 DIAGNOSIS — O99512 Diseases of the respiratory system complicating pregnancy, second trimester: Secondary | ICD-10-CM | POA: Diagnosis not present

## 2023-11-25 DIAGNOSIS — J069 Acute upper respiratory infection, unspecified: Secondary | ICD-10-CM | POA: Insufficient documentation

## 2023-11-25 DIAGNOSIS — Z1152 Encounter for screening for COVID-19: Secondary | ICD-10-CM | POA: Diagnosis not present

## 2023-11-25 DIAGNOSIS — Z3A20 20 weeks gestation of pregnancy: Secondary | ICD-10-CM | POA: Insufficient documentation

## 2023-11-25 DIAGNOSIS — O26892 Other specified pregnancy related conditions, second trimester: Secondary | ICD-10-CM | POA: Diagnosis present

## 2023-11-25 DIAGNOSIS — R051 Acute cough: Secondary | ICD-10-CM | POA: Insufficient documentation

## 2023-11-25 DIAGNOSIS — J4520 Mild intermittent asthma, uncomplicated: Secondary | ICD-10-CM | POA: Insufficient documentation

## 2023-11-25 DIAGNOSIS — O98512 Other viral diseases complicating pregnancy, second trimester: Secondary | ICD-10-CM | POA: Diagnosis not present

## 2023-11-25 DIAGNOSIS — B9789 Other viral agents as the cause of diseases classified elsewhere: Secondary | ICD-10-CM | POA: Insufficient documentation

## 2023-11-25 MED ORDER — IPRATROPIUM-ALBUTEROL 0.5-2.5 (3) MG/3ML IN SOLN
3.0000 mL | Freq: Once | RESPIRATORY_TRACT | Status: AC
  Administered 2023-11-26: 3 mL via RESPIRATORY_TRACT
  Filled 2023-11-25: qty 3

## 2023-11-25 MED ORDER — ACETAMINOPHEN 500 MG PO TABS
1000.0000 mg | ORAL_TABLET | Freq: Once | ORAL | Status: AC
  Administered 2023-11-25: 1000 mg via ORAL
  Filled 2023-11-25: qty 2

## 2023-11-25 NOTE — MAU Provider Note (Signed)
 Chief Complaint:  Sore Throat, Cough (c), and Nasal Congestion   HPI   None     Cassandra Harrison is a 33 y.o. G3P2002 at [redacted]w[redacted]d who presents to maternity admissions reporting sore throat and congestion.  On 6/14, she started experiencing sore throat and congestion.  Symptoms worsen on 6/15, today states that she can't breathe good.  She reports coughing up mucus with blood last night.  She endorses rhinorrhea, sometimes clear and sometimes yellow.  Endorses body aches 8/10 intensity.  Medical history significant for asthma and smoking.  Pregnancy Course: Receives care at [facility]. Prenatal records reviewed.   Past Medical History:  Diagnosis Date   Asthma    last used 11/29/11   Bulging disc    Chronic back pain    Complication of anesthesia    itching with epidural   Constipation    COVID-19 virus infection 05/2020   Mental disorder    Migraine    Morbid obesity (HCC)    OB History  Gravida Para Term Preterm AB Living  3 2 2  0 0 2  SAB IAB Ectopic Multiple Live Births  0 0 0 0 2    # Outcome Date GA Lbr Len/2nd Weight Sex Type Anes PTL Lv  3 Current           2 Term 06/23/20 [redacted]w[redacted]d 29:03 / 00:23 3195 g F Vag-Spont EPI  LIV  1 Term 12/08/11 [redacted]w[redacted]d / 01:52 3495 g F Vag-Spont EPI  LIV   Past Surgical History:  Procedure Laterality Date   WISDOM TOOTH EXTRACTION     Family History  Problem Relation Age of Onset   Diabetes Mother    COPD Mother    Heart disease Mother    Cancer Mother        throat cancer   COPD Father    Heart disease Father    Cancer Sister    Cancer Paternal Aunt    Diabetes Maternal Grandmother    Cancer Maternal Grandmother    COPD Paternal Grandmother    Heart disease Paternal Grandfather    Other Neg Hx    Social History   Tobacco Use   Smoking status: Every Day    Current packs/day: 0.25    Average packs/day: 0.3 packs/day for 0.2 years (0.1 ttl pk-yrs)    Types: Cigarettes    Start date: 09/2023   Smokeless tobacco: Never   Vaping Use   Vaping status: Never Used  Substance Use Topics   Alcohol use: Not Currently    Comment: occ   Drug use: Not Currently    Types: Marijuana   Allergies  Allergen Reactions   Olive Oil Nausea And Vomiting and Other (See Comments)    She can not have anything with olives in it   Latex Rash and Other (See Comments)    Patient states that she is allergic to latex condoms.  They give her a rash and a yeast infection.   Medications Prior to Admission  Medication Sig Dispense Refill Last Dose/Taking   Prenatal Vit-Fe Phos-FA-Omega (VITAFOL  GUMMIES) 3.33-0.333-34.8 MG CHEW Chew 3 tablets by mouth daily. 90 tablet 11 11/25/2023   Blood Pressure Monitoring (BLOOD PRESSURE KIT) DEVI 1 Device by Does not apply route once a week. 1 each 0    FLUoxetine (PROZAC) 20 MG capsule Take 20 mg by mouth daily. (Patient not taking: Reported on 11/18/2023)      hydrocortisone  cream 1 % Apply topically 2 (two) times daily. (Patient  not taking: Reported on 11/18/2023)      hydrOXYzine  (ATARAX ) 10 MG tablet Take 10 mg by mouth 3 (three) times daily as needed. (Patient not taking: Reported on 11/18/2023)      OLANZapine  zydis (ZYPREXA ) 5 MG disintegrating tablet Take 1 tablet (5 mg total) by mouth at bedtime.       I have reviewed patient's Past Medical Hx, Surgical Hx, Family Hx, Social Hx, medications and allergies.   ROS  Pertinent items noted in HPI and remainder of comprehensive ROS otherwise negative.   PHYSICAL EXAM  Patient Vitals for the past 24 hrs:  BP Temp Temp src Pulse Resp SpO2 Height Weight  11/25/23 2246 112/72 98.6 F (37 C) Oral 90 19 100 % 5' 2 (1.575 m) 105.3 kg    Constitutional: Well-developed, well-nourished female in no acute distress.  HEENT: atraumatic, normocephalic. Neck has normal ROM. EOM intact. TM without effusion or erythema bilaterally. Cardiovascular: normal rate & rhythm, warm and well-perfused Respiratory: normal effort, no problems with respiration noted.  CTA bilaterally, no wheezing/rhonchi/rales. GI: Abd soft, non-tender, non-distended MSK: Extremities nontender, no edema, normal ROM Skin: warm and dry. Acyanotic, no jaundice or pallor. Neurologic: Alert and oriented x 4. No abnormal coordination. Psychiatric: Normal mood. Speech not slurred, not rapid/pressured. Patient is cooperative. GU: no CVA tenderness  Labs: Results for orders placed or performed during the hospital encounter of 11/25/23 (from the past 24 hours)  Resp panel by RT-PCR (RSV, Flu A&B, Covid) Urine, Clean Catch     Status: None   Collection Time: 11/25/23 11:10 PM   Specimen: Urine, Clean Catch; Nasal Swab  Result Value Ref Range   SARS Coronavirus 2 by RT PCR NEGATIVE NEGATIVE   Influenza A by PCR NEGATIVE NEGATIVE   Influenza B by PCR NEGATIVE NEGATIVE   Resp Syncytial Virus by PCR NEGATIVE NEGATIVE  Urinalysis, Routine w reflex microscopic -Urine, Clean Catch     Status: Abnormal   Collection Time: 11/25/23 11:43 PM  Result Value Ref Range   Color, Urine YELLOW YELLOW   APPearance CLOUDY (A) CLEAR   Specific Gravity, Urine 1.026 1.005 - 1.030   pH 5.0 5.0 - 8.0   Glucose, UA NEGATIVE NEGATIVE mg/dL   Hgb urine dipstick NEGATIVE NEGATIVE   Bilirubin Urine NEGATIVE NEGATIVE   Ketones, ur NEGATIVE NEGATIVE mg/dL   Protein, ur NEGATIVE NEGATIVE mg/dL   Nitrite NEGATIVE NEGATIVE   Leukocytes,Ua LARGE (A) NEGATIVE   RBC / HPF 0-5 0 - 5 RBC/hpf   WBC, UA 21-50 0 - 5 WBC/hpf   Bacteria, UA FEW (A) NONE SEEN   Squamous Epithelial / HPF 21-50 0 - 5 /HPF   Mucus PRESENT     Imaging:  DG Chest 2 View Result Date: 11/25/2023 EXAM: 2 VIEW(S) XRAY OF THE CHEST 11/25/2023 11:49:32 PM COMPARISON: 06/24/2023 CLINICAL HISTORY: Hemoptysis. [redacted]w[redacted]d pregnant. Saturday started having sore throat and congested. Yesterday and today got worse - can't breath good. States she hasn't felt baby move today. Reports coughing up mucous with blood in it last night. FINDINGS: LUNGS AND  PLEURA: No focal pulmonary opacity. No pulmonary edema. No pleural effusion. No pneumothorax. HEART AND MEDIASTINUM: No acute abnormality of the cardiac and mediastinal silhouettes. BONES AND SOFT TISSUES: No acute osseous abnormality. IMPRESSION: 1. No acute process. Electronically signed by: Zadie Herter MD 11/25/2023 11:55 PM EDT RP Workstation: [id number]    MDM & MAU COURSE  MDM: Moderate  MAU Course: -Vital signs within normal limits.  No fever or  hypoxia. -DuoNeb treatment given history of asthma. -Respiratory panel for influenza, RSV, COVID.  Chest x-ray given complaint of hemoptysis. -Tylenol  for body aches. -CXR negative for acute processes including pneumonia. -Negative influenza, RSV, COVID.  Differential diagnosis considered for cough includes but is not limited to: viral respiratory tract infection, bacterial pneumonia, asthma   Orders Placed This Encounter  Procedures   Resp panel by RT-PCR (RSV, Flu A&B, Covid) Anterior Nasal Swab   DG Chest 2 View   Urinalysis, Routine w reflex microscopic -Urine, Clean Catch   Airborne and Contact precautions   Discharge patient   Meds ordered this encounter  Medications   ipratropium-albuterol  (DUONEB) 0.5-2.5 (3) MG/3ML nebulizer solution 3 mL   acetaminophen  (TYLENOL ) tablet 1,000 mg   Dextromethorphan-guaiFENesin  10-200 MG CAPS    Sig: Take 1-2 capsules by mouth every 4 (four) hours as needed (cough and congestion).    Dispense:  168 capsule    Refill:  0   cetirizine (ZYRTEC ALLERGY) 10 MG tablet    Sig: Take 1 tablet (10 mg total) by mouth daily.    Dispense:  30 tablet    Refill:  0   fluticasone  (FLONASE ) 50 MCG/ACT nasal spray    Sig: Place 1 spray into both nostrils daily.    Dispense:  15.8 mL    Refill:  1   albuterol  (VENTOLIN  HFA) 108 (90 Base) MCG/ACT inhaler    Sig: Inhale 1-2 puffs into the lungs every 6 (six) hours as needed for wheezing or shortness of breath.    Dispense:  1 each    Refill:  1     ASSESSMENT   1. Viral URI   2. Acute cough   3. Mild intermittent asthma without complication   4. [redacted] weeks gestation of pregnancy     PLAN  Discharge home in stable condition with preterm labor and asthma precautions.  Prescriptions sent for symptomatic relief with dextromethorphan-guaifenesin , cetirizine, nasal spray, albuterol  inhaler.  Patient encouraged to increase fluid intake.   Follow-up Information     [facility] Follow up.   Specialty: Obstetrics and Gynecology Why: As scheduled for ongoing prenatal care Contact information: [address] [place]  [address] [phone]                 Allergies as of 11/26/2023       Reactions   Olive Oil Nausea And Vomiting, Other (See Comments)   She can not have anything with olives in it   Latex Rash, Other (See Comments)   Patient states that she is allergic to latex condoms.  They give her a rash and a yeast infection.        Medication List     TAKE these medications    albuterol  108 (90 Base) MCG/ACT inhaler Commonly known as: VENTOLIN  HFA Inhale 1-2 puffs into the lungs every 6 (six) hours as needed for wheezing or shortness of breath.   Blood Pressure Kit Devi 1 Device by Does not apply route once a week.   cetirizine 10 MG tablet Commonly known as: ZyrTEC Allergy Take 1 tablet (10 mg total) by mouth daily.   Dextromethorphan-guaiFENesin  10-200 MG Caps Take 1-2 capsules by mouth every 4 (four) hours as needed (cough and congestion).   FLUoxetine 20 MG capsule Commonly known as: PROZAC Take 20 mg by mouth daily.   fluticasone  50 MCG/ACT nasal spray Commonly known as: FLONASE  Place 1 spray into both nostrils  daily.   hydrocortisone  cream 1 % Apply topically 2 (two) times daily.   hydrOXYzine  10 MG tablet Commonly known as: ATARAX  Take 10 mg by mouth 3 (three) times daily as needed.   OLANZapine  zydis 5 MG  disintegrating tablet Commonly known as: ZYPREXA  Take 1 tablet (5 mg total) by mouth at bedtime.   Vitafol  Gummies 3.33-0.333-34.8 MG Chew Chew 3 tablets by mouth daily.        Noreene Bearded, PA

## 2023-11-25 NOTE — MAU Note (Addendum)
 Cassandra Harrison is a 33 y.o. at [redacted]w[redacted]d here in MAU reporting: Saturday started having sore throat and congested. Yesterday and today got worse - can't breath good. States she hasn't felt baby move today. Reports coughing up mucous with blood in it last night. When I blow my nose, sometimes it's clear and sometimes it's yellow. Also having body aches. Took robitussin at 1500  LMP: NA Onset of complaint: saturday Pain score: 8 - throat; 8 - body Vitals:   11/25/23 2246  BP: 112/72  Pulse: 90  Resp: 19  Temp: 98.6 F (37 C)  SpO2: 100%     FHT: 160  Lab orders placed from triage: UA

## 2023-11-25 NOTE — MAU Note (Signed)
 RT at bedside.

## 2023-11-26 DIAGNOSIS — J069 Acute upper respiratory infection, unspecified: Secondary | ICD-10-CM

## 2023-11-26 LAB — URINALYSIS, ROUTINE W REFLEX MICROSCOPIC
Bilirubin Urine: NEGATIVE
Glucose, UA: NEGATIVE mg/dL
Hgb urine dipstick: NEGATIVE
Ketones, ur: NEGATIVE mg/dL
Nitrite: NEGATIVE
Protein, ur: NEGATIVE mg/dL
Specific Gravity, Urine: 1.026 (ref 1.005–1.030)
pH: 5 (ref 5.0–8.0)

## 2023-11-26 LAB — RESP PANEL BY RT-PCR (RSV, FLU A&B, COVID)  RVPGX2
Influenza A by PCR: NEGATIVE
Influenza B by PCR: NEGATIVE
Resp Syncytial Virus by PCR: NEGATIVE
SARS Coronavirus 2 by RT PCR: NEGATIVE

## 2023-11-26 MED ORDER — ALBUTEROL SULFATE HFA 108 (90 BASE) MCG/ACT IN AERS: 1.0000 | INHALATION_SPRAY | Freq: Four times a day (QID) | RESPIRATORY_TRACT | 1 refills | Status: DC | PRN

## 2023-11-26 MED ORDER — CETIRIZINE HCL 10 MG PO TABS: 10.0000 mg | ORAL_TABLET | Freq: Every day | ORAL | 0 refills | Status: DC

## 2023-11-26 MED ORDER — FLUTICASONE PROPIONATE 50 MCG/ACT NA SUSP: 1.0000 | Freq: Every day | NASAL | 1 refills | Status: DC

## 2023-11-26 MED ORDER — DEXTROMETHORPHAN-GUAIFENESIN 10-200 MG PO CAPS: 1.0000 | ORAL_CAPSULE | ORAL | 0 refills | Status: DC | PRN

## 2023-11-26 NOTE — Discharge Instructions (Addendum)
 I have sent several medicines to your pharmacy: a medicine to decrease cough and thin out mucus to make it easier for your body to clear, Flonase  nasal spray to decrease nasal congestion which should also help with ear pain, cetirizine to decrease swelling to help with congestion and ear pain, and an albuterol  inhaler.  You may also use the following over the counter options for any other symptoms.  Pain/Headache:  Tylenol : 2 regular strength every 4 hours OR               2 Extra strength every 6 hours   Cough:  Breath right strips  Cepacol throat lozenges  Chloraseptic throat spray  Cough drops, alcohol free  Saline nasal spray/drops  Sudafed (pseudoephedrine) & Actifed * use only after [redacted] weeks gestation and if you do not have high blood pressure  Tylenol   Vicks Vaporub  Zinc lozenges  **If taking multiple medications, please check labels to avoid duplicating the same active ingredients  **take medication as directed on the label  ** Do not exceed 4000 mg of tylenol  in 24 hours  **Do not take medications that contain aspirin  or ibuprofen 

## 2023-12-17 ENCOUNTER — Encounter: Payer: Self-pay | Admitting: Advanced Practice Midwife

## 2023-12-17 ENCOUNTER — Ambulatory Visit (INDEPENDENT_AMBULATORY_CARE_PROVIDER_SITE_OTHER): Admitting: Advanced Practice Midwife

## 2023-12-17 VITALS — BP 114/72 | HR 84 | Wt 241.0 lb

## 2023-12-17 DIAGNOSIS — O0990 Supervision of high risk pregnancy, unspecified, unspecified trimester: Secondary | ICD-10-CM | POA: Diagnosis not present

## 2023-12-17 DIAGNOSIS — Z3A23 23 weeks gestation of pregnancy: Secondary | ICD-10-CM | POA: Diagnosis not present

## 2023-12-17 DIAGNOSIS — O0930 Supervision of pregnancy with insufficient antenatal care, unspecified trimester: Secondary | ICD-10-CM | POA: Diagnosis not present

## 2023-12-17 DIAGNOSIS — O26892 Other specified pregnancy related conditions, second trimester: Secondary | ICD-10-CM | POA: Diagnosis not present

## 2023-12-17 DIAGNOSIS — R102 Pelvic and perineal pain: Secondary | ICD-10-CM

## 2023-12-17 NOTE — Progress Notes (Signed)
   PRENATAL VISIT NOTE  Subjective:  Cassandra Harrison is a 33 y.o. G3P2002 at [redacted]w[redacted]d being seen today for ongoing prenatal care.  She is currently monitored for the following issues for this high-risk pregnancy and has Asthma; Morbid obesity (HCC); Smoker within last 12 months; Female stress incontinence; Sprain of anterior talofibular ligament of left ankle; and Supervision of high risk pregnancy, antepartum on their problem list.  Patient reports pelvic pain.  Contractions: Not present. Vag. Bleeding: None.  Movement: Present. Denies leaking of fluid.   The following portions of the patient's history were reviewed and updated as appropriate: allergies, current medications, past family history, past medical history, past social history, past surgical history and problem list.   Objective:    Vitals:   12/17/23 1106  BP: 114/72  Pulse: 84  Weight: 241 lb (109.3 kg)    Fetal Status:  Fetal Heart Rate (bpm): 148 Fundal Height: 23 cm Movement: Present    General: Alert, oriented and cooperative. Patient is in no acute distress.  Skin: Skin is warm and dry. No rash noted.   Cardiovascular: Normal heart rate noted  Respiratory: Normal respiratory effort, no problems with respiration noted  Abdomen: Soft, gravid, appropriate for gestational age.  Pain/Pressure: Present     Pelvic: Cervical exam deferred        Extremities: Normal range of motion.  Edema: None  Mental Status: Normal mood and affect. Normal behavior. Normal judgment and thought content.   Assessment and Plan:  Pregnancy: G3P2002 at [redacted]w[redacted]d 1. Supervision of high risk pregnancy, antepartum (Primary) --Anticipatory guidance about next visits/weeks of pregnancy given.  --GTT at next visit, reviewed fasting, expectations for test --Anatomy US  scheduled 7/18  2. Late prenatal care affecting pregnancy, antepartum   3. [redacted] weeks gestation of pregnancy   4. Pelvic pain affecting pregnancy in second trimester,  antepartum --Rest/ice/heat/warm bath/increase PO fluids/Tylenol /pregnancy support belt  --Offered PT, pt declined for now --Pain worse after sitting for long periods, pt trying to get up to walk more which is helping.  Preterm labor symptoms and general obstetric precautions including but not limited to vaginal bleeding, contractions, leaking of fluid and fetal movement were reviewed in detail with the patient. Please refer to After Visit Summary for other counseling recommendations.   No follow-ups on file.  Future Appointments  Date Time Provider Department Center  12/27/2023 10:00 AM [facility] PROVIDER 1 [facility]  12/27/2023 10:30 AM [facility] US1 [facility]  01/14/2024  8:30 AM [facility] None  01/14/2024 10:15 AM Zina Jerilynn LABOR, MD [facility] None    Olam Boards, CNM

## 2023-12-17 NOTE — Progress Notes (Signed)
Pt presents for ROB visit. Pt c/o pelvic pain.

## 2023-12-19 ENCOUNTER — Telehealth: Payer: Self-pay | Admitting: Obstetrics and Gynecology

## 2023-12-19 NOTE — Telephone Encounter (Signed)
 Called the pt to verify her insurance and she stated that she will have to go to Social Service and see what is going on with her insurance.  Pt stated that she will make sure that she brings in the new insurance at her next appointment on 01/14/2024. Pt is aware that if she is unable to provided insurance she will be considered Self Pay.

## 2023-12-27 ENCOUNTER — Other Ambulatory Visit: Payer: Self-pay | Admitting: *Deleted

## 2023-12-27 ENCOUNTER — Ambulatory Visit (HOSPITAL_BASED_OUTPATIENT_CLINIC_OR_DEPARTMENT_OTHER): Payer: MEDICAID | Admitting: Maternal & Fetal Medicine

## 2023-12-27 ENCOUNTER — Ambulatory Visit: Payer: MEDICAID | Attending: Obstetrics

## 2023-12-27 VITALS — BP 133/65 | HR 87

## 2023-12-27 DIAGNOSIS — O99512 Diseases of the respiratory system complicating pregnancy, second trimester: Secondary | ICD-10-CM

## 2023-12-27 DIAGNOSIS — O99212 Obesity complicating pregnancy, second trimester: Secondary | ICD-10-CM | POA: Diagnosis not present

## 2023-12-27 DIAGNOSIS — J45909 Unspecified asthma, uncomplicated: Secondary | ICD-10-CM

## 2023-12-27 DIAGNOSIS — Z3A24 24 weeks gestation of pregnancy: Secondary | ICD-10-CM | POA: Diagnosis present

## 2023-12-27 DIAGNOSIS — O0990 Supervision of high risk pregnancy, unspecified, unspecified trimester: Secondary | ICD-10-CM | POA: Diagnosis present

## 2023-12-27 DIAGNOSIS — O0932 Supervision of pregnancy with insufficient antenatal care, second trimester: Secondary | ICD-10-CM | POA: Diagnosis not present

## 2023-12-27 DIAGNOSIS — Z3A25 25 weeks gestation of pregnancy: Secondary | ICD-10-CM

## 2023-12-27 NOTE — Progress Notes (Signed)
 Patient information  Patient Name: Cassandra Harrison  Patient MRN:   [id number]  Referring practice: [facility] Referring Provider: [place] [facility]  Problem List   Patient Active Problem List   Diagnosis Date Noted   Supervision of high risk pregnancy, antepartum 11/06/2023   Sprain of anterior talofibular ligament of left ankle 12/14/2021   Smoker within last 12 months 08/02/2020   Female stress incontinence 08/02/2020   Asthma    Morbid obesity (HCC)    Maternal Fetal Medicine Consult Cassandra Harrison is a 33 y.o. G3P2002 at [redacted]w[redacted]d here for ultrasound and consultation. She had Low risk aneuploidy screening of a female fetus. Carrier screening was Negative for the basic screening (SMA, alpha-thal, beta-thal, and cystic fibroisis. Maternal serum AFP was negative. She has no acute concerns.   Today we focused on the following:   Elevated BMI I discussed the potential complications associated with obesity in pregnancy.  These complications include but are not limited to increased risk of excessive maternal weight gain, fetal growth abnormalities, fetal congenital disorders, inability to visualize fetal anatomic structures on ultrasound, gestational diabetes, hypertensive disorders of pregnancy, operative birth including cesarean delivery or assisted vaginal delivery, delayed wound healing and many long-term health complications.  I discussed the need for continued growth ultrasounds and possibly antenatal testing depending upon how the pregnancy course progresses.  Maternal weight gain should be limited to 10 to 20 pounds during the pregnancy.  While normal weight loss may occur during the first and early second trimester, efforts to actively lose weight with the use of medication is not recommended during pregnancy.  A whole food diet and regular exercise of at least 15 to 30 minutes of moderately strenuous activity is recommended in the absence of any contraindications. Weight loss with the use  of medications is not recommended during pregnancy.  If other existing comorbidities are present then 81 mg of aspirin  should be considered for preeclampsia risk reduction.   Sonographic findings Single intrauterine pregnancy at 25w 2d  Fetal cardiac activity:  Observed and appears normal. Presentation: Cephalic. The anatomic structures that were well seen appear normal without evidence of soft markers. Due to poor acoustic windows some structures remain suboptimally visualized. Fetal biometry shows the estimated fetal weight at the 39 percentile.  Amniotic fluid: Within normal limits.  MVP: 6.69 cm. Placenta: Posterior. Adnexa: No abnormality visualized. Cervical length: 3.1 cm.  Recommendations - Anatomy ultrasound was done today with the above findings (see report). - Aspirin  81-162 mg from 12 weeks and continued throughout the pregnancy for preeclampsia prophylaxis. - Baseline labs: CMP, CBC, urine protein creatinine ratio. - Blood pressure goal of < 140 systolic and < 90 diastolic. Antihypertensive medication should be added/adjusted until BP goal is achieved. - Serial growth ultrasounds every 4 weeks starting at 24 to 28 weeks until delivery. - Antenatal testing (usually weekly BPP or NST) weekly at 32 weeks until delivery. - Delivery likely around [redacted] weeks gestation or sooner if indicated.  Review of Systems: A review of systems was performed and was negative except per HPI   Past Obstetrical History:  OB History  Gravida Para Term Preterm AB Living  3 2 2  0 0 2  SAB IAB Ectopic Multiple Live Births  0 0 0 0 2    # Outcome Date GA Lbr Len/2nd Weight Sex Type Anes PTL Lv  3 Current           2 Term 06/23/20 [redacted]w[redacted]d 29:03 / 00:23 7 lb  0.7 oz (3.195 kg) F Vag-Spont EPI  LIV  1 Term 12/08/11 [redacted]w[redacted]d / 01:52 7 lb 11.3 oz (3.495 kg) F Vag-Spont EPI  LIV     Past Medical History:  Past Medical History:  Diagnosis Date   Asthma    last used 11/29/11   Bulging disc    Chronic back  pain    Complication of anesthesia    itching with epidural   Constipation    COVID-19 virus infection 05/2020   Mental disorder    Migraine    Morbid obesity (HCC)      Past Surgical History:    Past Surgical History:  Procedure Laterality Date   WISDOM TOOTH EXTRACTION       Home Medications:   Current Outpatient Medications on File Prior to Visit  Medication Sig Dispense Refill   albuterol  (VENTOLIN  HFA) 108 (90 Base) MCG/ACT inhaler Inhale 1-2 puffs into the lungs every 6 (six) hours as needed for wheezing or shortness of breath. 1 each 1   Prenatal Vit-Fe Phos-FA-Omega (VITAFOL  GUMMIES) 3.33-0.333-34.8 MG CHEW Chew 3 tablets by mouth daily. 90 tablet 11   Blood Pressure Monitoring (BLOOD PRESSURE KIT) DEVI 1 Device by Does not apply route once a week. 1 each 0   No current facility-administered medications on file prior to visit.      Allergies:   Allergies  Allergen Reactions   Olive Oil Nausea And Vomiting and Other (See Comments)    She can not have anything with olives in it   Latex Rash and Other (See Comments)    Patient states that she is allergic to latex condoms.  They give her a rash and a yeast infection.     Physical Exam:   Vitals:   12/27/23 1006  BP: 133/65  Pulse: 87   Sitting comfortably on the sonogram table Nonlabored breathing Normal rate and rhythm Abdomen is nontender  Thank you for the opportunity to be involved with this patient's care. Please let us  know if we can be of any further assistance.   30 minutes of time was spent reviewing the patient's chart including labs, imaging and documentation.  At least 50% of this time was spent with direct patient care discussing the diagnosis, management and prognosis of her care.  Rollo JINNY Byers MFM, [place]   12/27/2023  11:55 AM

## 2024-01-14 ENCOUNTER — Ambulatory Visit (INDEPENDENT_AMBULATORY_CARE_PROVIDER_SITE_OTHER): Payer: MEDICAID | Admitting: Obstetrics and Gynecology

## 2024-01-14 ENCOUNTER — Other Ambulatory Visit: Payer: MEDICAID

## 2024-01-14 VITALS — BP 111/75 | HR 83 | Wt 253.1 lb

## 2024-01-14 DIAGNOSIS — O360921 Maternal care for other rhesus isoimmunization, second trimester, fetus 1: Secondary | ICD-10-CM | POA: Diagnosis not present

## 2024-01-14 DIAGNOSIS — Z3A27 27 weeks gestation of pregnancy: Secondary | ICD-10-CM | POA: Diagnosis not present

## 2024-01-14 DIAGNOSIS — O0990 Supervision of high risk pregnancy, unspecified, unspecified trimester: Secondary | ICD-10-CM

## 2024-01-14 DIAGNOSIS — Z6791 Unspecified blood type, Rh negative: Secondary | ICD-10-CM | POA: Diagnosis not present

## 2024-01-14 DIAGNOSIS — O26899 Other specified pregnancy related conditions, unspecified trimester: Secondary | ICD-10-CM

## 2024-01-14 DIAGNOSIS — Z6841 Body Mass Index (BMI) 40.0 and over, adult: Secondary | ICD-10-CM | POA: Insufficient documentation

## 2024-01-14 DIAGNOSIS — Z3009 Encounter for other general counseling and advice on contraception: Secondary | ICD-10-CM | POA: Insufficient documentation

## 2024-01-14 DIAGNOSIS — O99210 Obesity complicating pregnancy, unspecified trimester: Secondary | ICD-10-CM | POA: Insufficient documentation

## 2024-01-14 MED ORDER — RHO D IMMUNE GLOBULIN 1500 UNIT/2ML IJ SOSY
300.0000 ug | PREFILLED_SYRINGE | Freq: Once | INTRAMUSCULAR | Status: AC
  Administered 2024-01-14: 300 ug via INTRAMUSCULAR

## 2024-01-14 NOTE — Progress Notes (Signed)
 Pt presents for ROB. No questions or concerns for today's visit.

## 2024-01-14 NOTE — Progress Notes (Signed)
   PRENATAL VISIT NOTE  Subjective:  Cassandra Harrison is a 33 y.o. G3P2002 at [redacted]w[redacted]d being seen today for ongoing prenatal care.  She is currently monitored for the following issues for this high-risk pregnancy and has Rh negative state in antepartum period; Asthma; Morbid obesity (HCC); Smoker within last 12 months; Female stress incontinence; Sprain of anterior talofibular ligament of left ankle; Supervision of high risk pregnancy, antepartum; Obesity during pregnancy; BMI 45.0-49.9, adult (HCC); and Unwanted fertility on their problem list.  Patient doing well with no acute concerns today. She reports no complaints.  Contractions: Not present. Vag. Bleeding: None.  Movement: Present. Denies leaking of fluid.    Patient counseled regarding bilateral tubal ligation. Reviewed that this is a permanent procedure and that she will not be able to have children after it is done. Reviewed risks of bilateral tubal ligation including infection, hemorrhage, damage to surrounding tissue and organs, risk of regret. Reviewed that bilateral tubal ligation is not 100% effective and she should take a pregnancy test if she believes for any reason she may be pregnant. Reviewed slightly increased risk of ectopic pregnancy and need to seek care if she becomes pregnant. Pt is aware of alternative forms of contraception, but she desires permanent sterilization.She understands this is an elective procedure and again affirms her desire. Consent signed.   The following portions of the patient's history were reviewed and updated as appropriate: allergies, current medications, past family history, past medical history, past social history, past surgical history and problem list. Problem list updated.  Objective:   Vitals:   01/14/24 0932  BP: 111/75  Pulse: 83  Weight: 253 lb 1.6 oz (114.8 kg)    Fetal Status: Fetal Heart Rate (bpm): 146 Fundal Height: 28 cm Movement: Present     General:  Alert, oriented and  cooperative. Patient is in no acute distress.  Skin: Skin is warm and dry. No rash noted.   Cardiovascular: Normal heart rate noted  Respiratory: Normal respiratory effort, no problems with respiration noted  Abdomen: Soft, gravid, appropriate for gestational age.  Pain/Pressure: Present     Pelvic: Cervical exam deferred        Extremities: Normal range of motion.  Edema: Trace (with prolonged standing)  Mental Status:  Normal mood and affect. Normal behavior. Normal judgment and thought content.   Assessment and Plan:  Pregnancy: G3P2002 at [redacted]w[redacted]d  1. Supervision of high risk pregnancy, antepartum (Primary) Continue routine prenatal care  - Glucose Tolerance, 2 Hours w/1 Hour - CBC - HIV Antibody (routine testing w rflx) - RPR  2. [redacted] weeks gestation of pregnancy  - Glucose Tolerance, 2 Hours w/1 Hour - CBC - HIV Antibody (routine testing w rflx) - RPR  3. Obesity during pregnancy   4. BMI 45.0-49.9, adult (HCC)   5. Rh negative state in antepartum period Rhogam today per protocol  6. Unwanted fertility Pt to sign medicaid sterilization form today   Preterm labor symptoms and general obstetric precautions including but not limited to vaginal bleeding, contractions, leaking of fluid and fetal movement were reviewed in detail with the patient.  Please refer to After Visit Summary for other counseling recommendations.   Return in about 2 weeks (around 01/28/2024) for [facility], in person.   Jerilynn Buddle, MD Faculty Attending Center for [facility]

## 2024-01-15 ENCOUNTER — Ambulatory Visit: Payer: Self-pay | Admitting: Obstetrics and Gynecology

## 2024-01-15 LAB — RPR: RPR Ser Ql: NONREACTIVE

## 2024-01-15 LAB — CBC
Hematocrit: 37.4 % (ref 34.0–46.6)
Hemoglobin: 11.7 g/dL (ref 11.1–15.9)
MCH: 27.7 pg (ref 26.6–33.0)
MCHC: 31.3 g/dL — ABNORMAL LOW (ref 31.5–35.7)
MCV: 88 fL (ref 79–97)
Platelets: 211 x10E3/uL (ref 150–450)
RBC: 4.23 x10E6/uL (ref 3.77–5.28)
RDW: 12.8 % (ref 11.7–15.4)
WBC: 8.5 x10E3/uL (ref 3.4–10.8)

## 2024-01-15 LAB — HIV ANTIBODY (ROUTINE TESTING W REFLEX): HIV Screen 4th Generation wRfx: NONREACTIVE

## 2024-01-15 LAB — GLUCOSE TOLERANCE, 2 HOURS W/ 1HR
Glucose, 1 hour: 174 mg/dL (ref 70–179)
Glucose, 2 hour: 143 mg/dL (ref 70–152)
Glucose, Fasting: 88 mg/dL (ref 70–91)

## 2024-01-28 ENCOUNTER — Ambulatory Visit (INDEPENDENT_AMBULATORY_CARE_PROVIDER_SITE_OTHER): Payer: MEDICAID | Admitting: Obstetrics and Gynecology

## 2024-01-28 VITALS — BP 123/81 | HR 96 | Wt 261.0 lb

## 2024-01-28 DIAGNOSIS — Z3A29 29 weeks gestation of pregnancy: Secondary | ICD-10-CM | POA: Diagnosis not present

## 2024-01-28 DIAGNOSIS — O99210 Obesity complicating pregnancy, unspecified trimester: Secondary | ICD-10-CM

## 2024-01-28 DIAGNOSIS — Z6791 Unspecified blood type, Rh negative: Secondary | ICD-10-CM

## 2024-01-28 DIAGNOSIS — O99213 Obesity complicating pregnancy, third trimester: Secondary | ICD-10-CM

## 2024-01-28 DIAGNOSIS — Z6841 Body Mass Index (BMI) 40.0 and over, adult: Secondary | ICD-10-CM

## 2024-01-28 DIAGNOSIS — O26893 Other specified pregnancy related conditions, third trimester: Secondary | ICD-10-CM

## 2024-01-28 DIAGNOSIS — O26899 Other specified pregnancy related conditions, unspecified trimester: Secondary | ICD-10-CM

## 2024-01-28 DIAGNOSIS — Z3009 Encounter for other general counseling and advice on contraception: Secondary | ICD-10-CM | POA: Diagnosis not present

## 2024-01-28 DIAGNOSIS — O0990 Supervision of high risk pregnancy, unspecified, unspecified trimester: Secondary | ICD-10-CM

## 2024-01-28 NOTE — Progress Notes (Signed)
   PRENATAL VISIT NOTE  Subjective:  Cassandra Harrison is a 33 y.o. G3P2002 at [redacted]w[redacted]d being seen today for ongoing prenatal care.  She is currently monitored for the following issues for this high-risk pregnancy and has Rh negative state in antepartum period; Asthma; Morbid obesity (HCC); Smoker within last 12 months; Female stress incontinence; Sprain of anterior talofibular ligament of left ankle; Supervision of high risk pregnancy, antepartum; Obesity during pregnancy; BMI 45.0-49.9, adult (HCC); and Unwanted fertility on their problem list.  Patient doing well with no acute concerns today. She reports no complaints.  Contractions: Not present. Vag. Bleeding: None.  Movement: Present. Denies leaking of fluid.   The following portions of the patient's history were reviewed and updated as appropriate: allergies, current medications, past family history, past medical history, past social history, past surgical history and problem list. Problem list updated.  Objective:   Vitals:   01/28/24 1422  BP: 123/81  Pulse: 96  Weight: 261 lb (118.4 kg)    Fetal Status: Fetal Heart Rate (bpm): 144 Fundal Height: 30 cm Movement: Present     General:  Alert, oriented and cooperative. Patient is in no acute distress.  Skin: Skin is warm and dry. No rash noted.   Cardiovascular: Normal heart rate noted  Respiratory: Normal respiratory effort, no problems with respiration noted  Abdomen: Soft, gravid, appropriate for gestational age.  Pain/Pressure: Present     Pelvic: Cervical exam deferred        Extremities: Normal range of motion.  Edema: Trace  Mental Status:  Normal mood and affect. Normal behavior. Normal judgment and thought content.   Assessment and Plan:  Pregnancy: G3P2002 at [redacted]w[redacted]d  1. Supervision of high risk pregnancy, antepartum (Primary) Continue routine prenatal care  2. [redacted] weeks gestation of pregnancy   3. Rh negative state in antepartum period Rhogam after delivery  4.  Unwanted fertility Full consent signed  5. Obesity during pregnancy   6. BMI 45.0-49.9, adult (HCC)   Preterm labor symptoms and general obstetric precautions including but not limited to vaginal bleeding, contractions, leaking of fluid and fetal movement were reviewed in detail with the patient.  Please refer to After Visit Summary for other counseling recommendations.   Return in about 2 weeks (around 02/11/2024) for [facility], in person.   Jerilynn Buddle, MD Faculty Attending Center for [facility]

## 2024-01-31 ENCOUNTER — Ambulatory Visit: Payer: MEDICAID | Attending: Maternal & Fetal Medicine | Admitting: Obstetrics and Gynecology

## 2024-01-31 ENCOUNTER — Ambulatory Visit (HOSPITAL_BASED_OUTPATIENT_CLINIC_OR_DEPARTMENT_OTHER): Payer: MEDICAID

## 2024-01-31 VITALS — BP 112/62 | HR 93

## 2024-01-31 DIAGNOSIS — O99513 Diseases of the respiratory system complicating pregnancy, third trimester: Secondary | ICD-10-CM | POA: Diagnosis not present

## 2024-01-31 DIAGNOSIS — E669 Obesity, unspecified: Secondary | ICD-10-CM

## 2024-01-31 DIAGNOSIS — O0933 Supervision of pregnancy with insufficient antenatal care, third trimester: Secondary | ICD-10-CM | POA: Diagnosis not present

## 2024-01-31 DIAGNOSIS — O0990 Supervision of high risk pregnancy, unspecified, unspecified trimester: Secondary | ICD-10-CM

## 2024-01-31 DIAGNOSIS — O99213 Obesity complicating pregnancy, third trimester: Secondary | ICD-10-CM | POA: Insufficient documentation

## 2024-01-31 DIAGNOSIS — Z3A30 30 weeks gestation of pregnancy: Secondary | ICD-10-CM

## 2024-01-31 DIAGNOSIS — Z3A29 29 weeks gestation of pregnancy: Secondary | ICD-10-CM

## 2024-01-31 DIAGNOSIS — J45909 Unspecified asthma, uncomplicated: Secondary | ICD-10-CM

## 2024-01-31 DIAGNOSIS — O99212 Obesity complicating pregnancy, second trimester: Secondary | ICD-10-CM

## 2024-01-31 NOTE — Progress Notes (Signed)
 Maternal-Fetal Medicine Consultation Name: Cassandra Harrison MRN: [id number]  G3 P2002 at 30w 2d gestation.  Patient is here for fetal growth assessment.  Her pregnancy was stated at her previous ultrasound with us  at [redacted] weeks gestation. She does not have gestational diabetes. Obstetrical history significant for 2 term vaginal deliveries. Blood pressure today at our office is 112/62 mmHg. Pregravid BMI of 44.  Ultrasound Fetal growth is appropriate for gestational age.  Amniotic fluid is normal with good fetal activity seen.  Pregravid BMI 44 Today we specifically discussed the significance of weekly antenatal testing from [redacted] weeks gestation.  Grade 3 obesity is independently associated with increased risk of stillbirth (2.5- to 3-fold), but the absolute risk is very small.  I discussed protocol of weekly antenatal testing from [redacted] weeks gestation until delivery.  Ultrasound has limitations in the resolution of ultrasound images and fetal anomalies may be missed.    I reassured the patient that she does not have any other high risk medical conditions.  Recommendations - An appointment was made for her to return in 4 weeks for fetal growth assessment and BPP. - Weekly antenatal testing from [redacted] weeks gestation until delivery.   Consultation including face-to-face (more than 50%) counseling 20 minutes.

## 2024-02-12 ENCOUNTER — Ambulatory Visit (INDEPENDENT_AMBULATORY_CARE_PROVIDER_SITE_OTHER): Payer: MEDICAID | Admitting: Obstetrics & Gynecology

## 2024-02-12 VITALS — BP 111/76 | HR 98 | Wt 261.9 lb

## 2024-02-12 DIAGNOSIS — O99210 Obesity complicating pregnancy, unspecified trimester: Secondary | ICD-10-CM | POA: Diagnosis not present

## 2024-02-12 DIAGNOSIS — O0990 Supervision of high risk pregnancy, unspecified, unspecified trimester: Secondary | ICD-10-CM | POA: Diagnosis not present

## 2024-02-12 NOTE — Progress Notes (Signed)
   PRENATAL VISIT NOTE  Subjective:  Cassandra Harrison is a 33 y.o. G3P2002 at [redacted]w[redacted]d being seen today for ongoing prenatal care.  She is currently monitored for the following issues for this high-risk pregnancy and has Rh negative state in antepartum period; Asthma; Morbid obesity (HCC); Smoker within last 12 months; Female stress incontinence; Sprain of anterior talofibular ligament of left ankle; Supervision of high risk pregnancy, antepartum; Obesity during pregnancy; BMI 45.0-49.9, adult (HCC); and Unwanted fertility on their problem list.  Patient reports no complaints.  Contractions: Not present. Vag. Bleeding: None.  Movement: Present. Denies leaking of fluid.   The following portions of the patient's history were reviewed and updated as appropriate: allergies, current medications, past family history, past medical history, past social history, past surgical history and problem list.   Objective:    Vitals:   02/12/24 1055  BP: 111/76  Pulse: 98  Weight: 261 lb 14.4 oz (118.8 kg)    Fetal Status:  Fetal Heart Rate (bpm): 136   Movement: Present    General: Alert, oriented and cooperative. Patient is in no acute distress.  Skin: Skin is warm and dry. No rash noted.   Cardiovascular: Normal heart rate noted  Respiratory: Normal respiratory effort, no problems with respiration noted  Abdomen: Soft, gravid, appropriate for gestational age.  Pain/Pressure: Present     Pelvic: Cervical exam deferred        Extremities: Normal range of motion.  Edema: Trace  Mental Status: Normal mood and affect. Normal behavior. Normal judgment and thought content.   Assessment and Plan:  Pregnancy: G3P2002 at [redacted]w[redacted]d 1. Supervision of high risk pregnancy, antepartum (Primary)   2. Obesity during pregnancy   Preterm labor symptoms and general obstetric precautions including but not limited to vaginal bleeding, contractions, leaking of fluid and fetal movement were reviewed in detail with the  patient. Please refer to After Visit Summary for other counseling recommendations.   Return in about 2 weeks (around 02/26/2024).  Future Appointments  Date Time Provider Department Center  02/28/2024  2:00 PM [facility] PROVIDER 1 [facility]  02/28/2024  2:30 PM [facility] US3 [facility]  03/06/2024  1:00 PM [facility] NURSE [facility]  03/06/2024  1:15 PM [facility] NST [facility]  03/13/2024  1:00 PM [facility] NURSE [facility]  03/13/2024  1:15 PM [facility] NST [facility]  03/20/2024  1:00 PM [facility] NURSE [facility]  03/20/2024  1:15 PM [facility] NST [facility]    Lynwood Solomons, MD

## 2024-02-12 NOTE — Progress Notes (Signed)
 Pt presents for [place]. Pt has no questions or concerns at this time.

## 2024-02-26 ENCOUNTER — Ambulatory Visit (INDEPENDENT_AMBULATORY_CARE_PROVIDER_SITE_OTHER): Payer: MEDICAID | Admitting: Obstetrics

## 2024-02-26 VITALS — BP 111/75 | HR 99 | Wt 294.0 lb

## 2024-02-26 DIAGNOSIS — O99320 Drug use complicating pregnancy, unspecified trimester: Secondary | ICD-10-CM

## 2024-02-26 DIAGNOSIS — Z302 Encounter for sterilization: Secondary | ICD-10-CM

## 2024-02-26 DIAGNOSIS — Z3009 Encounter for other general counseling and advice on contraception: Secondary | ICD-10-CM | POA: Diagnosis not present

## 2024-02-26 DIAGNOSIS — O26899 Other specified pregnancy related conditions, unspecified trimester: Secondary | ICD-10-CM | POA: Diagnosis not present

## 2024-02-26 DIAGNOSIS — K219 Gastro-esophageal reflux disease without esophagitis: Secondary | ICD-10-CM

## 2024-02-26 DIAGNOSIS — O0990 Supervision of high risk pregnancy, unspecified, unspecified trimester: Secondary | ICD-10-CM

## 2024-02-26 DIAGNOSIS — F1910 Other psychoactive substance abuse, uncomplicated: Secondary | ICD-10-CM

## 2024-02-26 DIAGNOSIS — Z87891 Personal history of nicotine dependence: Secondary | ICD-10-CM

## 2024-02-26 DIAGNOSIS — O0930 Supervision of pregnancy with insufficient antenatal care, unspecified trimester: Secondary | ICD-10-CM

## 2024-02-26 DIAGNOSIS — Z6841 Body Mass Index (BMI) 40.0 and over, adult: Secondary | ICD-10-CM

## 2024-02-26 DIAGNOSIS — Z6791 Unspecified blood type, Rh negative: Secondary | ICD-10-CM

## 2024-02-26 DIAGNOSIS — J4520 Mild intermittent asthma, uncomplicated: Secondary | ICD-10-CM

## 2024-02-26 MED ORDER — PANTOPRAZOLE SODIUM 40 MG PO TBEC: 40.0000 mg | DELAYED_RELEASE_TABLET | Freq: Two times a day (BID) | ORAL | 5 refills | Status: DC

## 2024-02-26 NOTE — Progress Notes (Signed)
 Subjective:  Cassandra Harrison is a 33 y.o. G3P2002 at [redacted]w[redacted]d being seen today for ongoing prenatal care.  She is currently monitored for the following issues for this high-risk pregnancy and has Rh negative state in antepartum period; Asthma; Morbid obesity (HCC); Smoker within last 12 months; Female stress incontinence; Sprain of anterior talofibular ligament of left ankle; Supervision of high risk pregnancy, antepartum; Obesity during pregnancy; BMI 45.0-49.9, adult (HCC); and Unwanted fertility on their problem list.  Patient reports heartburn.  Contractions: Not present. Vag. Bleeding: None.  Movement: Present. Denies leaking of fluid.   The following portions of the patient's history were reviewed and updated as appropriate: allergies, current medications, past family history, past medical history, past social history, past surgical history and problem list. Problem list updated.  Objective:   Vitals:   02/26/24 1404  BP: 111/75  Pulse: 99  Weight: 294 lb (133.4 kg)    Fetal Status:     Movement: Present     General:  Alert, oriented and cooperative. Patient is in no acute distress.  Skin: Skin is warm and dry. No rash noted.   Cardiovascular: Normal heart rate noted  Respiratory: Normal respiratory effort, no problems with respiration noted  Abdomen: Soft, gravid, appropriate for gestational age. Pain/Pressure: Absent     Pelvic:  Cervical exam deferred        Extremities: Normal range of motion.  Edema: Trace  Mental Status: Normal mood and affect. Normal behavior. Normal judgment and thought content.   Urinalysis:      Assessment and Plan:  Pregnancy: G3P2002 at 100w0d  1. Supervision of high risk pregnancy, antepartum (Primary)  2. Unwanted fertility  3. Late prenatal care affecting pregnancy, antepartum  4. Rh negative state in antepartum period - Rhogam postpartum  5. GERD without esophagitis Rx: - pantoprazole  (PROTONIX ) 40 MG tablet; Take 1 tablet (40 mg total)  by mouth 2 (two) times daily before a meal.  Dispense: 60 tablet; Refill: 5  6. Mild intermittent asthma without complication - clinically stable  7. Smoker within last 12 months - trying to quit  8. Substance abuse affecting pregnancy, antepartum [facility]) - inpatient [facility] in January 2025.  Clean since then.  9. BMI 45.0-49.9, adult (HCC)  10. Request for sterilization - consents signed 01/28/2024    Preterm labor symptoms and general obstetric precautions including but not limited to vaginal bleeding, contractions, leaking of fluid and fetal movement were reviewed in detail with the patient. Please refer to After Visit Summary for other counseling recommendations.   Return in about 2 weeks (around 03/11/2024) for ROB.   Rudy Carlin LABOR, MD 02/26/2024

## 2024-02-26 NOTE — Progress Notes (Signed)
 Denies concerns today.

## 2024-02-28 ENCOUNTER — Ambulatory Visit: Payer: MEDICAID

## 2024-02-28 ENCOUNTER — Ambulatory Visit: Payer: MEDICAID | Attending: Maternal & Fetal Medicine | Admitting: Maternal & Fetal Medicine

## 2024-02-28 ENCOUNTER — Other Ambulatory Visit: Payer: Self-pay | Admitting: *Deleted

## 2024-02-28 VITALS — BP 107/66 | HR 92

## 2024-02-28 DIAGNOSIS — O2693 Pregnancy related conditions, unspecified, third trimester: Secondary | ICD-10-CM | POA: Insufficient documentation

## 2024-02-28 DIAGNOSIS — Z3A34 34 weeks gestation of pregnancy: Secondary | ICD-10-CM | POA: Diagnosis not present

## 2024-02-28 DIAGNOSIS — O99212 Obesity complicating pregnancy, second trimester: Secondary | ICD-10-CM

## 2024-02-28 DIAGNOSIS — Z3689 Encounter for other specified antenatal screening: Secondary | ICD-10-CM | POA: Diagnosis not present

## 2024-02-28 DIAGNOSIS — J45909 Unspecified asthma, uncomplicated: Secondary | ICD-10-CM | POA: Diagnosis not present

## 2024-02-28 DIAGNOSIS — O99323 Drug use complicating pregnancy, third trimester: Secondary | ICD-10-CM | POA: Diagnosis not present

## 2024-02-28 DIAGNOSIS — O99213 Obesity complicating pregnancy, third trimester: Secondary | ICD-10-CM | POA: Diagnosis present

## 2024-02-28 DIAGNOSIS — O0990 Supervision of high risk pregnancy, unspecified, unspecified trimester: Secondary | ICD-10-CM

## 2024-02-28 DIAGNOSIS — O0933 Supervision of pregnancy with insufficient antenatal care, third trimester: Secondary | ICD-10-CM

## 2024-02-28 DIAGNOSIS — O99513 Diseases of the respiratory system complicating pregnancy, third trimester: Secondary | ICD-10-CM

## 2024-02-28 DIAGNOSIS — O358XX0 Maternal care for other (suspected) fetal abnormality and damage, not applicable or unspecified: Secondary | ICD-10-CM

## 2024-02-28 DIAGNOSIS — E669 Obesity, unspecified: Secondary | ICD-10-CM

## 2024-02-28 DIAGNOSIS — O99210 Obesity complicating pregnancy, unspecified trimester: Secondary | ICD-10-CM

## 2024-02-28 NOTE — Progress Notes (Signed)
 After review, MFM consult with provider is not indicated for today  William Glenn, DO 02/28/2024 2:59 PM  [facility]

## 2024-03-06 ENCOUNTER — Ambulatory Visit: Payer: MEDICAID

## 2024-03-06 ENCOUNTER — Ambulatory Visit: Payer: MEDICAID | Admitting: *Deleted

## 2024-03-06 ENCOUNTER — Ambulatory Visit: Payer: MEDICAID | Attending: Maternal & Fetal Medicine

## 2024-03-06 VITALS — BP 112/66 | HR 90

## 2024-03-06 DIAGNOSIS — O99210 Obesity complicating pregnancy, unspecified trimester: Secondary | ICD-10-CM

## 2024-03-06 DIAGNOSIS — O0990 Supervision of high risk pregnancy, unspecified, unspecified trimester: Secondary | ICD-10-CM

## 2024-03-06 DIAGNOSIS — Z3A35 35 weeks gestation of pregnancy: Secondary | ICD-10-CM | POA: Insufficient documentation

## 2024-03-06 DIAGNOSIS — O99213 Obesity complicating pregnancy, third trimester: Secondary | ICD-10-CM | POA: Insufficient documentation

## 2024-03-06 NOTE — Procedures (Signed)
 Cassandra Harrison 28-Apr-1991 [redacted]w[redacted]d  Fetus A Non-Stress Test Interpretation for 03/06/24  Indication: Obesity - NST only  Fetal Heart Rate A Mode: External Baseline Rate (A): 130 bpm Variability: Moderate Accelerations: 15 x 15 Decelerations: None Multiple birth?: No  Uterine Activity Mode: Palpation, Toco Contraction Frequency (min): none noted Resting Tone Palpated: Relaxed  Interpretation (Fetal Testing) Nonstress Test Interpretation: Reactive Comments: Reviewed with Dr. Ileana

## 2024-03-11 ENCOUNTER — Ambulatory Visit (INDEPENDENT_AMBULATORY_CARE_PROVIDER_SITE_OTHER): Payer: MEDICAID | Admitting: Obstetrics and Gynecology

## 2024-03-11 ENCOUNTER — Encounter: Payer: Self-pay | Admitting: Obstetrics and Gynecology

## 2024-03-11 ENCOUNTER — Other Ambulatory Visit (HOSPITAL_COMMUNITY)
Admission: RE | Admit: 2024-03-11 | Discharge: 2024-03-11 | Disposition: A | Discharge status: Home / Self Care | Payer: MEDICAID | Source: Ambulatory Visit | Attending: Obstetrics and Gynecology | Admitting: Obstetrics and Gynecology

## 2024-03-11 VITALS — BP 114/77 | HR 99 | Wt 266.0 lb

## 2024-03-11 DIAGNOSIS — Z6841 Body Mass Index (BMI) 40.0 and over, adult: Secondary | ICD-10-CM

## 2024-03-11 DIAGNOSIS — Z87891 Personal history of nicotine dependence: Secondary | ICD-10-CM

## 2024-03-11 DIAGNOSIS — O0990 Supervision of high risk pregnancy, unspecified, unspecified trimester: Secondary | ICD-10-CM | POA: Insufficient documentation

## 2024-03-11 DIAGNOSIS — Z3A36 36 weeks gestation of pregnancy: Secondary | ICD-10-CM | POA: Insufficient documentation

## 2024-03-11 DIAGNOSIS — Z302 Encounter for sterilization: Secondary | ICD-10-CM

## 2024-03-11 DIAGNOSIS — O26899 Other specified pregnancy related conditions, unspecified trimester: Secondary | ICD-10-CM

## 2024-03-11 DIAGNOSIS — Z6791 Unspecified blood type, Rh negative: Secondary | ICD-10-CM

## 2024-03-11 NOTE — Progress Notes (Signed)
   PRENATAL VISIT NOTE  Subjective:  Cassandra Harrison is a 33 y.o. G3P2002 at [redacted]w[redacted]d being seen today for ongoing prenatal care.  She is currently monitored for the following issues for this high-risk pregnancy and has Rh negative state in antepartum period; Asthma; Morbid obesity (HCC); Smoker within last 12 months; Female stress incontinence; Sprain of anterior talofibular ligament of left ankle; Supervision of high risk pregnancy, antepartum; Obesity during pregnancy; BMI 45.0-49.9, adult (HCC); and Unwanted fertility on their problem list.  Patient reports no complaints.  Contractions: Irritability. Vag. Bleeding: None.  Movement: Present. Denies leaking of fluid.   The following portions of the patient's history were reviewed and updated as appropriate: allergies, current medications, past family history, past medical history, past social history, past surgical history and problem list.   Objective:    Vitals:   03/11/24 1353  BP: 114/77  Pulse: 99  Weight: 266 lb (120.7 kg)    Fetal Status:  Fetal Heart Rate (bpm): 141   Movement: Present Presentation: Vertex  General: Alert, oriented and cooperative. Patient is in no acute distress.  Skin: Skin is warm and dry. No rash noted.   Cardiovascular: Normal heart rate noted  Respiratory: Normal respiratory effort, no problems with respiration noted  Abdomen: Soft, gravid, appropriate for gestational age.  Pain/Pressure: Present     Pelvic: Cervical exam performed in the presence of a chaperone Dilation: 1 Effacement (%): Thick Station: Ballotable  Extremities: Normal range of motion.  Edema: Trace  Mental Status: Normal mood and affect. Normal behavior. Normal judgment and thought content.   Assessment and Plan:  Pregnancy: G3P2002 at [redacted]w[redacted]d 1. Supervision of high risk pregnancy, antepartum (Primary) BP and FHR normal Doing well, feeling regular movement    2. Rh negative state in antepartum period Rhogam pp   3. Smoker within  last 12 months Has cut back, she was smoking 4/day now down to 1 cigarette a day   4. BMI 45.0-49.9, adult [facility]) Following MFM Normal growth, 8/8 BPP 9/19 Planning weekly antenatal testing  Schedule IOL next visit   5. Request for sterilization Previously signed BTL consent and is in media tab  6. [redacted] weeks gestation of pregnancy Swabs collected today     Preterm labor symptoms and general obstetric precautions including but not limited to vaginal bleeding, contractions, leaking of fluid and fetal movement were reviewed in detail with the patient. Please refer to After Visit Summary for other counseling recommendations.   Return in about 1 week (around 03/18/2024) for OB VISIT (MD or APP).  Future Appointments  Date Time Provider Department Center  03/13/2024  1:00 PM [facility] NURSE [facility]  03/13/2024  1:15 PM [facility] NST [facility]  03/18/2024  3:50 PM Zina Jerilynn LABOR, MD [facility] None  03/20/2024  1:00 PM [facility] NURSE [facility]  03/20/2024  1:15 PM [facility] NST [facility]  03/30/2024  2:15 PM [facility] PROVIDER 1 [facility]  03/30/2024  2:30 PM [facility] US5 [facility]    Nidia Daring, FNP

## 2024-03-11 NOTE — Progress Notes (Signed)
 Pt presents for ROB visit. No concerns

## 2024-03-12 ENCOUNTER — Ambulatory Visit: Payer: Self-pay | Admitting: Obstetrics and Gynecology

## 2024-03-12 LAB — CERVICOVAGINAL ANCILLARY ONLY
Chlamydia: NEGATIVE
Comment: NEGATIVE
Comment: NEGATIVE
Comment: NORMAL
Neisseria Gonorrhea: NEGATIVE
Trichomonas: NEGATIVE

## 2024-03-13 ENCOUNTER — Ambulatory Visit: Payer: MEDICAID | Admitting: *Deleted

## 2024-03-13 ENCOUNTER — Ambulatory Visit: Payer: MEDICAID | Attending: Obstetrics and Gynecology | Admitting: *Deleted

## 2024-03-13 VITALS — BP 106/62 | HR 88

## 2024-03-13 DIAGNOSIS — O99213 Obesity complicating pregnancy, third trimester: Secondary | ICD-10-CM | POA: Insufficient documentation

## 2024-03-13 DIAGNOSIS — O0933 Supervision of pregnancy with insufficient antenatal care, third trimester: Secondary | ICD-10-CM | POA: Insufficient documentation

## 2024-03-13 DIAGNOSIS — O99210 Obesity complicating pregnancy, unspecified trimester: Secondary | ICD-10-CM

## 2024-03-13 DIAGNOSIS — O0990 Supervision of high risk pregnancy, unspecified, unspecified trimester: Secondary | ICD-10-CM

## 2024-03-13 DIAGNOSIS — Z3A36 36 weeks gestation of pregnancy: Secondary | ICD-10-CM | POA: Insufficient documentation

## 2024-03-13 NOTE — Procedures (Signed)
 Cassandra Harrison [birth year] [redacted]w[redacted]d  Fetus A Non-Stress Test Interpretation for 03/13/24- NST only  Indication: M.O.  Fetal Heart Rate A Mode: External Baseline Rate (A): 140 bpm Variability: Moderate Accelerations: 15 x 15 Decelerations: None Multiple birth?: No  Uterine Activity Mode: Toco Contraction Frequency (min): none Resting Tone Palpated: Relaxed  Interpretation (Fetal Testing) Nonstress Test Interpretation: Reactive Comments: tracing reviewed by Dr. arna

## 2024-03-15 LAB — CULTURE, BETA STREP (GROUP B ONLY): Strep Gp B Culture: NEGATIVE

## 2024-03-18 ENCOUNTER — Ambulatory Visit (INDEPENDENT_AMBULATORY_CARE_PROVIDER_SITE_OTHER): Payer: MEDICAID | Admitting: Obstetrics and Gynecology

## 2024-03-18 VITALS — BP 110/75 | HR 88 | Wt 268.0 lb

## 2024-03-18 DIAGNOSIS — O26899 Other specified pregnancy related conditions, unspecified trimester: Secondary | ICD-10-CM

## 2024-03-18 DIAGNOSIS — Z6791 Unspecified blood type, Rh negative: Secondary | ICD-10-CM

## 2024-03-18 DIAGNOSIS — O0990 Supervision of high risk pregnancy, unspecified, unspecified trimester: Secondary | ICD-10-CM | POA: Diagnosis not present

## 2024-03-18 DIAGNOSIS — Z3A37 37 weeks gestation of pregnancy: Secondary | ICD-10-CM

## 2024-03-18 DIAGNOSIS — Z6841 Body Mass Index (BMI) 40.0 and over, adult: Secondary | ICD-10-CM

## 2024-03-18 DIAGNOSIS — Z3009 Encounter for other general counseling and advice on contraception: Secondary | ICD-10-CM

## 2024-03-18 NOTE — Progress Notes (Signed)
   PRENATAL VISIT NOTE  Subjective:  Cassandra Harrison is a 32 y.o. G3P2002 at [redacted]w[redacted]d being seen today for ongoing prenatal care.  She is currently monitored for the following issues for this high-risk pregnancy and has Rh negative state in antepartum period; Asthma; Morbid obesity (HCC); Smoker within last 12 months; Female stress incontinence; Sprain of anterior talofibular ligament of left ankle; Supervision of high risk pregnancy, antepartum; Obesity during pregnancy; BMI 45.0-49.9, adult (HCC); and Unwanted fertility on their problem list.  Patient doing well with no acute concerns today. She reports generalized maternal fatigue.  Contractions: Irritability. Vag. Bleeding: None.  Movement: Present. Denies leaking of fluid.   The following portions of the patient's history were reviewed and updated as appropriate: allergies, current medications, past family history, past medical history, past social history, past surgical history and problem list. Problem list updated.  Objective:   Vitals:   03/18/24 1555  BP: 110/75  Pulse: 88  Weight: 268 lb (121.6 kg)    Fetal Status: Fetal Heart Rate (bpm): 158 Fundal Height: 38 cm Movement: Present     General:  Alert, oriented and cooperative. Patient is in no acute distress.  Skin: Skin is warm and dry. No rash noted.   Cardiovascular: Normal heart rate noted  Respiratory: Normal respiratory effort, no problems with respiration noted  Abdomen: Soft, gravid, appropriate for gestational age.  Pain/Pressure: Present     Pelvic: Cervical exam deferred        Extremities: Normal range of motion.  Edema: Trace  Mental Status:  Normal mood and affect. Normal behavior. Normal judgment and thought content.   Assessment and Plan:  Pregnancy: G3P2002 at [redacted]w[redacted]d  1. [redacted] weeks gestation of pregnancy (Primary)   2. Unwanted fertility BTL consent previously signed  3. Supervision of high risk pregnancy, antepartum Continue routine prenatal care Pt  has growth scan/follow up scheduled 10/20   4. Rh negative state in antepartum period Rhogam post delivery  5. BMI 45.0-49.9, adult [facility])   Term labor symptoms and general obstetric precautions including but not limited to vaginal bleeding, contractions, leaking of fluid and fetal movement were reviewed in detail with the patient.  Please refer to After Visit Summary for other counseling recommendations.   Return in about 1 week (around 03/25/2024) for [facility], in person.   Jerilynn Buddle, MD Faculty Attending Center for [facility]

## 2024-03-20 ENCOUNTER — Ambulatory Visit: Payer: MEDICAID

## 2024-03-20 ENCOUNTER — Other Ambulatory Visit: Payer: Self-pay | Admitting: *Deleted

## 2024-03-20 ENCOUNTER — Ambulatory Visit: Payer: MEDICAID | Attending: Maternal & Fetal Medicine | Admitting: *Deleted

## 2024-03-20 VITALS — BP 116/73 | HR 94

## 2024-03-20 DIAGNOSIS — O99213 Obesity complicating pregnancy, third trimester: Secondary | ICD-10-CM | POA: Diagnosis present

## 2024-03-20 DIAGNOSIS — Z3A37 37 weeks gestation of pregnancy: Secondary | ICD-10-CM | POA: Diagnosis present

## 2024-03-20 DIAGNOSIS — O0990 Supervision of high risk pregnancy, unspecified, unspecified trimester: Secondary | ICD-10-CM

## 2024-03-20 DIAGNOSIS — E669 Obesity, unspecified: Secondary | ICD-10-CM | POA: Diagnosis not present

## 2024-03-20 DIAGNOSIS — O0933 Supervision of pregnancy with insufficient antenatal care, third trimester: Secondary | ICD-10-CM | POA: Insufficient documentation

## 2024-03-20 NOTE — Procedures (Signed)
 KARLEE STAFF 04/27/1991 [redacted]w[redacted]d  Fetus A Non-Stress Test Interpretation for 03/20/24  Indication: Late PNC, Obesity  Fetal Heart Rate A Mode: External Baseline Rate (A): 145 bpm Variability: Moderate Accelerations: 15 x 15 Decelerations: None Multiple birth?: No  Uterine Activity Mode: Toco Contraction Frequency (min): UI noted Resting Tone Palpated: Relaxed  Interpretation (Fetal Testing) Nonstress Test Interpretation: Reactive Comments: Tracing reviewed by Dr. William

## 2024-03-24 ENCOUNTER — Ambulatory Visit: Payer: MEDICAID | Admitting: Obstetrics

## 2024-03-24 VITALS — BP 109/72 | HR 90 | Wt 272.0 lb

## 2024-03-24 DIAGNOSIS — Z87891 Personal history of nicotine dependence: Secondary | ICD-10-CM

## 2024-03-24 DIAGNOSIS — O0990 Supervision of high risk pregnancy, unspecified, unspecified trimester: Secondary | ICD-10-CM | POA: Diagnosis not present

## 2024-03-24 DIAGNOSIS — O26899 Other specified pregnancy related conditions, unspecified trimester: Secondary | ICD-10-CM

## 2024-03-24 DIAGNOSIS — O99210 Obesity complicating pregnancy, unspecified trimester: Secondary | ICD-10-CM

## 2024-03-24 DIAGNOSIS — N393 Stress incontinence (female) (male): Secondary | ICD-10-CM

## 2024-03-24 DIAGNOSIS — Z3009 Encounter for other general counseling and advice on contraception: Secondary | ICD-10-CM

## 2024-03-24 DIAGNOSIS — Z6791 Unspecified blood type, Rh negative: Secondary | ICD-10-CM

## 2024-03-24 MED ORDER — CETIRIZINE HCL 10 MG PO TABS: 10.0000 mg | ORAL_TABLET | Freq: Every day | ORAL | 11 refills | Status: DC

## 2024-03-24 NOTE — Progress Notes (Signed)
 Subjective:  Cassandra Harrison is a 33 y.o. G3P2002 at [redacted]w[redacted]d being seen today for ongoing prenatal care.  She is currently monitored for the following issues for this high-risk pregnancy and has Rh negative state in antepartum period; Asthma; Morbid obesity (HCC); Smoker within last 12 months; Female stress incontinence; Sprain of anterior talofibular ligament of left ankle; Supervision of high risk pregnancy, antepartum; Obesity during pregnancy; BMI 45.0-49.9, adult (HCC); and Unwanted fertility on their problem list.  Patient reports no complaints.  Contractions: Irritability. Vag. Bleeding: None.  Movement: Present. Denies leaking of fluid.   The following portions of the patient's history were reviewed and updated as appropriate: allergies, current medications, past family history, past medical history, past social history, past surgical history and problem list. Problem list updated.  Objective:   Vitals:   03/24/24 1340  BP: 109/72  Pulse: 90  Weight: 272 lb (123.4 kg)    Fetal Status: Fetal Heart Rate (bpm): 147   Movement: Present     General:  Alert, oriented and cooperative. Patient is in no acute distress.  Skin: Skin is warm and dry. No rash noted.   Cardiovascular: Normal heart rate noted  Respiratory: Normal respiratory effort, no problems with respiration noted  Abdomen: Soft, gravid, appropriate for gestational age. Pain/Pressure: Present     Pelvic:  Cervical exam deferred        Extremities: Normal range of motion.  Edema: None  Mental Status: Normal mood and affect. Normal behavior. Normal judgment and thought content.   Urinalysis:      Assessment and Plan:  Pregnancy: G3P2002 at [redacted]w[redacted]d  1. Supervision of high risk pregnancy, antepartum (Primary)  2. Unwanted fertility  3. Rh negative state in antepartum period - Rhogam postpartum  4. Smoker within last 12 months  5. Female stress incontinence  6. Obesity during pregnancy   Term labor symptoms and  general obstetric precautions including but not limited to vaginal bleeding, contractions, leaking of fluid and fetal movement were reviewed in detail with the patient. Please refer to After Visit Summary for other counseling recommendations.   Return in about 1 week (around 03/31/2024).   Rudy Carlin LABOR, MD 03/24/2024

## 2024-03-27 ENCOUNTER — Ambulatory Visit: Payer: MEDICAID | Attending: Maternal & Fetal Medicine | Admitting: *Deleted

## 2024-03-27 VITALS — BP 123/79

## 2024-03-27 DIAGNOSIS — O99213 Obesity complicating pregnancy, third trimester: Secondary | ICD-10-CM | POA: Insufficient documentation

## 2024-03-27 DIAGNOSIS — Z3A38 38 weeks gestation of pregnancy: Secondary | ICD-10-CM | POA: Insufficient documentation

## 2024-03-27 NOTE — Procedures (Signed)
 Cassandra Harrison 1990/10/07 [redacted]w[redacted]d  Fetus A Non-Stress Test Interpretation for 03/27/24-nst only  Indication: M.O.  Fetal Heart Rate A Mode: External Baseline Rate (A): 140 bpm Variability: Moderate Accelerations: 15 x 15 Decelerations: None Multiple birth?: No  Uterine Activity Mode: Toco Contraction Frequency (min): occas Contraction Duration (sec): 40-50 Contraction Quality: Mild Resting Tone Palpated: Relaxed  Interpretation (Fetal Testing) Nonstress Test Interpretation: Reactive Comments: Tracing reviewed by Dr. Ileana

## 2024-03-28 ENCOUNTER — Encounter: Payer: Self-pay | Admitting: Obstetrics and Gynecology

## 2024-03-30 ENCOUNTER — Telehealth (HOSPITAL_COMMUNITY): Payer: Self-pay | Admitting: *Deleted

## 2024-03-30 ENCOUNTER — Ambulatory Visit (HOSPITAL_BASED_OUTPATIENT_CLINIC_OR_DEPARTMENT_OTHER): Payer: MEDICAID

## 2024-03-30 ENCOUNTER — Ambulatory Visit: Payer: MEDICAID | Attending: Maternal & Fetal Medicine | Admitting: Obstetrics

## 2024-03-30 ENCOUNTER — Encounter (HOSPITAL_COMMUNITY): Payer: Self-pay | Admitting: *Deleted

## 2024-03-30 DIAGNOSIS — O0933 Supervision of pregnancy with insufficient antenatal care, third trimester: Secondary | ICD-10-CM | POA: Diagnosis not present

## 2024-03-30 DIAGNOSIS — O99513 Diseases of the respiratory system complicating pregnancy, third trimester: Secondary | ICD-10-CM | POA: Diagnosis not present

## 2024-03-30 DIAGNOSIS — O358XX0 Maternal care for other (suspected) fetal abnormality and damage, not applicable or unspecified: Secondary | ICD-10-CM

## 2024-03-30 DIAGNOSIS — Z3A38 38 weeks gestation of pregnancy: Secondary | ICD-10-CM

## 2024-03-30 DIAGNOSIS — O99213 Obesity complicating pregnancy, third trimester: Secondary | ICD-10-CM | POA: Diagnosis not present

## 2024-03-30 DIAGNOSIS — Z3A39 39 weeks gestation of pregnancy: Secondary | ICD-10-CM | POA: Insufficient documentation

## 2024-03-30 DIAGNOSIS — J45909 Unspecified asthma, uncomplicated: Secondary | ICD-10-CM

## 2024-03-30 DIAGNOSIS — Z3689 Encounter for other specified antenatal screening: Secondary | ICD-10-CM | POA: Diagnosis not present

## 2024-03-30 DIAGNOSIS — E669 Obesity, unspecified: Secondary | ICD-10-CM

## 2024-03-30 NOTE — Progress Notes (Signed)
 MFM Consult Note  Cassandra Harrison is currently at 38 weeks and 5 days.  She has been followed due to maternal obesity with a BMI of 44.  She denies any problems since her last exam.  Sonographic findings Single intrauterine pregnancy. Fetal cardiac activity: Observed. Presentation: Cephalic. Fetal biometry shows the estimated fetal weight of 8 pounds 8 ounces which measures at the 86th percentile. Amniotic fluid: Within normal limits.  AFI 12.27 cm.  MVP: 3.89 cm. Placenta: Posterior. BPP: 8/8.   There are limitations of prenatal ultrasound such as the inability to detect certain abnormalities due to poor visualization. Various factors such as fetal position, gestational age and maternal body habitus may increase the difficulty in visualizing the fetal anatomy.    Due to maternal obesity, delivery is recommended at around 39 weeks.    The patient is already scheduled for induction of labor on April 03, 2024 (in 4 days).  No further exams were scheduled in our office.    The patient stated that all of her questions were answered today.  A total of 10 minutes was spent counseling and coordinating the care for this patient.  Greater than 50% of the time was spent in direct face-to-face contact.

## 2024-03-30 NOTE — Telephone Encounter (Signed)
 Preadmission screen

## 2024-04-03 ENCOUNTER — Inpatient Hospital Stay (HOSPITAL_COMMUNITY): Payer: MEDICAID | Attending: Family Medicine

## 2024-04-04 ENCOUNTER — Inpatient Hospital Stay (HOSPITAL_COMMUNITY)
Admission: AD | Admit: 2024-04-04 | Discharge: 2024-04-06 | DRG: 798 | Disposition: A | Discharge status: Home / Self Care | Payer: MEDICAID | Attending: Family Medicine | Admitting: Family Medicine

## 2024-04-04 ENCOUNTER — Encounter (HOSPITAL_COMMUNITY): Payer: Self-pay

## 2024-04-04 ENCOUNTER — Encounter (HOSPITAL_COMMUNITY): Payer: Self-pay | Admitting: Family Medicine

## 2024-04-04 ENCOUNTER — Inpatient Hospital Stay (HOSPITAL_COMMUNITY): Payer: MEDICAID

## 2024-04-04 ENCOUNTER — Other Ambulatory Visit: Payer: Self-pay

## 2024-04-04 DIAGNOSIS — Z833 Family history of diabetes mellitus: Secondary | ICD-10-CM | POA: Diagnosis not present

## 2024-04-04 DIAGNOSIS — O0990 Supervision of high risk pregnancy, unspecified, unspecified trimester: Principal | ICD-10-CM

## 2024-04-04 DIAGNOSIS — O9952 Diseases of the respiratory system complicating childbirth: Secondary | ICD-10-CM | POA: Diagnosis present

## 2024-04-04 DIAGNOSIS — Z23 Encounter for immunization: Secondary | ICD-10-CM

## 2024-04-04 DIAGNOSIS — O864 Pyrexia of unknown origin following delivery: Secondary | ICD-10-CM | POA: Diagnosis not present

## 2024-04-04 DIAGNOSIS — Z6841 Body Mass Index (BMI) 40.0 and over, adult: Secondary | ICD-10-CM | POA: Diagnosis not present

## 2024-04-04 DIAGNOSIS — Z8616 Personal history of COVID-19: Secondary | ICD-10-CM

## 2024-04-04 DIAGNOSIS — Z6791 Unspecified blood type, Rh negative: Secondary | ICD-10-CM | POA: Diagnosis not present

## 2024-04-04 DIAGNOSIS — Z3490 Encounter for supervision of normal pregnancy, unspecified, unspecified trimester: Principal | ICD-10-CM | POA: Diagnosis present

## 2024-04-04 DIAGNOSIS — Z302 Encounter for sterilization: Secondary | ICD-10-CM

## 2024-04-04 DIAGNOSIS — O99330 Smoking (tobacco) complicating pregnancy, unspecified trimester: Secondary | ICD-10-CM | POA: Diagnosis present

## 2024-04-04 DIAGNOSIS — Z3A39 39 weeks gestation of pregnancy: Secondary | ICD-10-CM

## 2024-04-04 DIAGNOSIS — Z8249 Family history of ischemic heart disease and other diseases of the circulatory system: Secondary | ICD-10-CM | POA: Diagnosis not present

## 2024-04-04 DIAGNOSIS — F17210 Nicotine dependence, cigarettes, uncomplicated: Secondary | ICD-10-CM | POA: Diagnosis present

## 2024-04-04 DIAGNOSIS — Z720 Tobacco use: Secondary | ICD-10-CM | POA: Diagnosis present

## 2024-04-04 DIAGNOSIS — O99214 Obesity complicating childbirth: Secondary | ICD-10-CM | POA: Diagnosis present

## 2024-04-04 DIAGNOSIS — O26893 Other specified pregnancy related conditions, third trimester: Secondary | ICD-10-CM | POA: Diagnosis present

## 2024-04-04 DIAGNOSIS — J45909 Unspecified asthma, uncomplicated: Secondary | ICD-10-CM | POA: Diagnosis present

## 2024-04-04 DIAGNOSIS — Z3009 Encounter for other general counseling and advice on contraception: Secondary | ICD-10-CM | POA: Diagnosis present

## 2024-04-04 DIAGNOSIS — O99334 Smoking (tobacco) complicating childbirth: Secondary | ICD-10-CM | POA: Diagnosis not present

## 2024-04-04 DIAGNOSIS — O411230 Chorioamnionitis, third trimester, not applicable or unspecified: Secondary | ICD-10-CM | POA: Diagnosis not present

## 2024-04-04 LAB — TYPE AND SCREEN
ABO/RH(D): B NEG
Antibody Screen: NEGATIVE

## 2024-04-04 LAB — CBC
HCT: 36.2 % (ref 36.0–46.0)
Hemoglobin: 11.9 g/dL — ABNORMAL LOW (ref 12.0–15.0)
MCH: 26.3 pg (ref 26.0–34.0)
MCHC: 32.9 g/dL (ref 30.0–36.0)
MCV: 80.1 fL (ref 80.0–100.0)
Platelets: 239 K/uL (ref 150–400)
RBC: 4.52 MIL/uL (ref 3.87–5.11)
RDW: 14.6 % (ref 11.5–15.5)
WBC: 7.8 K/uL (ref 4.0–10.5)
nRBC: 0 % (ref 0.0–0.2)

## 2024-04-04 LAB — RPR: RPR Ser Ql: NONREACTIVE

## 2024-04-04 MED ORDER — ONDANSETRON HCL 4 MG/2ML IJ SOLN
4.0000 mg | Freq: Four times a day (QID) | INTRAMUSCULAR | Status: DC | PRN
  Administered 2024-04-04: 4 mg via INTRAVENOUS
  Filled 2024-04-04: qty 2

## 2024-04-04 MED ORDER — SOD CITRATE-CITRIC ACID 500-334 MG/5ML PO SOLN
30.0000 mL | ORAL | Status: DC | PRN
Start: 2024-04-04 — End: 2024-04-05

## 2024-04-04 MED ORDER — ACETAMINOPHEN 500 MG PO TABS
1000.0000 mg | ORAL_TABLET | Freq: Four times a day (QID) | ORAL | Status: DC | PRN
Start: 2024-04-04 — End: 2024-04-06

## 2024-04-04 MED ORDER — OXYTOCIN-SODIUM CHLORIDE 30-0.9 UT/500ML-% IV SOLN
1.0000 m[IU]/min | INTRAVENOUS | Status: DC
  Administered 2024-04-04: 2 m[IU]/min via INTRAVENOUS
  Filled 2024-04-04: qty 500

## 2024-04-04 MED ORDER — ACETAMINOPHEN 325 MG PO TABS: 650.0000 mg | ORAL_TABLET | ORAL | Status: DC | PRN

## 2024-04-04 MED ORDER — FENTANYL-BUPIVACAINE-NACL 0.5-0.125-0.9 MG/250ML-% EP SOLN
12.0000 mL/h | EPIDURAL | Status: DC | PRN
  Administered 2024-04-04: 12 mL/h via EPIDURAL
  Filled 2024-04-04: qty 250

## 2024-04-04 MED ORDER — EPHEDRINE 5 MG/ML INJ
10.0000 mg | INTRAVENOUS | Status: DC | PRN
  Filled 2024-04-04: qty 5

## 2024-04-04 MED ORDER — LIDOCAINE HCL (PF) 1 % IJ SOLN
INTRAMUSCULAR | Status: DC | PRN
  Administered 2024-04-04 (×2): 5 mL via EPIDURAL

## 2024-04-04 MED ORDER — EPHEDRINE 5 MG/ML INJ
10.0000 mg | INTRAVENOUS | Status: DC | PRN
  Administered 2024-04-04: 10 mg via INTRAVENOUS

## 2024-04-04 MED ORDER — MISOPROSTOL 50MCG HALF TABLET: 50.0000 ug | ORAL_TABLET | ORAL | Status: DC | PRN

## 2024-04-04 MED ORDER — MISOPROSTOL 25 MCG QUARTER TABLET
25.0000 ug | ORAL_TABLET | Freq: Once | ORAL | Status: AC
  Administered 2024-04-04: 25 ug via VAGINAL
  Filled 2024-04-04: qty 1

## 2024-04-04 MED ORDER — FENTANYL CITRATE (PF) 100 MCG/2ML IJ SOLN
INTRAMUSCULAR | Status: AC
  Administered 2024-04-04: 100 ug via INTRAVENOUS
  Filled 2024-04-04: qty 2

## 2024-04-04 MED ORDER — ACETAMINOPHEN 500 MG PO TABS
1000.0000 mg | ORAL_TABLET | Freq: Once | ORAL | Status: AC
Start: 2024-04-05 — End: 2024-04-04
  Administered 2024-04-04: 1000 mg via ORAL
  Filled 2024-04-04: qty 2

## 2024-04-04 MED ORDER — PHENYLEPHRINE 80 MCG/ML (10ML) SYRINGE FOR IV PUSH (FOR BLOOD PRESSURE SUPPORT): 80.0000 ug | PREFILLED_SYRINGE | INTRAVENOUS | Status: DC | PRN

## 2024-04-04 MED ORDER — OXYTOCIN-SODIUM CHLORIDE 30-0.9 UT/500ML-% IV SOLN
2.5000 [IU]/h | INTRAVENOUS | Status: DC
  Administered 2024-04-04: 2.5 [IU]/h via INTRAVENOUS

## 2024-04-04 MED ORDER — LACTATED RINGERS IV SOLN: 500.0000 mL | Freq: Once | INTRAVENOUS | Status: DC

## 2024-04-04 MED ORDER — DIPHENHYDRAMINE HCL 50 MG/ML IJ SOLN
12.5000 mg | INTRAMUSCULAR | Status: AC | PRN
  Administered 2024-04-04 – 2024-04-05 (×3): 12.5 mg via INTRAVENOUS
  Filled 2024-04-04: qty 1

## 2024-04-04 MED ORDER — LIDOCAINE HCL (PF) 1 % IJ SOLN: 30.0000 mL | INTRAMUSCULAR | Status: DC | PRN

## 2024-04-04 MED ORDER — LACTATED RINGERS IV SOLN: INTRAVENOUS | Status: DC

## 2024-04-04 MED ORDER — TERBUTALINE SULFATE 1 MG/ML IJ SOLN: 0.2500 mg | Freq: Once | INTRAMUSCULAR | Status: DC | PRN

## 2024-04-04 MED ORDER — OXYTOCIN BOLUS FROM INFUSION
333.0000 mL | Freq: Once | INTRAVENOUS | Status: AC
  Administered 2024-04-04: 333 mL via INTRAVENOUS

## 2024-04-04 MED ORDER — MISOPROSTOL 50MCG HALF TABLET
50.0000 ug | ORAL_TABLET | Freq: Once | ORAL | Status: AC
  Administered 2024-04-04: 50 ug via ORAL
  Filled 2024-04-04: qty 1

## 2024-04-04 MED ORDER — LACTATED RINGERS IV SOLN
500.0000 mL | INTRAVENOUS | Status: DC | PRN
  Administered 2024-04-04: 500 mL via INTRAVENOUS

## 2024-04-04 MED ORDER — FENTANYL CITRATE (PF) 100 MCG/2ML IJ SOLN
50.0000 ug | INTRAMUSCULAR | Status: DC | PRN
  Administered 2024-04-04: 100 ug via INTRAVENOUS
  Filled 2024-04-04: qty 2

## 2024-04-04 NOTE — Anesthesia Procedure Notes (Signed)
 Epidural Patient location during procedure: OB Start time: 04/04/2024 10:10 AM End time: 04/04/2024 10:18 AM  Staffing Anesthesiologist: Erma Thom SAUNDERS, MD Performed: anesthesiologist   Preanesthetic Checklist Completed: patient identified, IV checked, risks and benefits discussed, monitors and equipment checked, pre-op evaluation and timeout performed  Epidural Patient position: sitting Prep: DuraPrep Patient monitoring: heart rate, cardiac monitor, continuous pulse ox and blood pressure Approach: midline Location: L3-L4 Injection technique: LOR air  Needle:  Needle type: Tuohy  Needle gauge: 17 G Needle length: 9 cm Needle insertion depth: 8 cm Catheter type: closed end flexible Catheter size: 19 Gauge Catheter at skin depth: 13 cm Test dose: negative  Assessment Sensory level: T8  Additional Notes Patient identified. Risks/Benefits/Options discussed with patient including but not limited to bleeding, infection, nerve damage, paralysis, failed block, incomplete pain control, headache, blood pressure changes, nausea, vomiting, reactions to medication both or allergic, itching and postpartum back pain. Confirmed with bedside nurse the patient's most recent platelet count. Confirmed with patient that they are not currently taking any anticoagulation, have any bleeding history or any family history of bleeding disorders. Patient expressed understanding and wished to proceed. All questions were answered. Sterile technique was used throughout the entire procedure. Please see nursing notes for vital signs. Test dose was given through epidural catheter and negative prior to continuing to dose epidural or start infusion. Warning signs of high block given to the patient including shortness of breath, tingling/numbness in hands, complete motor block, or any concerning symptoms with instructions to call for help. Patient was given instructions on fall risk and not to get out of bed. All  questions and concerns addressed with instructions to call with any issues or inadequate analgesia.  Reason for block:procedure for pain

## 2024-04-04 NOTE — Progress Notes (Signed)
 Labor Progress Note Cassandra Harrison is a 33 y.o. G3P2002 at [redacted]w[redacted]d presented for IOL for maternal obesity.  S:  Resting in bed on left side, comfortable with epidural.  O:  BP (!) 112/53   Pulse 68   Temp 98.3 F (36.8 C) (Oral)   Resp 16   Ht 5' 2.25 (1.581 m)   Wt 126.1 kg   LMP  (LMP Unknown)   SpO2 100%   BMI 50.44 kg/m  EFM: baseline 145 bpm/ moderate variability/ 15x15 accels/ no decels  Toco/IUPC: UC 2-3 min SVE: Dilation: 9 Effacement (%): 100 Station: 0 Presentation: Vertex Exam by:: Duwaine Brain RN  A/P: 33 y.o. G3P2002 [redacted]w[redacted]d  1. Labor: IOL progressing normally, Pitocin  infusing at 30mu/min 2. FWB: Cat 1 3. Pain: Epidural 4. GBS: negative  Anticipate NSVD.  [name], Student-MidWife 2:02 PM

## 2024-04-04 NOTE — Anesthesia Preprocedure Evaluation (Signed)
 Anesthesia Evaluation  Patient identified by MRN, date of birth, ID band Patient awake    Reviewed: NPO status , Patient's Chart, lab work & pertinent test results  History of Anesthesia Complications Negative for: history of anesthetic complications  Airway Mallampati: III  TM Distance: >3 FB Neck ROM: Full    Dental  (+) Teeth Intact   Pulmonary asthma , Current Smoker   Pulmonary exam normal        Cardiovascular negative cardio ROS  Rhythm:Regular Rate:Normal     Neuro/Psych  Headaches Hx of chronic back pain with bulging disc.   negative psych ROS   GI/Hepatic negative GI ROS, Neg liver ROS,,,  Endo/Other    Class 4 obesity  Renal/GU negative Renal ROS     Musculoskeletal  (+) Arthritis ,    Abdominal  (+) + obese  Peds  Hematology negative hematology ROS (+)   Anesthesia Other Findings Plt 239  Reproductive/Obstetrics (+) Pregnancy                              Anesthesia Physical Anesthesia Plan  ASA: 3  Anesthesia Plan: Epidural   Post-op Pain Management:    Induction:   PONV Risk Score and Plan: 1 and Treatment may vary due to age or medical condition  Airway Management Planned: Natural Airway  Additional Equipment: None  Intra-op Plan:   Post-operative Plan:   Informed Consent: I have reviewed the patients History and Physical, chart, labs and discussed the procedure including the risks, benefits and alternatives for the proposed anesthesia with the patient or authorized representative who has indicated his/her understanding and acceptance.     Dental advisory given  Plan Discussed with: Anesthesiologist  Anesthesia Plan Comments:          Anesthesia Quick Evaluation

## 2024-04-04 NOTE — H&P (Signed)
 OBSTETRIC ADMISSION HISTORY AND PHYSICAL  Cassandra Harrison is 33 y.o. [id number] with IUP at [redacted]w[redacted]d 04/08/2024, by Ultrasound presenting for IOL. She received her prenatal care at [facility]   ROS (+) FM,  (-) ctx, VB, LOF. HA, visual changes, CP, SOB, RUQ pain, peripheral edema.   Prenatal History/Complications NURSING  PROVIDER  Office Location [facility] Dating by TBD  [facility] Model Traditional Anatomy U/S    Initiated care at  17wks                 Language  English               LAB RESULTS   Support Person  PARTNER Genetics NIPS: low risk female AFP:       NT/IT (FT only)        Carrier Screen Horizon:   Rhogam  B/Negative/-- (05/29 1413) A1C/GTT Early HgbA1C:  Third trimester 2 hr GTT: normal  Flu Vaccine No      TDaP Vaccine   Blood Type B/Negative/-- (05/29 1413)  RSV Vaccine   Antibody Negative (05/29 1413)  COVID Vaccine No Rubella 5.23 (05/29 1413)  Feeding Plan breast RPR Non Reactive (08/05 0825)  Contraception bilateral tubal ligation HBsAg Negative (05/29 1413)  Circumcision Yes if female HIV Non Reactive (08/05 0825)  Pediatrician  [facility]/ Landy Stains HCVAb Non Reactive (05/29 1413)  Prenatal Classes        BTL Consent  01/28/2024 Pap       Diagnosis  Date Value Ref Range Status  05/01/2023 (A)   Final    - Atypical squamous cells of undetermined significance (ASC-US )    BTL Pre-payment   GC/CT Initial:   36wks:    VBAC Consent   GBS For PCN allergy, check sensitivities   BRx Optimized? [ ]  yes   [X]  no      DME Rx [X]  BP cuff [ ]  Weight Scale Waterbirth  [ ]  Class [ ]  Consent [ ]  CNM visit  PHQ9 & GAD7 [X]  new OB [X]  28 weeks  [  ] 36 weeks Induction  [ ]  Orders Entered [ ] Foley Y/N   OB History  Gravida Para Term Preterm AB Living  3 2 2  0 0 2  SAB IAB Ectopic Multiple Live Births  0 0 0 0 2    # Outcome Date GA Lbr Len/2nd Weight Sex Type Anes PTL Lv  3 Current           2 Term 06/23/20 [redacted]w[redacted]d 29:03 / 00:23 3195 g F Vag-Spont EPI  LIV  1 Term 12/08/11 [redacted]w[redacted]d /  01:52 3495 g F Vag-Spont EPI  LIV   Patient Active Problem List   Diagnosis Date Noted   Encounter for induction of labor 04/04/2024   Obesity during pregnancy 01/14/2024   BMI 45.0-49.9, adult (HCC) 01/14/2024   Unwanted fertility 01/14/2024   Supervision of high risk pregnancy, antepartum 11/06/2023   Sprain of anterior talofibular ligament of left ankle 12/14/2021   Smoker within last 12 months 08/02/2020   Female stress incontinence 08/02/2020   Asthma    Morbid obesity (HCC)    Rh negative state in antepartum period 02/23/2020    Past Medical History: Past Medical History:  Diagnosis Date   Asthma    last used 11/29/11   Bulging disc    Chronic back pain    Complication of anesthesia    itching with epidural   Constipation    COVID-19 virus infection 05/2020  Mental disorder    Migraine    Morbid obesity (HCC)     Past Surgical History: Past Surgical History:  Procedure Laterality Date   WISDOM TOOTH EXTRACTION      Social History Social History   Socioeconomic History   Marital status: Single    Spouse name: Not on file   Number of children: Not on file   Years of education: Not on file   Highest education level: Not on file  Occupational History   Not on file  Tobacco Use   Smoking status: Every Day    Current packs/day: 0.25    Average packs/day: 0.3 packs/day for 0.6 years (0.1 ttl pk-yrs)    Types: Cigarettes    Start date: 09/2023   Smokeless tobacco: Never  Vaping Use   Vaping status: Never Used  Substance and Sexual Activity   Alcohol use: Not Currently    Comment: occ   Drug use: Not Currently    Types: Marijuana   Sexual activity: Yes    Birth control/protection: None  Other Topics Concern   Not on file  Social History Narrative   Not on file   Social Drivers of Health   Financial Resource Strain: Not on file  Food Insecurity: No Food Insecurity (04/04/2024)   Hunger Vital Sign    Worried About Running Out of Food in the Last  Year: Never true    Ran Out of Food in the Last Year: Never true  Transportation Needs: No Transportation Needs (04/04/2024)   PRAPARE - Administrator, Civil Service (Medical): No    Lack of Transportation (Non-Medical): No  Physical Activity: Not on file  Stress: Not on file  Social Connections: Unknown (10/14/2021)   Received from [facility]   Social Network    Social Network: Not on file    Family History: Family History  Problem Relation Age of Onset   Diabetes Mother    COPD Mother    Heart disease Mother    Cancer Mother        throat cancer   COPD Father    Heart disease Father    Cancer Sister    Cancer Paternal Aunt    Diabetes Maternal Grandmother    Cancer Maternal Grandmother    COPD Paternal Grandmother    Heart disease Paternal Grandfather    Other Neg Hx     Allergies: Allergies  Allergen Reactions   Olive Oil Nausea And Vomiting and Other (See Comments)    She can not have anything with olives in it   Latex Rash and Other (See Comments)    Patient states that she is allergic to latex condoms.  They give her a rash and a yeast infection.    Medications Prior to Admission  Medication Sig Dispense Refill Last Dose/Taking   albuterol  (VENTOLIN  HFA) 108 (90 Base) MCG/ACT inhaler Inhale 1-2 puffs into the lungs every 6 (six) hours as needed for wheezing or shortness of breath. 1 each 1    Blood Pressure Monitoring (BLOOD PRESSURE KIT) DEVI 1 Device by Does not apply route once a week. (Patient not taking: Reported on 03/24/2024) 1 each 0    pantoprazole  (PROTONIX ) 40 MG tablet Take 1 tablet (40 mg total) by mouth 2 (two) times daily before a meal. 60 tablet 5    Prenatal Vit-Fe Phos-FA-Omega (VITAFOL  GUMMIES) 3.33-0.333-34.8 MG CHEW Chew 3 tablets by mouth daily. 90 tablet 11      Review of Systems  All systems reviewed and negative except as stated in HPI  PHYSICAL EXAM Blood pressure 127/71, pulse 93, temperature 98.1 F (36.7 C),  temperature source Oral, resp. rate 20, height 5' 2.25 (1.581 m), weight 126.1 kg. General appearance: alert and cooperative Lungs: respirations nonlabored Heart: regular rate Abdomen: gravid  Fetal monitoringBaseline: 140 bpm, Variability: Good {> 6 bpm), Accelerations: Reactive, and Decelerations: Absent Uterine activityNone    Presentation: unsure   Prenatal labs: ABO, Rh: B/Negative/-- (05/29 1413) Antibody: Negative (05/29 1413) Rubella: 5.23 (05/29 1413) RPR: Non Reactive (08/05 0825)  HBsAg: Negative (05/29 1413)  HIV: Non Reactive (08/05 0825)   Lab Results  Component Value Date   GBS Negative 03/11/2024    Anatomy US  @25 +2: The anatomic structures that were well seen appear normal without evidence of soft markers. Due to poor acoustic windows some structures remain suboptimally visualized.  Immunization History  Administered Date(s) Administered   Influenza,inj,Quad PF,6+ Mos 03/22/2020   Pneumococcal Polysaccharide-23 12/10/2011   Rho (D) Immune Globulin  09/18/2011, 12/09/2011   Tdap 12/09/2011, 03/22/2020    Prenatal Transfer Tool  Maternal Diabetes: No Genetic Screening: Normal Maternal Ultrasounds/Referrals: Normal Fetal Ultrasounds or other Referrals:  None Maternal Substance Abuse:  No Significant Maternal Medications:  Meds include: Protonix  Significant Maternal Lab Results: Group B Strep negative and Rh negative Number of Prenatal Visits:greater than 3 verified prenatal visits Maternal Vaccinations: none Other Comments:  None   Results for orders placed or performed during the hospital encounter of 04/04/24 (from the past 24 hours)  CBC   Collection Time: 04/04/24  1:11 AM  Result Value Ref Range   WBC 7.8 4.0 - 10.5 K/uL   RBC 4.52 3.87 - 5.11 MIL/uL   Hemoglobin 11.9 (L) 12.0 - 15.0 g/dL   HCT 63.7 63.9 - 53.9 %   MCV 80.1 80.0 - 100.0 fL   MCH 26.3 26.0 - 34.0 pg   MCHC 32.9 30.0 - 36.0 g/dL   RDW 85.3 88.4 - 84.4 %   Platelets 239 150  - 400 K/uL   nRBC 0.0 0.0 - 0.2 %    Patient Active Problem List   Diagnosis Date Noted   Encounter for induction of labor 04/04/2024   Obesity during pregnancy 01/14/2024   BMI 45.0-49.9, adult (HCC) 01/14/2024   Unwanted fertility 01/14/2024   Supervision of high risk pregnancy, antepartum 11/06/2023   Sprain of anterior talofibular ligament of left ankle 12/14/2021   Smoker within last 12 months 08/02/2020   Female stress incontinence 08/02/2020   Asthma    Morbid obesity (HCC)    Rh negative state in antepartum period 02/23/2020    ASSESSMENT & PLAN TORINA EY is 33 y.o. [id number] with IUP at [redacted]w[redacted]d 04/08/2024, by Ultrasound admitted for IOL.  Sono at 38+5: normal anatomy, cephalic presentation, posterior placenta, EFW 3848g (86%), AC>99%  #Labor: IOL #Pain: Per patient preference, encourage ambulation #FWB: Cat I  #Rh negative S/p RhoGAM 8/5 - rhogam eval after delivery  #undesired fertility Medicaid consent signed 01/28/2024.  - desires BTL; PP vs OP  #asthma Mild intermittent Vs bronchospasm. Uses albuterol  only when sick.  #obesity BMI 50 at admission.  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

## 2024-04-04 NOTE — Progress Notes (Signed)
 LABOR PROGRESS NOTE Pt rechecked at 0545 Patient comfortable without epidural. Pit at none. SCE: 3/50/-2  FHT: baseline 140, mod variability, +accels, -decels; overall category I. Toco: rare  A/P:  Start pitocin  Assess AROM next check  [employer], DO 5:52 AM

## 2024-04-04 NOTE — Anesthesia Preprocedure Evaluation (Deleted)
 Anesthesia Evaluation  Patient identified by MRN, date of birth, ID band Patient awake    Reviewed: NPO status , Patient's Chart, lab work & pertinent test results  History of Anesthesia Complications Negative for: history of anesthetic complications  Airway Mallampati: III  TM Distance: >3 FB Neck ROM: Full    Dental  (+) Teeth Intact   Pulmonary asthma    Pulmonary exam normal        Cardiovascular negative cardio ROS  Rhythm:Regular Rate:Normal     Neuro/Psych  Headaches Hx of chronic back pain with bulging disc.   negative psych ROS   GI/Hepatic negative GI ROS, Neg liver ROS,,,  Endo/Other    Class 4 obesity  Renal/GU negative Renal ROS     Musculoskeletal  (+) Arthritis ,    Abdominal  (+) + obese  Peds  Hematology negative hematology ROS (+)   Anesthesia Other Findings Plt 239  Reproductive/Obstetrics (+) Pregnancy                              Anesthesia Physical Anesthesia Plan  ASA: 3  Anesthesia Plan: Epidural   Post-op Pain Management:    Induction:   PONV Risk Score and Plan: 1 and Treatment may vary due to age or medical condition  Airway Management Planned: Natural Airway  Additional Equipment: None  Intra-op Plan:   Post-operative Plan:   Informed Consent: I have reviewed the patients History and Physical, chart, labs and discussed the procedure including the risks, benefits and alternatives for the proposed anesthesia with the patient or authorized representative who has indicated his/her understanding and acceptance.     Dental advisory given  Plan Discussed with: Anesthesiologist  Anesthesia Plan Comments:          Anesthesia Quick Evaluation

## 2024-04-05 ENCOUNTER — Encounter (HOSPITAL_COMMUNITY): Payer: Self-pay | Admitting: Family Medicine

## 2024-04-05 ENCOUNTER — Encounter (HOSPITAL_COMMUNITY)
Admission: AD | Disposition: A | Discharge status: Home / Self Care | Payer: Self-pay | Source: Home / Self Care | Attending: Family Medicine

## 2024-04-05 DIAGNOSIS — Z302 Encounter for sterilization: Secondary | ICD-10-CM

## 2024-04-05 LAB — CBC
HCT: 30.6 % — ABNORMAL LOW (ref 36.0–46.0)
Hemoglobin: 10 g/dL — ABNORMAL LOW (ref 12.0–15.0)
MCH: 26.1 pg (ref 26.0–34.0)
MCHC: 32.7 g/dL (ref 30.0–36.0)
MCV: 79.9 fL — ABNORMAL LOW (ref 80.0–100.0)
Platelets: 193 K/uL (ref 150–400)
RBC: 3.83 MIL/uL — ABNORMAL LOW (ref 3.87–5.11)
RDW: 14.8 % (ref 11.5–15.5)
WBC: 20 K/uL — ABNORMAL HIGH (ref 4.0–10.5)
nRBC: 0 % (ref 0.0–0.2)

## 2024-04-05 SURGERY — LIGATION, FALLOPIAN TUBE, POSTPARTUM
Anesthesia: Epidural | Site: Abdomen | Laterality: Bilateral

## 2024-04-05 MED ORDER — RHO D IMMUNE GLOBULIN 1500 UNIT/2ML IJ SOSY
300.0000 ug | PREFILLED_SYRINGE | Freq: Once | INTRAMUSCULAR | Status: DC
  Filled 2024-04-05: qty 2

## 2024-04-05 MED ORDER — ONDANSETRON HCL 4 MG/2ML IJ SOLN: 4.0000 mg | INTRAMUSCULAR | Status: DC | PRN

## 2024-04-05 MED ORDER — ACETAMINOPHEN 325 MG PO TABS: 650.0000 mg | ORAL_TABLET | ORAL | Status: DC | PRN

## 2024-04-05 MED ORDER — FENTANYL CITRATE (PF) 100 MCG/2ML IJ SOLN
INTRAMUSCULAR | Status: AC
  Filled 2024-04-05: qty 2

## 2024-04-05 MED ORDER — FENTANYL CITRATE (PF) 100 MCG/2ML IJ SOLN: 25.0000 ug | INTRAMUSCULAR | Status: DC | PRN

## 2024-04-05 MED ORDER — MIDAZOLAM HCL (PF) 2 MG/2ML IJ SOLN
INTRAMUSCULAR | Status: DC | PRN
  Administered 2024-04-05: 1 mg via INTRAVENOUS
  Administered 2024-04-05: 2 mg via INTRAVENOUS
  Administered 2024-04-05: 1 mg via INTRAVENOUS

## 2024-04-05 MED ORDER — FENTANYL CITRATE (PF) 100 MCG/2ML IJ SOLN
INTRAMUSCULAR | Status: DC | PRN
  Administered 2024-04-05: 85 ug via INTRAVENOUS

## 2024-04-05 MED ORDER — FAMOTIDINE 20 MG PO TABS
40.0000 mg | ORAL_TABLET | Freq: Once | ORAL | Status: AC
Start: 2024-04-05 — End: 2024-04-05
  Administered 2024-04-05: 40 mg via ORAL
  Filled 2024-04-05: qty 2

## 2024-04-05 MED ORDER — LACTATED RINGERS IV SOLN: INTRAVENOUS | Status: AC

## 2024-04-05 MED ORDER — BENZOCAINE-MENTHOL 20-0.5 % EX AERO
1.0000 | INHALATION_SPRAY | CUTANEOUS | Status: DC | PRN
  Administered 2024-04-06: 1 via TOPICAL
  Filled 2024-04-05: qty 56

## 2024-04-05 MED ORDER — DIPHENHYDRAMINE HCL 25 MG PO CAPS: 25.0000 mg | ORAL_CAPSULE | Freq: Four times a day (QID) | ORAL | Status: DC | PRN

## 2024-04-05 MED ORDER — ZOLPIDEM TARTRATE 5 MG PO TABS: 5.0000 mg | ORAL_TABLET | Freq: Every evening | ORAL | Status: DC | PRN

## 2024-04-05 MED ORDER — PRENATAL MULTIVITAMIN CH
1.0000 | ORAL_TABLET | Freq: Every day | ORAL | Status: DC
  Administered 2024-04-05: 1 via ORAL
  Filled 2024-04-05: qty 1

## 2024-04-05 MED ORDER — BUPIVACAINE IN DEXTROSE 0.75-8.25 % IT SOLN
INTRATHECAL | Status: DC | PRN
  Administered 2024-04-05: 1.4 mL via INTRATHECAL

## 2024-04-05 MED ORDER — IBUPROFEN 600 MG PO TABS
600.0000 mg | ORAL_TABLET | Freq: Four times a day (QID) | ORAL | Status: DC
  Administered 2024-04-05 – 2024-04-06 (×6): 600 mg via ORAL
  Filled 2024-04-05 (×6): qty 1

## 2024-04-05 MED ORDER — FENTANYL CITRATE (PF) 100 MCG/2ML IJ SOLN
INTRAMUSCULAR | Status: DC | PRN
  Administered 2024-04-05: 15 ug via INTRATHECAL

## 2024-04-05 MED ORDER — WITCH HAZEL-GLYCERIN EX PADS: 1.0000 | MEDICATED_PAD | CUTANEOUS | Status: DC | PRN

## 2024-04-05 MED ORDER — TETANUS-DIPHTH-ACELL PERTUSSIS 5-2-15.5 LF-MCG/0.5 IM SUSP
0.5000 mL | Freq: Once | INTRAMUSCULAR | Status: AC
  Administered 2024-04-06: 0.5 mL via INTRAMUSCULAR
  Filled 2024-04-05: qty 0.5

## 2024-04-05 MED ORDER — ONDANSETRON HCL 4 MG PO TABS: 4.0000 mg | ORAL_TABLET | ORAL | Status: DC | PRN

## 2024-04-05 MED ORDER — DEXMEDETOMIDINE HCL IN NACL 80 MCG/20ML IV SOLN
INTRAVENOUS | Status: DC | PRN
  Administered 2024-04-05: 12 ug via INTRAVENOUS

## 2024-04-05 MED ORDER — BUPIVACAINE HCL (PF) 0.25 % IJ SOLN
INTRAMUSCULAR | Status: DC | PRN
  Administered 2024-04-05: 10 mL

## 2024-04-05 MED ORDER — METOCLOPRAMIDE HCL 10 MG PO TABS
10.0000 mg | ORAL_TABLET | Freq: Once | ORAL | Status: AC
  Administered 2024-04-05: 10 mg via ORAL
  Filled 2024-04-05: qty 1

## 2024-04-05 MED ORDER — DIPHENHYDRAMINE HCL 50 MG/ML IJ SOLN
INTRAMUSCULAR | Status: AC
Start: 2024-04-05 — End: 2024-04-05
  Filled 2024-04-05: qty 1

## 2024-04-05 MED ORDER — BUPIVACAINE HCL (PF) 0.25 % IJ SOLN
INTRAMUSCULAR | Status: AC
  Filled 2024-04-05: qty 30

## 2024-04-05 MED ORDER — SIMETHICONE 80 MG PO CHEW: 80.0000 mg | CHEWABLE_TABLET | ORAL | Status: DC | PRN

## 2024-04-05 MED ORDER — OXYCODONE HCL 5 MG PO TABS: 5.0000 mg | ORAL_TABLET | Freq: Four times a day (QID) | ORAL | Status: DC | PRN

## 2024-04-05 MED ORDER — STERILE WATER FOR IRRIGATION IR SOLN
Status: DC | PRN
  Administered 2024-04-05: 1

## 2024-04-05 MED ORDER — AMISULPRIDE (ANTIEMETIC) 5 MG/2ML IV SOLN: 10.0000 mg | Freq: Once | INTRAVENOUS | Status: DC | PRN

## 2024-04-05 MED ORDER — DIBUCAINE (PERIANAL) 1 % EX OINT: 1.0000 | TOPICAL_OINTMENT | CUTANEOUS | Status: DC | PRN

## 2024-04-05 MED ORDER — MIDAZOLAM HCL 2 MG/2ML IJ SOLN
INTRAMUSCULAR | Status: AC
  Filled 2024-04-05: qty 2

## 2024-04-05 MED ORDER — COCONUT OIL OIL
1.0000 | TOPICAL_OIL | Status: DC | PRN
  Administered 2024-04-05: 1 via TOPICAL

## 2024-04-05 MED ORDER — ONDANSETRON HCL 4 MG/2ML IJ SOLN
INTRAMUSCULAR | Status: DC | PRN
  Administered 2024-04-05: 4 mg via INTRAVENOUS

## 2024-04-05 MED ORDER — DIPHENHYDRAMINE HCL 50 MG/ML IJ SOLN
INTRAMUSCULAR | Status: DC | PRN
  Administered 2024-04-05: 25 mg via INTRAVENOUS

## 2024-04-05 MED ORDER — SENNOSIDES-DOCUSATE SODIUM 8.6-50 MG PO TABS
2.0000 | ORAL_TABLET | Freq: Every day | ORAL | Status: DC
  Administered 2024-04-05 – 2024-04-06 (×2): 2 via ORAL
  Filled 2024-04-05 (×2): qty 2

## 2024-04-05 SURGICAL SUPPLY — 27 items
CLOTH BEACON ORANGE TIMEOUT ST (SAFETY) ×1 IMPLANT
DERMABOND ADVANCED .7 DNX12 (GAUZE/BANDAGES/DRESSINGS) ×1 IMPLANT
DRSG OPSITE POSTOP 3X4 (GAUZE/BANDAGES/DRESSINGS) ×1 IMPLANT
ELECTRODE REM PT RTRN 9FT ADLT (ELECTROSURGICAL) ×1 IMPLANT
GLOVE BIOGEL PI IND STRL 6.5 (GLOVE) ×1 IMPLANT
GLOVE BIOGEL PI IND STRL 7.0 (GLOVE) ×1 IMPLANT
GLOVE ECLIPSE 6.5 STRL STRAW (GLOVE) ×1 IMPLANT
GOWN STRL REUS W/TWL LRG LVL3 (GOWN DISPOSABLE) ×2 IMPLANT
NEEDLE HYPO 22GX1.5 SAFETY (NEEDLE) ×1 IMPLANT
NS IRRIG 1000ML POUR BTL (IV SOLUTION) ×1 IMPLANT
PACK ABDOMINAL MINOR (CUSTOM PROCEDURE TRAY) ×1 IMPLANT
PENCIL BUTTON HOLSTER BLD 10FT (ELECTRODE) ×1 IMPLANT
PENCIL SMOKE EVAC W/HOLSTER (ELECTROSURGICAL) IMPLANT
PROTECTOR NERVE ULNAR (MISCELLANEOUS) ×1 IMPLANT
SPONGE LAP 4X18 RFD (DISPOSABLE) IMPLANT
SPONGE T-LAP 18X18 ~~LOC~~+RFID (SPONGE) IMPLANT
SPONGE T-LAP 4X18 ~~LOC~~+RFID (SPONGE) IMPLANT
SUT PLAIN 0 NONE (SUTURE) ×1 IMPLANT
SUT VIC AB 0 CT1 27XBRD ANBCTR (SUTURE) ×1 IMPLANT
SUT VIC AB 2-0 SH 27XBRD (SUTURE) IMPLANT
SUT VICRYL 4-0 PS2 18IN ABS (SUTURE) ×1 IMPLANT
SYR CONTROL 10ML LL (SYRINGE) ×1 IMPLANT
TOWEL OR 17X24 6PK STRL BLUE (TOWEL DISPOSABLE) ×2 IMPLANT
TRAY FOLEY CATH SILVER 14FR (SET/KITS/TRAYS/PACK) ×1 IMPLANT
TUBING NON-CON 1/4 X 20 CONN (TUBING) ×1 IMPLANT
WATER STERILE IRR 1000ML POUR (IV SOLUTION) ×1 IMPLANT
YANKAUER SUCT BULB TIP NO VENT (SUCTIONS) ×1 IMPLANT

## 2024-04-05 NOTE — Lactation Note (Signed)
 This note was copied from a baby's chart. Lactation Consultation Note  Patient Name: Boy Makita Blow Unijb'd Date: 04/05/2024 Age:33 hours, P3  Reason for consult: Initial assessment;Term Per mom the baby was last fed at 6:26 - 7 ml of formula.  LC reviewed and updated the doc flow sheets per mom , attempted x 1 at the breast, and has had 2 stools, HNV.  LC reviewed breastfeeding goals for 24 hours - feed with cues and by 3 hours offer the breast.   Maternal Data Does the patient have breastfeeding experience prior to this delivery?: Yes How long did the patient breastfeed?: per mom didn't breast feed her 1st baby and her 2nd baby 4 months.  Feeding Mother's Current Feeding Choice: Breast Milk and Formula Nipple Type: Slow - flow  LATCH Score - none as yet     Lactation Tools Discussed/Used Tools: Pump;Other (comment) (was set up by the nurse - per mom due the baby not latching) Breast pump type: Double-Electric Breast Pump Pump Education: Setup, frequency, and cleaning;Milk Storage  Interventions  Breast feeding basics, Education, and   Discharge Pump: Personal;DEBP (per mom not sure of the name)  Consult Status Consult Status: Follow-up Date: 04/05/24 Follow-up type: In-patient    Rollene Caldron Jovahn Breit 04/05/2024, 8:44 AM

## 2024-04-05 NOTE — Anesthesia Postprocedure Evaluation (Signed)
 Anesthesia Post Note  Patient: Cassandra Harrison  Procedure(s) Performed: LIGATION, FALLOPIAN TUBE, POSTPARTUM (Bilateral: Abdomen)     Patient location during evaluation: PACU Anesthesia Type: Spinal Level of consciousness: awake and alert Pain management: pain level controlled Vital Signs Assessment: post-procedure vital signs reviewed and stable Respiratory status: spontaneous breathing and respiratory function stable Cardiovascular status: blood pressure returned to baseline and stable Postop Assessment: spinal receding Anesthetic complications: no   No notable events documented.  Last Vitals:  Vitals:   04/05/24 2055 04/05/24 2105  BP:  107/84  Pulse: 74   Resp: (!) 24 18  Temp:    SpO2: 99%     Last Pain:  Vitals:   04/05/24 2105  TempSrc:   PainSc: 0-No pain   Pain Goal:    LLE Motor Response: Purposeful movement (04/05/24 2100) LLE Sensation: Tingling (04/05/24 2100) RLE Motor Response: Purposeful movement (04/05/24 2100) RLE Sensation: Tingling (04/05/24 2100)     Epidural/Spinal Function Cutaneous sensation: Tingles (04/05/24 2100), Patient able to flex knees: Yes (04/05/24 2100), Patient able to lift hips off bed: No (04/05/24 2100), Back pain beyond tenderness at insertion site: No (04/05/24 2100), Progressively worsening motor and/or sensory loss: No (04/05/24 2100), Bowel and/or bladder incontinence post epidural: No (04/05/24 2100)  Epifanio Lamar BRAVO

## 2024-04-05 NOTE — Discharge Summary (Signed)
 Postpartum Discharge Summary  Date of Service updated***     Patient Name: Cassandra Harrison DOB: 28-Jun-1990 MRN: [id number]  Date of admission: 04/04/2024 Delivery date:04/04/2024 Delivering provider: MILLY PLANAS A Date of discharge: 04/05/2024  Admitting diagnosis: Encounter for induction of labor [Z34.90] Intrauterine pregnancy: [redacted]w[redacted]d     Secondary diagnosis:  Principal Problem:   Encounter for induction of labor Active Problems:   Rh negative state in antepartum period   Asthma   Morbid obesity (HCC)   Tobacco use during pregnancy   Unwanted fertility  Additional problems: ***    Discharge diagnosis: {DX.:23714}                                              Post partum procedures:{Postpartum procedures:23558} Augmentation: {Augmentation:20782} Complications: {OB Labor/Delivery Complications:20784}  Hospital course: {Courses:23701}  Magnesium  Sulfate received: {Mag received:30440022} BMZ received: {BMZ received:30440023} Rhophylac :{Rhophylac  received:30440032} MMR:{MMR:30440033} T-DaP:{Tdap:23962} Flu: {Qol:76036} RSV Vaccine received: {RSV:31013} Transfusion:{Transfusion received:30440034}  Immunizations received: Immunization History  Administered Date(s) Administered   Influenza,inj,Quad PF,6+ Mos 03/22/2020   Pneumococcal Polysaccharide-23 12/10/2011   Rho (D) Immune Globulin  09/18/2011, 12/09/2011   Tdap 12/09/2011, 03/22/2020    Physical exam  Vitals:   04/05/24 0023 04/05/24 0100 04/05/24 0200 04/05/24 0555  BP: (!) 105/54 (!) 103/42 (!) 101/50 (!) 110/56  Pulse: 92 84 76 72  Resp:  20 18 18   Temp:  99 F (37.2 C) 99.3 F (37.4 C) 98.9 F (37.2 C)  TempSrc:  Oral Oral Oral  SpO2:  98%    Weight:      Height:       General: {Exam; general:21111117} Lochia: {Desc; appropriate/inappropriate:30686::appropriate} Uterine Fundus: {Desc; firm/soft:30687} Incision: {Exam; incision:21111123} DVT Evaluation: {Exam;  dvt:2111122} Labs: Lab Results  Component Value Date   WBC 20.0 (H) 04/05/2024   HGB 10.0 (L) 04/05/2024   HCT 30.6 (L) 04/05/2024   MCV 79.9 (L) 04/05/2024   PLT 193 04/05/2024      Latest Ref Rng & Units 06/24/2023    8:32 PM  CMP  Glucose 70 - 99 mg/dL 896   BUN 6 - 20 mg/dL 9   Creatinine 9.55 - 8.99 mg/dL 9.24   Sodium 864 - 854 mmol/L 139   Potassium 3.5 - 5.1 mmol/L 4.6   Chloride 98 - 111 mmol/L 106   CO2 22 - 32 mmol/L 25   Calcium 8.9 - 10.3 mg/dL 9.7    Edinburgh Score:    08/02/2020    9:33 AM  Edinburgh Postnatal Depression Scale Screening Tool  I have been able to laugh and see the funny side of things. 0   I have looked forward with enjoyment to things. 0   I have blamed myself unnecessarily when things went wrong. 0   I have been anxious or worried for no good reason. 0   I have felt scared or panicky for no good reason. 0   Things have been getting on top of me. 1   I have been so unhappy that I have had difficulty sleeping. 0   I have felt sad or miserable. 0   I have been so unhappy that I have been crying. 1   The thought of harming myself has occurred to me. 0   Edinburgh Postnatal Depression Scale Total 2      Data saved with a previous flowsheet row definition  No data recorded  After visit meds:  Allergies as of 04/05/2024       Reactions   Olive Oil Nausea And Vomiting, Other (See Comments)   She can not have anything with olives in it   Latex Rash, Other (See Comments)   Patient states that she is allergic to latex condoms.  They give her a rash and a yeast infection.     Med Rec must be completed prior to using this [facility]***        Discharge home in stable condition Infant Feeding: {Baby feeding:23562} Infant Disposition:{CHL IP OB HOME WITH FNUYZM:76418} Discharge instruction: per After Visit Summary and Postpartum booklet. Activity: Advance as tolerated. Pelvic rest for 6 weeks.  Diet: {OB ipzu:78888878} Future  Appointments: Future Appointments  Date Time Provider Department Center  04/06/2024  1:15 PM [facility]  04/07/2024  3:30 PM Jomarie Charlie LABOR, MD [facility] None   Follow up Visit:   Please schedule this patient for a {Visit type:23955} postpartum visit in {Postpartum visit:23953} with the following provider: {Provider type:23954}. Additional Postpartum F/U:{PP Procedure:23957}  {Risk level:23960} pregnancy complicated by: {complication:23959} Delivery mode:  Vaginal, Spontaneous Anticipated Birth Control:  {Birth Control:23956}   04/05/2024 Olam Boards, CNM

## 2024-04-05 NOTE — Progress Notes (Signed)
 Patient NPO since 0830 this morning. Called around 1300, per CNM, keep patient NPO for a couple more hours pending a scheduled BTL. At 1615 - no scheduled BTL at this time, but per Dr. Ozan, will come speak to patient after procedure downstairs. At 1630 patient will be NPO for 8 hours. Explained to patient.

## 2024-04-05 NOTE — Anesthesia Procedure Notes (Addendum)
 Spinal  Patient location during procedure: OR Start time: 04/05/2024 6:34 PM End time: 04/05/2024 6:39 PM Reason for block: surgical anesthesia Staffing Performed: anesthesiologist  Anesthesiologist: Epifanio Charleston, MD Performed by: Epifanio Charleston, MD Authorized by: Epifanio Charleston, MD   Preanesthetic Checklist Completed: patient identified, IV checked, site marked, risks and benefits discussed, surgical consent, monitors and equipment checked, pre-op evaluation and timeout performed Spinal Block Patient position: sitting Prep: DuraPrep Patient monitoring: heart rate, cardiac monitor, continuous pulse ox and blood pressure Approach: midline Location: L4-5 Injection technique: single-shot Needle Needle type: Pencan  Needle gauge: 24 G Needle length: 9 cm Assessment Sensory level: T4 Events: CSF return

## 2024-04-05 NOTE — Lactation Note (Signed)
 This note was copied from a baby's chart. Lactation Consultation Note  Patient Name: Cassandra Harrison Date: 04/05/2024 Age:33 hours, P3 , Breast fed her 2nd baby with a NS  Reason for consult: Follow-up assessment The [facility] nurse assisted mom to latch with a #20 NS and per mom baby fed 16 mins with colostrum on the NS afterwards and supplemented baby with 7 ml of formula.  LC came in and reviewed the DEBP and checked the flanged- #18 for both per mom comfortable . Coconut oil used and it was working well with the the inverted nipple right breast and the semi flat nipple left.  LC plan:  Breast shells between feedings except when sleeping.  Feed with cues and by 3 hours STS with the use of the #20 NS.  Feed for 15 -20 mins and supplement with EBM or formula starting at 20 ml and increasing to 30 ml by 48 hours.  After the baby is settled , post pump both breast with the DEBP , save milk for the next feeding.    Maternal Data Does the patient have breastfeeding experience prior to this delivery?: Yes How long did the patient breastfeed?: per mom didn't breast feed her 1st baby and her 2nd baby 4 months.  Feeding Mother's Current Feeding Choice: Breast Milk and Formula Nipple Type: Slow - flow  LATCH Score Latch: Grasps breast easily, tongue down, lips flanged, rhythmical sucking.  Audible Swallowing: A few with stimulation  Type of Nipple: Flat  Comfort (Breast/Nipple): Soft / non-tender  Hold (Positioning): Assistance needed to correctly position infant at breast and maintain latch.  LATCH Score: 7   Lactation Tools Discussed/Used Tools: Shells;Pump;Flanges;Coconut oil;Nipple Shields Nipple shield size: 20 (the [facility] nurse started the Nipple Shield) Flange Size: 18;21 Breast pump type: Double-Electric Breast Pump Pump Education: Setup, frequency, and cleaning;Milk Storage Reason for Pumping: DL , NS  Interventions Interventions: Breast feeding basics  reviewed;Coconut oil;Shells;Hand pump;DEBP;Education;Pace feeding;LC Services brochure;CDC milk storage guidelines;CDC Guidelines for Breast Pump Cleaning  Discharge Pump: DEBP;Personal  Consult Status Consult Status: Follow-up Date: 04/06/24 Follow-up type: In-patient    Cassandra Harrison 04/05/2024, 12:25 PM

## 2024-04-05 NOTE — Op Note (Signed)
 Operative Note   04/05/2024  PRE-OP DIAGNOSIS: Desire for permanent sterilization.  Postpartum Day #1   POST-OP DIAGNOSIS: Same  SURGEON: Surgeon(s) and Role: Dr. Delon Prude  ASSISTANT:  Dr. Barkley Angles  ANESTHESIA: Epidural and local  PROCEDURE: mini-laparotomy, bilateral tubal ligation via salpingectomy  ESTIMATED BLOOD LOSS: 5mL   TOTAL IV FLUIDS: crystalloid  SPECIMENS:  Portions of bilateral tubes to pathology  VTE PROPHYLAXIS: SCDs to the bilateral lower extremities  ANTIBIOTICS: not indicated  COMPLICATIONS: none  DISPOSITION: PACU - hemodynamically stable.  CONDITION: stable  FINDINGS: No intra-abdominal adhesions noted. Smooth, normally contoured uterine fundus and bilateral tubes. Normal appearing tubes and normal ovaries on palpation.   PROCEDURE IN DETAIL: The patient was taken to the OR where anesthesia was administed. The patient was positioned in dorsal supine. The patient was prepped and draped in the normal sterile fashion.  A 4cm horizontal skin incision was made immediately below the umbilicus and the underlying tissue dissected with the scalpel.  The umbilical fascial plate was grasped with Allis clamps and entered sharply with the scissors.  The peritoneum was bluntly entered and a moist lap sponge used to displace the bowel.  The left fallopian tube was then visualized and grasped with a Babcock clamp.  Using the Babcock clamp the tube was followed all the way down to the fimbriated edge.  Kelly clamps were then placed across the lower third of the tube.  This portion of the tube was then ligated and sutured using a tie of plain gut.  An additional suture of 2-0 vicryl was placed and excellent hemostasis was confirmed. Attention was turned to the right side and in a similar fashion the lower portion of the right fallopian tube was clamped, cut and suture ligated.  Excellent hemostasis was confirmed.  The lap sponge was then removed from the  abdomen and the fascia closed in running fashion with 0 vicryl. The skin was then closed with 4-0 monocryl and dermabond.  The fascia and skin were injected with 0.25% percent marcaine .  The patient tolerated the procedure well. All counts were correct x 2. The patient was transferred to the recovery room awake, alert and breathing independently.

## 2024-04-05 NOTE — Progress Notes (Signed)
  Patient desires permanent sterilization.  Other reversible forms of contraception were discussed with patient; she declines all other modalities. Risks of procedure discussed with patient including but not limited to: risk of regret, permanence of method, bleeding, infection, and potential injury to surrounding organs.   Patient verbalized understanding of these risks and wants to proceed with sterilization.  Written informed consent obtained.  Will speak with OR to schedule hopefully for this evening.  Pt last ate breakfast around 8am.  Mekhi Sonn, DO Attending Obstetrician & Gynecologist, [facility] for [employer], [facility]

## 2024-04-05 NOTE — Transfer of Care (Signed)
 Immediate Anesthesia Transfer of Care Note  Patient: Cassandra Harrison  Procedure(s) Performed: LIGATION, FALLOPIAN TUBE, POSTPARTUM (Bilateral: Abdomen)  Patient Location: PACU  Anesthesia Type:Spinal  Level of Consciousness: awake, alert , and oriented  Airway & Oxygen Therapy: Patient Spontanous Breathing  Post-op Assessment: Report given to RN and Post -op Vital signs reviewed and stable  Post vital signs: Reviewed and stable  Last Vitals:  Vitals Value Taken Time  BP 107/56 04/05/24 20:00  Temp 36.5 C 04/05/24 19:59  Pulse 59 04/05/24 20:03  Resp 17 04/05/24 20:03  SpO2 98 % 04/05/24 20:03  Vitals shown include unfiled device data.  Last Pain:  Vitals:   04/05/24 2001  TempSrc:   PainSc: 1          Complications: No notable events documented.

## 2024-04-06 ENCOUNTER — Encounter (HOSPITAL_COMMUNITY): Payer: Self-pay | Admitting: Obstetrics & Gynecology

## 2024-04-06 ENCOUNTER — Ambulatory Visit: Payer: MEDICAID

## 2024-04-06 ENCOUNTER — Other Ambulatory Visit (HOSPITAL_COMMUNITY): Payer: Self-pay

## 2024-04-06 MED ORDER — BENZOCAINE-MENTHOL 20-0.5 % EX AERO: 1.0000 | INHALATION_SPRAY | CUTANEOUS | Status: DC | PRN

## 2024-04-06 MED ORDER — WITCH HAZEL-GLYCERIN EX PADS
1.0000 | MEDICATED_PAD | CUTANEOUS | 12 refills | Status: DC | PRN
  Filled 2024-04-06: qty 40, 40d supply, fill #0

## 2024-04-06 MED ORDER — SENNOSIDES-DOCUSATE SODIUM 8.6-50 MG PO TABS
2.0000 | ORAL_TABLET | Freq: Every day | ORAL | 0 refills | Status: DC
  Filled 2024-04-06: qty 30, 15d supply, fill #0

## 2024-04-06 MED ORDER — ACETAMINOPHEN 500 MG PO TABS
1000.0000 mg | ORAL_TABLET | Freq: Four times a day (QID) | ORAL | 0 refills | Status: AC | PRN
  Filled 2024-04-06: qty 30, 4d supply, fill #0

## 2024-04-06 MED ORDER — INFLUENZA VIRUS VACC SPLIT PF (FLUZONE) 0.5 ML IM SUSY
0.5000 mL | PREFILLED_SYRINGE | Freq: Once | INTRAMUSCULAR | Status: AC
  Administered 2024-04-06: 0.5 mL via INTRAMUSCULAR
  Filled 2024-04-06: qty 0.5

## 2024-04-06 MED ORDER — IBUPROFEN 600 MG PO TABS
600.0000 mg | ORAL_TABLET | Freq: Four times a day (QID) | ORAL | 0 refills | Status: DC
  Filled 2024-04-06: qty 30, 8d supply, fill #0

## 2024-04-06 MED ORDER — INFLUENZA VIRUS VACC SPLIT PF (FLUZONE) 0.5 ML IM SUSY: 0.5000 mL | PREFILLED_SYRINGE | INTRAMUSCULAR | Status: DC

## 2024-04-06 MED ORDER — COCONUT OIL OIL: 1.0000 | TOPICAL_OIL | Status: AC | PRN

## 2024-04-06 MED ORDER — RHO D IMMUNE GLOBULIN 1500 UNIT/2ML IJ SOSY
300.0000 ug | PREFILLED_SYRINGE | Freq: Once | INTRAMUSCULAR | Status: AC
  Administered 2024-04-06: 300 ug via INTRAMUSCULAR
  Filled 2024-04-06: qty 2

## 2024-04-06 MED ORDER — DIBUCAINE (PERIANAL) 1 % EX OINT: 1.0000 | TOPICAL_OINTMENT | CUTANEOUS | Status: DC | PRN

## 2024-04-06 MED ORDER — ACETAMINOPHEN 10 MG/ML IV SOLN
INTRAVENOUS | Status: DC | PRN
  Administered 2024-04-05: 1000 mg via INTRAVENOUS

## 2024-04-06 NOTE — Patient Instructions (Signed)

## 2024-04-06 NOTE — Addendum Note (Signed)
 Addendum  created 04/06/24 0046 by Edelmiro Elenor NOVAK, CRNA   Intraprocedure Meds edited

## 2024-04-06 NOTE — Clinical Social Work Maternal (Signed)
 CLINICAL SOCIAL WORK MATERNAL/CHILD NOTE  Patient Details  Name: Cassandra Harrison MRN: [id number] Date of Birth: August 02, 1990  Date:  04/06/2024  Clinical Social Worker Initiating Note:  Rosina Molt Date/Time: Initiated:  04/06/24/1526     Child's Name:  Cassandra Harrison   Biological Parents:  Mother Cassandra Harrison 09/13/1990 she did not want to name his father)   Need for Interpreter:  None   Reason for Referral:  Behavioral Health Concerns, Current Substance Use/Substance Use During Pregnancy     Address:  [address] [place] [address]    Phone number:  [phone] (home)     Additional phone number:   Household Members/Support Persons (HM/SP):   Household Member/Support Person 1, Household Member/Support Person 2, Household Member/Support Person 3   HM/SP Name Relationship DOB or Age  HM/SP -1   MOB's girls father    HM/SP -2 Cassandra Harrison Dauhgter 12-08-2011  HM/SP -3 Cassandra Harrison Daughter 06-23-2020  HM/SP -4        HM/SP -5        HM/SP -6        HM/SP -7        HM/SP -8          Natural Supports (not living in the home):  Spouse/significant other, Children   Professional Supports: None   Employment: Unemployed   Type of Work:     Education:  9 to 11 years   Homebound arranged:    Surveyor, Quantity Resources:  Medicaid   Other Resources:  [facility]   Cultural/Religious Considerations Which May Impact Care:    Strengths:  Ability to meet basic needs  , Understanding of illness, Home prepared for child  , Pediatrician chosen   Psychotropic Medications:         Pediatrician:    [employer] area  Pediatrician List:   [employer] [place]    [place]    [place] [facility]      Pediatrician Fax Number:    Risk Factors/Current Problems:  Substance Use  , Mental Health Concerns     Cognitive State:  Alert  , Able to Concentrate  , Linear Thinking  , Insightful  , Goal Oriented     Mood/Affect:  Calm  ,  Comfortable  , Interested  , Relaxed     CSW Assessment:  CSW received a consult due to hx of substance use and hx of Bipolar. CSW met MOB at bedside to complete a full psychosocial assessment and offer support. CSW entered the room, introduced herself and explained the reason for this visit. MOB presented as calm, was agreeable to consult and remained engaged throughout encounter.  CSW collected MOB's demographic information and she reported CPS hx in 2014 with Cassandra Harrison [birth year]08[birth year]2011. MOB reported their dad was involved in a case originally with his ex girlfriend in with [place] and MOB was looped into the case. MOB reported the case was not open for long and closed a few weeks later. CSW inquired about MOB's mental health history. MOB reported being diagnosed with Bipolar in January 2025 during her [facility] admission. MOB reported having a argument with her girls dad over a cellphone and said during the argument I wish I was deadand that's what led to the admission. MOB reported staying the full week and being prescribed medication for support. 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 MOB reported she has discontinued the medication prescribed due to feeling like a zombie and not feeling like herself. MOB reported coping skills which included understanding her triggers and once she feels overwhelmed she notifies her family and is able to walk away. CSW provided education regarding the baby blues period vs. perinatal mood disorders, discussed treatment and gave resources for mental health follow up if concerns arise.  CSW recommends self-evaluation during the postpartum time period using the New Mom Checklist from Postpartum Progress and encouraged MOB to contact a medical professional if symptoms are noted at any time.    CSW asked MOB does she receive support resources; MOB said No(WIC)  MOB reported having all essential  items for the infant including a carseat, bassinet and crib for safe sleeping.  CSW informed MOB due to hx of THC during her pregnancy and positive screening for methamphetamines in January; the hospital will perform a UDS and CDS on the infant. If the screenings return with positive results a report to CPS will be made; MOB was understanding. 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 MOB reported only using THC throughtout her pregnancy and prior.   CSW identifies no further need for intervention and no barriers to discharge at this time.  The infant's UDS was negative for all substances. CSW will continue to monitor the CDS and complete CPS report if warranted.  CSW Plan/Description:   No Further Intervention Required/No Barriers to Discharge, Sudden Infant Death Syndrome (SIDS) Education, Perinatal Mood and Anxiety Disorder (PMADs) Education, Hospital Drug Screen Policy Information, Other Information/Referral to [facility], CSW Will Continue to Monitor Umbilical Cord Tissue Drug Screen Results and Make Report if Cassandra Rosina MARLA Joshua, LCSW 04/06/2024, 3:29 PM

## 2024-04-06 NOTE — Lactation Note (Signed)
 This note was copied from a baby's chart. Lactation Consultation Note  Patient Name: Cassandra Harrison Date: 04/06/2024 Age:33 hours Reason for consult: Follow-up assessment;Term  P3- MOB reports that she has been in too much pain to latch infant after her surgery. MOB plans to offer the breast again once discharged (if she is feeling up to it). MOB does report that she has been pumping and that feels better than latching at this time. LC praised MOB for continuing the breast stimulation by pumping. LC encouraged MOB to pump until she is ready to start latching infant again. LC reviewed the outpatient LC services with MOB and encouraged her to call for assistance if she finds trouble with latching at home. MOB denies having any questions or concerns at this time. LC reviewed engorgement/breast care and encouraged her to call for further assistance as needed.  Maternal Data Has patient been taught Hand Expression?: Yes Does the patient have breastfeeding experience prior to this delivery?: Yes  Feeding Mother's Current Feeding Choice: Breast Milk and Formula Nipple Type: Slow - flow  Lactation Tools Discussed/Used Tools: Pump;Flanges Breast pump type: Double-Electric Breast Pump;Manual Pump Education: Setup, frequency, and cleaning;Milk Storage  Interventions Interventions: Breast feeding basics reviewed;Hand pump;DEBP;Education;LC Services brochure  Discharge Discharge Education: Engorgement and breast care;Warning signs for feeding baby Pump: DEBP;Personal  Consult Status Consult Status: Complete Date: 04/06/24    Recardo Hoit BS, IBCLC 04/06/2024, 9:30 AM

## 2024-04-07 LAB — RH IG WORKUP (INCLUDES ABO/RH)
Fetal Screen: NEGATIVE
Gestational Age(Wks): 39
Unit division: 0

## 2024-04-07 LAB — SURGICAL PATHOLOGY

## 2024-04-07 NOTE — Addendum Note (Signed)
 Addendum  created 04/07/24 1408 by Epifanio Charleston, MD   Intraprocedure Event edited

## 2024-04-08 ENCOUNTER — Inpatient Hospital Stay (HOSPITAL_COMMUNITY): Payer: MEDICAID

## 2024-04-08 NOTE — Addendum Note (Signed)
 Addendum  created 04/08/24 2122 by Erma Thom SAUNDERS, MD   Clinical Note Signed, Intraprocedure Blocks edited

## 2024-04-21 ENCOUNTER — Telehealth (HOSPITAL_COMMUNITY): Payer: Self-pay | Admitting: *Deleted

## 2024-04-21 NOTE — Telephone Encounter (Signed)
 04/21/2024  Name: Cassandra Harrison MRN: [id number] DOB: [birth year]  Reason for Call:  Transition of Care Hospital Discharge Call  Contact Status: Patient Contact Status: Complete  Language assistant needed: Interpreter Mode: Interpreter Not Needed        Follow-Up Questions: Do You Have Any Concerns About Your Health As You Heal From Delivery?: No Do You Have Any Concerns About Your Infants Health?: No  Edinburgh Postnatal Depression Scale:  In the Past 7 Days: I have been able to laugh and see the funny side of things.: As much as I always could I have looked forward with enjoyment to things.: As much as I ever did I have blamed myself unnecessarily when things went wrong.: No, never I have been anxious or worried for no good reason.: No, not at all I have felt scared or panicky for no good reason.: No, not at all Things have been getting on top of me.: No, I have been coping as well as ever I have been so unhappy that I have had difficulty sleeping.: Not at all I have felt sad or miserable.: No, not at all I have been so unhappy that I have been crying.: Only occasionally The thought of harming myself has occurred to me.: Never Edinburgh Postnatal Depression Scale Total: 1  PHQ2-9 Depression Scale:     Discharge Follow-up: Edinburgh score requires follow up?: No Patient was advised of the following resources:: Support Group, Breastfeeding Support Group (says her email is not working at the moment, advised patient of website where she can read more and/or sign up for groups of interest to her)  Post-discharge interventions: Reviewed Newborn Safe Sleep Practices  Mliss Sieve, RN 04/21/2024 14:40

## 2024-05-18 ENCOUNTER — Encounter: Payer: Self-pay | Admitting: Family Medicine

## 2024-05-18 ENCOUNTER — Ambulatory Visit: Payer: MEDICAID | Admitting: Family Medicine

## 2024-05-18 NOTE — Progress Notes (Signed)
 Post Partum Visit Note  Cassandra Harrison is a 33 y.o. [id number] female who presents for a postpartum visit. She is 6 weeks postpartum following a normal spontaneous vaginal delivery.  I have fully reviewed the prenatal and intrapartum course. The delivery was at [redacted]w[redacted]d gestational weeks.  Anesthesia: epidural. Postpartum course has been good. Baby is doing well. Baby is feeding by breast. Bleeding no bleeding. Bowel function is normal. Bladder function is normal. Patient is not sexually active. Contraception method is tubal ligation. Postpartum depression screening: negative.   The pregnancy intention screening data noted above was reviewed. Potential methods of contraception were discussed. The patient elected to proceed with No data recorded.   Edinburgh Postnatal Depression Scale - 05/18/24 1053       Edinburgh Postnatal Depression Scale:  In the Past 7 Days   I have been able to laugh and see the funny side of things. 0    I have looked forward with enjoyment to things. 0    I have blamed myself unnecessarily when things went wrong. 0    I have been anxious or worried for no good reason. 0    I have felt scared or panicky for no good reason. 0    Things have been getting on top of me. 0    I have been so unhappy that I have had difficulty sleeping. 0    I have felt sad or miserable. 0    I have been so unhappy that I have been crying. 0    The thought of harming myself has occurred to me. 0    Edinburgh Postnatal Depression Scale Total 0          Health Maintenance Due  Topic Date Due   Hepatitis B Vaccines 19-59 Average Risk (1 of 3 - 19+ 3-dose series) Never done   Pneumococcal Vaccine (2 of 2 - PCV) 12/09/2012   HPV VACCINES (1 - 3-dose SCDM series) Never done   COVID-19 Vaccine (1 - 2025-26 season) Never done    The following portions of the patient's history were reviewed and updated as appropriate: allergies, current medications, past family history, past medical  history, past social history, past surgical history, and problem list.  Review of Systems Pertinent items noted in HPI and remainder of comprehensive ROS otherwise negative.  Objective:  BP 115/73   Pulse 72   Ht 5' 2 (1.575 m)   Wt 257 lb 9.6 oz (116.8 kg)   LMP  (LMP Unknown)   Breastfeeding Yes   BMI 47.12 kg/m    General:  alert, cooperative, and appears stated age   Breasts:  not indicated  Lungs: Normal effort  Heart:  regular rate and rhythm  Abdomen: soft, non-tender; bowel sounds normal; no masses,  no organomegaly   GU exam:  not indicated       Assessment:    Normal postpartum exam.   Plan:   Essential components of care per ACOG recommendations:  1.  Mood and well being: Patient with negative depression screening today. Reviewed local resources for support.  - Patient tobacco use? Yes. Patient desires to quit? No.   - hx of drug use? Yes. Discussed support systems and outpatient/inpatient treatment options.    2. Infant care and feeding:  -Patient currently breastmilk feeding? Yes. Discussed returning to work and pumping.  -Social determinants of health (SDOH) reviewed in EPIC. No concerns  3. Sexuality, contraception and birth spacing - Patient does not want a  pregnancy in the next year.  Desired family size is 3 children.  - Reviewed reproductive life planning. Reviewed contraceptive methods based on pt preferences and effectiveness.  Patient desired Female Sterilization today.   - Discussed birth spacing of 18 months  4. Sleep and fatigue -Encouraged family/partner/community support of 4 hrs of uninterrupted sleep to help with mood and fatigue  5. Physical Recovery  - Discussed patients delivery and complications. She describes her labor as mixed. - Patient had a Vaginal, no problems at delivery. Patient had a 1st degree laceration. Perineal healing reviewed. Patient expressed understanding - Patient has urinary incontinence? No. - Patient is safe to  resume physical and sexual activity  6.  Health Maintenance - HM due items addressed Yes - Last pap smear  Diagnosis  Date Value Ref Range Status  05/01/2023 (A)  Final   - Atypical squamous cells of undetermined significance (ASC-US )   Pap smear not done at today's visit.  -Breast Cancer screening indicated? No.   7. Chronic Disease/Pregnancy Condition follow up: None  - PCP follow up  Glenys GORMAN Birk, MD Center for [facility], [facility]

## 2024-06-05 ENCOUNTER — Emergency Department (HOSPITAL_COMMUNITY): Payer: MEDICAID

## 2024-06-05 ENCOUNTER — Observation Stay (HOSPITAL_COMMUNITY)
Admission: EM | Admit: 2024-06-05 | Discharge: 2024-06-07 | Disposition: A | Payer: MEDICAID | Attending: Internal Medicine | Admitting: Internal Medicine

## 2024-06-05 DIAGNOSIS — Z91040 Latex allergy status: Secondary | ICD-10-CM | POA: Diagnosis not present

## 2024-06-05 DIAGNOSIS — J4521 Mild intermittent asthma with (acute) exacerbation: Secondary | ICD-10-CM

## 2024-06-05 DIAGNOSIS — Z87891 Personal history of nicotine dependence: Secondary | ICD-10-CM | POA: Diagnosis not present

## 2024-06-05 DIAGNOSIS — J4541 Moderate persistent asthma with (acute) exacerbation: Secondary | ICD-10-CM

## 2024-06-05 DIAGNOSIS — J9601 Acute respiratory failure with hypoxia: Principal | ICD-10-CM | POA: Diagnosis present

## 2024-06-05 DIAGNOSIS — J45901 Unspecified asthma with (acute) exacerbation: Secondary | ICD-10-CM | POA: Insufficient documentation

## 2024-06-05 DIAGNOSIS — J101 Influenza due to other identified influenza virus with other respiratory manifestations: Secondary | ICD-10-CM | POA: Diagnosis not present

## 2024-06-05 DIAGNOSIS — R0602 Shortness of breath: Secondary | ICD-10-CM | POA: Diagnosis present

## 2024-06-05 LAB — BASIC METABOLIC PANEL WITH GFR
Anion gap: 13 (ref 5–15)
BUN: 7 mg/dL (ref 6–20)
CO2: 22 mmol/L (ref 22–32)
Calcium: 9.7 mg/dL (ref 8.9–10.3)
Chloride: 100 mmol/L (ref 98–111)
Creatinine, Ser: 0.6 mg/dL (ref 0.44–1.00)
GFR, Estimated: >60 mL/min
Glucose, Bld: 110 mg/dL — ABNORMAL HIGH (ref 70–99)
Potassium: 4.5 mmol/L (ref 3.5–5.1)
Sodium: 135 mmol/L (ref 135–145)

## 2024-06-05 LAB — CBC
HCT: 41.8 % (ref 36.0–46.0)
Hemoglobin: 13.2 g/dL (ref 12.0–15.0)
MCH: 25.1 pg — ABNORMAL LOW (ref 26.0–34.0)
MCHC: 31.6 g/dL (ref 30.0–36.0)
MCV: 79.6 fL — ABNORMAL LOW (ref 80.0–100.0)
Platelets: 235 K/uL (ref 150–400)
RBC: 5.25 MIL/uL — ABNORMAL HIGH (ref 3.87–5.11)
RDW: 16.3 % — ABNORMAL HIGH (ref 11.5–15.5)
WBC: 5.8 K/uL (ref 4.0–10.5)
nRBC: 0 % (ref 0.0–0.2)

## 2024-06-05 LAB — RESP PANEL BY RT-PCR (RSV, FLU A&B, COVID)  RVPGX2
Influenza A by PCR: POSITIVE — AB
Influenza B by PCR: NEGATIVE
Resp Syncytial Virus by PCR: NEGATIVE
SARS Coronavirus 2 by RT PCR: NEGATIVE

## 2024-06-05 LAB — HCG, SERUM, QUALITATIVE: Preg, Serum: NEGATIVE

## 2024-06-05 MED ORDER — IPRATROPIUM-ALBUTEROL 0.5-2.5 (3) MG/3ML IN SOLN
3.0000 mL | Freq: Once | RESPIRATORY_TRACT | Status: AC
  Administered 2024-06-05: 3 mL via RESPIRATORY_TRACT
  Filled 2024-06-05: qty 3

## 2024-06-05 NOTE — ED Triage Notes (Signed)
 Pt complains of SOB, cough and chest pain x 3 days. Family member had flu. Pt has been using inhaler at home but isn't helping.

## 2024-06-06 DIAGNOSIS — J101 Influenza due to other identified influenza virus with other respiratory manifestations: Secondary | ICD-10-CM | POA: Diagnosis not present

## 2024-06-06 DIAGNOSIS — J45901 Unspecified asthma with (acute) exacerbation: Secondary | ICD-10-CM

## 2024-06-06 DIAGNOSIS — J9601 Acute respiratory failure with hypoxia: Principal | ICD-10-CM | POA: Diagnosis present

## 2024-06-06 MED ORDER — POLYETHYLENE GLYCOL 3350 17 G PO PACK: 17.0000 g | PACK | Freq: Every day | ORAL | Status: DC | PRN

## 2024-06-06 MED ORDER — ALBUTEROL SULFATE (2.5 MG/3ML) 0.083% IN NEBU
10.0000 mg | INHALATION_SOLUTION | Freq: Once | RESPIRATORY_TRACT | Status: AC
  Administered 2024-06-06: 10 mg via RESPIRATORY_TRACT
  Filled 2024-06-06: qty 12

## 2024-06-06 MED ORDER — ALBUTEROL SULFATE (2.5 MG/3ML) 0.083% IN NEBU: 2.5000 mg | INHALATION_SOLUTION | RESPIRATORY_TRACT | Status: DC | PRN

## 2024-06-06 MED ORDER — IPRATROPIUM-ALBUTEROL 0.5-2.5 (3) MG/3ML IN SOLN
3.0000 mL | Freq: Once | RESPIRATORY_TRACT | Status: AC
  Administered 2024-06-06: 3 mL via RESPIRATORY_TRACT
  Filled 2024-06-06: qty 3

## 2024-06-06 MED ORDER — LACTATED RINGERS IV BOLUS
1000.0000 mL | Freq: Once | INTRAVENOUS | Status: AC
  Administered 2024-06-06: 1000 mL via INTRAVENOUS

## 2024-06-06 MED ORDER — METHYLPREDNISOLONE SODIUM SUCC 125 MG IJ SOLR
125.0000 mg | Freq: Once | INTRAMUSCULAR | Status: AC
  Administered 2024-06-06: 125 mg via INTRAVENOUS
  Filled 2024-06-06: qty 2

## 2024-06-06 MED ORDER — OSELTAMIVIR PHOSPHATE 75 MG PO CAPS
75.0000 mg | ORAL_CAPSULE | Freq: Two times a day (BID) | ORAL | Status: DC
  Administered 2024-06-06 – 2024-06-07 (×3): 75 mg via ORAL
  Filled 2024-06-06 (×5): qty 1

## 2024-06-06 MED ORDER — MAGNESIUM SULFATE 2 GM/50ML IV SOLN
2.0000 g | Freq: Once | INTRAVENOUS | Status: AC
  Administered 2024-06-06: 2 g via INTRAVENOUS
  Filled 2024-06-06: qty 50

## 2024-06-06 MED ORDER — ACETAMINOPHEN 325 MG PO TABS
650.0000 mg | ORAL_TABLET | Freq: Once | ORAL | Status: AC
  Administered 2024-06-06: 650 mg via ORAL
  Filled 2024-06-06: qty 2

## 2024-06-06 MED ORDER — ENOXAPARIN SODIUM 40 MG/0.4ML IJ SOSY
40.0000 mg | PREFILLED_SYRINGE | INTRAMUSCULAR | Status: DC
  Administered 2024-06-06 – 2024-06-07 (×2): 40 mg via SUBCUTANEOUS
  Filled 2024-06-06 (×2): qty 0.4

## 2024-06-06 MED ORDER — ONDANSETRON HCL 4 MG PO TABS: 4.0000 mg | ORAL_TABLET | Freq: Three times a day (TID) | ORAL | Status: DC | PRN

## 2024-06-06 MED ORDER — ACETAMINOPHEN 325 MG PO TABS
650.0000 mg | ORAL_TABLET | Freq: Four times a day (QID) | ORAL | Status: DC
  Administered 2024-06-06 – 2024-06-07 (×5): 650 mg via ORAL
  Filled 2024-06-06 (×5): qty 2

## 2024-06-06 MED ORDER — PREDNISONE 20 MG PO TABS
40.0000 mg | ORAL_TABLET | Freq: Every day | ORAL | Status: DC
  Administered 2024-06-07: 40 mg via ORAL
  Filled 2024-06-06: qty 2

## 2024-06-06 MED ORDER — COCONUT OIL OIL: 1.0000 | TOPICAL_OIL | Status: DC | PRN

## 2024-06-06 NOTE — Lactation Note (Signed)
 Lactation Consultation Note  Patient Name: Cassandra Harrison Unijb'd Date: 06/06/2024 Age:33 y.o.   LC tubed more 4 ounce bottles to 6N (83) for MOB to pump into after calling the front desk to check on MOB's supply.   Recardo Hoit BS, IBCLC 06/06/2024, 10:54 PM

## 2024-06-06 NOTE — Lactation Note (Signed)
 Lactation Consultation Note  Patient Name: Cassandra Harrison Unijb'd Date: 06/06/2024 Age:33 y.o. Reason for consult: Initial assessment;MD order;Other (Comment);Exclusive pumping and bottle feeding;Engorgement (readmited to 6 N22 from the ER) Admitting dx - Acute Hypoxic Respiratory Failure.  62 month old at home.  I completed the Lactation consult visit. Set up the DEBP with instructions and instructed mom to pump 20 mins, and I stayed until there was only 10 mins left. She pumped off a total of 300 ml  EBM storage bottles and the milk volume was slowing down, and she was finishing her 20 mins total of pumping . Per mom good relief. I placed the bottles on ice in the basin and all the milk will have to be kept on ice until dad can come this evening to take it home on ice in the cooler the [facility] provided on the counter. Per mom she only pumps about 8 times a day and no latching at the breast. I checked all her meds and they all are compatible with breast feeding so her baby will be able to receive all the milk she pumps. All this information will be in the nurses note. Mom was ok with LC  calling her back on her phone to share the information about her meds.  LC called mom and she mentioned she got good relief from ice and pumping. Mom aware to pump both breast at least every 3 hours and storage guidelines.  LC sent a confidential Chat via Epic to Nurse Chaney.   Lovenox  - L2  Tamiflu - L2  Prednisone  40 mg daily - L2  Albuterol  Nebulizer - L1    Maternal Data Has patient been taught Hand Expression?:  (mom has been pumping for 2 months for her baby at home. and is familiar with hand expressing.) Does the patient have breastfeeding experience prior to this delivery?: Yes How long did the patient breastfeed?: 2 month at home  Feeding Mother's Current Feeding Choice: Breast Milk    Lactation Tools Discussed/Used Tools: Pump;Flanges Flange Size: 18;21;Other (comment) (used  #18 F with a  good  comfortable fit.) Breast pump type: Double-Electric Breast Pump;Manual Pump Education: Setup, frequency, and cleaning;Milk Storage Reason for Pumping: Exclusively pumping Pumping frequency: Pumping PRN and by 3 hours Pumped volume: 300 mL  Interventions  Education , DEBP. Storage of breast milk   Discharge Discharge Education: Engorgement and breast care Pump: Personal;DEBP;Manual (per mom has a DEBP Spectra )  Consult Status Consult Status: Follow-up Date: 06/07/24 Follow-up type: In-patient    Rollene Caldron Kyleena Scheirer 06/06/2024, 3:31 PM

## 2024-06-06 NOTE — H&P (Cosign Needed Addendum)
 " Date: 06/06/2024               Patient Name:  Cassandra [facility] MRN: [id number]  DOB: 07/16/1990 Age / Sex: 33 y.o., female   PCP: Ilah Crigler, MD         Medical Service: Internal Medicine Teaching Service         Attending Physician: Dr. Mliss Pouch      First Contact: Adrena Nakamura Amibilia, DO    Second Contact: Dr. Missy Sandhoff, MD         Pager Information: First Contact Pager: [phone]   Second Contact Pager: [phone]   SUBJECTIVE   Chief Complaint: SOB  History of Present Illness: Cassandra [facility] is a [phone] 33 y.o. female with PMH of asthma, GERD who presented to [facility] ED on 12/27 for worsening cough, congestion, and shortness of breath that has been present for 3 days. She states her family has been sick with the flu recently, with her 60 year old daughter having similar symptoms.  On 12/23, the patient began to develop myalgias, cough, congestion, sinus pressures, headache, fatigue, and decreased appetite. However she was still taking care of her daughter and her 67-month-old baby. On Christmas Day, she became short of breath and needed to use her albuterol  inhaler-which initially helped.  On 12/26, she became increasingly short of breath, needing to use her inhaler with increasing frequency and no relief.  She does endorse a fever of 100.4, chills and sweating, and some nausea, but she thinks her nausea is secondary to poor intake.  Denies chest pain, abdominal pain, or calf pain/swelling.  She notes that she delivered a baby boy in October 2025.  Prior to delivery, she received her updated flu, RSV, and Tdap vaccines.  She is currently breast-feeding and is without her breast pumps presently. The patient states she has had multiple hospitalizations in her childhood for asthma with none requiring intubation.  Prior to this admission, patient hardly used her albuterol  inhaler and was able to manage her asthma through trigger avoidance and breathing control.   ED Course: Labs  significant for positive influenza A on RPP.  Labs unremarkable, urine pregnancy test negative.  Patient was found to be tachycardic, with elevated temperatures to 100.2 F, and tachypneic saturating down to 91% on room air.  Received 2 rounds of DuoNeb treatments and IV Solu-Medrol  to help her oxygen requirements.  Also received IV magnesium  sulfate and albuterol  nebulizer without improvement, and began to desaturate to mid 80s% on room air.  No change in mentation. Patient was placed on 3 L nasal cannula with 3 saturation of oxygenation to 97%. Consulted IMTS for admission.  Imaging: CXR showing central bronchial thickening in both lungs consistent with bronchitis.  No evidence of pneumonia, pleural effusion, or pneumothorax.  No abnormality of the cardiac or mediastinal silhouette.  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 14 pack-year history, previously 1ppd, but weaned to nothing at the birth of her son in 03/2024.  -Alcohol: Prior, does not drink anymore. Quit 2 years ago. -Recreational Drug: Prior, previous methamphetamine and THC  Family History:  Family History  Problem Relation Age of Onset   Diabetes Mother    COPD Mother    Heart disease Mother    Cancer Mother        throat cancer    COPD Father    Heart disease Father    Cancer Sister    Cancer Paternal Aunt    Diabetes Maternal Grandmother    Cancer Maternal Grandmother    COPD Paternal Grandmother    Heart disease Paternal Grandfather    Other Neg Hx      Allergies: Allergies as of 06/05/2024 - Reviewed 06/05/2024  Allergen Reaction Noted   Olive oil Nausea And Vomiting and Other (See Comments) 02/21/2013   Latex Rash and Other (See Comments) 04/09/2011    Review of Systems: A complete ROS was negative except as per HPI.   OBJECTIVE:   Physical Exam: Blood pressure 127/72, pulse (!) 129, temperature 98.8 F (37.1 C), temperature source Oral, resp. rate (!) 22, SpO2 97%, currently breastfeeding.  Constitutional: ill-appearing female sitting in bed, in no acute distress HENT: normocephalic atraumatic, mucous membranes moist Eyes: conjunctiva non-erythematous Neck: supple, no JVD Cardiovascular: tachycardia, normal rhythm, no m/r/g Pulmonary/Chest: Tachypneic breathing on 3L [place], bilateral wheezing to upper lung fields with decreased breath sounds. No accessory muscle use. Abdominal: soft, non-tender, non-distended, normoactive bowel sounds Neurological: alert & oriented x 3, 5/5 strength in bilateral upper and lower extremities Skin: warm and dry, radial pulses 2+ bilaterally Psych: normal mood and affect  Labs: CBC    Component Value Date/Time   WBC 5.8 06/05/2024 2102   RBC 5.25 (H) 06/05/2024 2102   HGB 13.2 06/05/2024 2102   HGB 11.7 01/14/2024 0825   HCT 41.8 06/05/2024 2102   HCT 37.4 01/14/2024 0825   PLT 235 06/05/2024 2102   PLT 211 01/14/2024 0825   MCV 79.6 (L) 06/05/2024 2102   MCV 88 01/14/2024 0825   MCH 25.1 (L) 06/05/2024 2102   MCHC 31.6 06/05/2024 2102   RDW 16.3 (H) 06/05/2024 2102   RDW 12.8 01/14/2024 0825   LYMPHSABS 1.3 11/07/2023 1413   MONOABS 0.4 06/22/2023 1352   EOSABS 0.1 11/07/2023 1413   BASOSABS 0.0 11/07/2023 1413     CMP     Component Value Date/Time    NA 135 06/05/2024 2102   K 4.5 06/05/2024 2102   CL 100 06/05/2024 2102   CO2 22 06/05/2024 2102   GLUCOSE 110 (H) 06/05/2024 2102   BUN 7 06/05/2024 2102   CREATININE 0.60 06/05/2024 2102   CALCIUM 9.7 06/05/2024 2102   PROT 6.0 (L) 06/22/2023 1352   ALBUMIN 3.6 06/22/2023 1352   AST 25 06/22/2023 1352   ALT 22 06/22/2023 1352   ALKPHOS 47 06/22/2023 1352   BILITOT 0.6 06/22/2023 1352   GFRNONAA >60 06/05/2024 2102   GFRAA >60 03/01/2020 1548    Imaging: DG Chest 2 View Result Date: 06/05/2024 EXAM: 2 VIEW(S) XRAY OF THE CHEST 06/05/2024 09:31:00 PM COMPARISON: PA lateral 11/25/2023. CLINICAL HISTORY: Shortness of breath, coughing, and chest pain for 3 days. Sick contact at home with the flu. FINDINGS: LUNGS AND PLEURA: Central bronchial thickening in both lungs, without evidence of focal pneumonia. Findings consistent with bronchitis. No pleural effusion. No pneumothorax. HEART  AND MEDIASTINUM: No acute abnormality of the cardiac and mediastinal silhouettes. BONES AND SOFT TISSUES: No acute osseous abnormality. IMPRESSION: 1. Findings consistent with bronchitis. 2. No focal pneumonia. Electronically signed by: Francis Quam MD 06/05/2024 10:51 PM EST RP Workstation: [id number]     EKG: personally reviewed my interpretation is sinus tachycardia. Prior EKG NSR.  ASSESSMENT & PLAN:   Assessment & Plan by Problem: Principal Problem:   Acute hypoxic respiratory failure (HCC)   Cassandra [facility] is a 33 y.o. person living with a history of asthma, GERD who presented with increasing shortness of breath and admitted for acute hypoxic respiratory failure secondary to asthma exacerbation from positive influenza A.    #Acute Hypoxic Respiratory Failure #2/2 Asthma Exacerbation from Positive Influenza A Presented to ED tachypneic, tachycardic, borderline febrile with worsening oxygenation status in the last 2 days. Home albuterol  inhaler not providing improvement. Received IV  Solu-Medrol , 2 DuoNeb treatments, and magnesium  sulfate in ED without significant oxygenation improvement.  On 3 L nasal cannula saturating 97%.  Patient reports improvement with supplemental oxygen.  No respiratory distress, accessory muscle use, or changes in mentation concerning for further worsening of asthma.  Found to be flu positive despite vaccinated status, will begin Tamiflu  twice daily given associated benefits for asthmatic patients requiring hospitalization.  Does not see pulmonology outpatient.  Will monitor peak expiratory flow rate to monitor for daily clinical improvement and risk assessment for deterioration into status asthmaticus.  Encouraged proper PO fluid and food intake as tolerated. - 1 L LR bolus - Peak expiratory flow rate monitoring  - If patient starts to decompensate with increasing O2 needs, consider ABG - Start Tamiflu  75 mg twice daily (D1= 12/27) for 5 days given benefit for asthma exacerbation  - Monitor O2 saturation with goal > 92%, wean as tolerated off 3L [place] - Albuterol  nebulizer every 4 hours as needed - Continue steroids with prednisone  40mg  for 4 doses, ending treatment on 12/31. - Monitor BMP - Zofran  as needed for nausea  #Breastfeeding Patient currently 8wks postpartum, electing to breastfeed her baby. On admission, she is without her home breast pumps to continuously lactate. She is concerned about clogs, potential for mastitis, and inability to feed her son. Will consult lactation services for breast pumps while patient is admitted, or until patient's partner is able to bring home pumps and storage containers to room. - Lactation nurse consult, appreciate help - Coconut oil as needed  - Consider medication excretion through breast milk   Best practice: Diet: Normal VTE: Enoxaparin  IVF: 1L LR bolus Code: Full  Disposition planning: Prior to Admission Living Arrangement: Home, living with boyfriend Anticipated Discharge Location: Home  Dispo:  Admit patient to Observation with expected length of stay less than 2 midnights.  Signed: Leighana Neyman, DO Internal Medicine Resident  06/06/2024, 2:09 PM  On Call pager: [phone]  "

## 2024-06-06 NOTE — ED Provider Notes (Addendum)
 " [facility] EM[facility]ENC[facility] DE[facility] AT [facility] Provider Note   CSN: [id number] Arrival date & time: 06/05/24  2044     Patient presents with: Shortness of Breath and Cough   Cassandra [facility] is a 33 y.o. female.   33 year old female with history of asthma presents with 3 days of cough, congestion, SHOB. Exposed to family members with similar symptoms. Has been using her inhaler without any relief. Admission as a child for asthma, no intubations.        Prior to Admission medications  Medication Sig Start Date End Date Taking? Authorizing Provider  acetaminophen  (TYLENOL ) 500 MG tablet Take 2 tablets (1,000 mg total) by mouth every 6 (six) hours as needed for fever. 04/06/24   Jomarie Charlie LABOR, MD  albuterol  (VENTOLIN  HFA) 108 (90 Base) MCG/ACT inhaler Inhale 1-2 puffs into the lungs every 6 (six) hours as needed for wheezing or shortness of breath. 11/26/23   Wallace Joesph LABOR, PA  benzocaine -Menthol  (DERMOPLAST) 20-0.5 % AERO Apply 1 Application topically as needed for irritation (perineal discomfort). Patient not taking: Reported on 05/18/2024 04/06/24   Jomarie Charlie LABOR, MD  Blood Pressure Monitoring (BLOOD PRESSURE KIT) DEVI 1 Device by Does not apply route once a week. Patient not taking: Reported on 05/18/2024 11/06/23   Zina Jerilynn LABOR, MD  coconut oil OIL Apply 1 Application topically as needed. 04/06/24   Jomarie Charlie LABOR, MD  dibucaine (NUPERCAINAL) 1 % OINT Place 1 Application rectally as needed for hemorrhoids. Patient not taking: Reported on 05/18/2024 04/06/24   Jomarie Charlie LABOR, MD  ibuprofen  (ADVIL ) 600 MG tablet Take 1 tablet (600 mg total) by mouth every 6 (six) hours. Patient not taking: Reported on 05/18/2024 04/06/24   Jomarie Charlie LABOR, MD  pantoprazole  (PROTONIX ) 40 MG tablet Take 1 tablet (40 mg total) by mouth 2 (two) times daily before a meal. Patient not taking: Reported on 05/18/2024 02/26/24   Rudy Carlin LABOR, MD  Prenatal Vit-Fe  Phos-FA-Omega (VITAFOL  GUMMIES) 3.33-0.333-34.8 MG CHEW Chew 3 tablets by mouth daily. 11/06/23   Zina Jerilynn LABOR, MD  senna-docusate (SENOKOT-S) 8.6-50 MG tablet Take 2 tablets by mouth daily. Patient not taking: Reported on 05/18/2024 04/07/24   Jomarie Charlie LABOR, MD  witch hazel-glycerin  (TUCKS) pad Apply 1 Application topically as needed for hemorrhoids. Patient not taking: Reported on 05/18/2024 04/06/24   Jomarie Charlie LABOR, MD    Allergies: Olive oil and Latex    Review of Systems Negative except as per HPI Updated Vital Signs BP 114/82 (BP Location: Left Arm)   Pulse (!) 110   Temp 100 F (37.8 C) (Oral)   Resp 18   SpO2 91%   Physical Exam Vitals and nursing note reviewed.  Constitutional:      General: She is not in acute distress.    Appearance: She is well-developed. She is not diaphoretic.  HENT:     Head: Normocephalic and atraumatic.     Right Ear: Tympanic membrane and ear canal normal.     Left Ear: Tympanic membrane and ear canal normal.     Mouth/Throat:     Mouth: Mucous membranes are moist.  Cardiovascular:     Rate and Rhythm: Regular rhythm. Tachycardia present.  Pulmonary:     Effort: Tachypnea present.     Breath sounds: Decreased breath sounds and wheezing present.  Musculoskeletal:     Right lower leg: No tenderness. No edema.     Left lower leg: No tenderness. No  edema.  Skin:    General: Skin is warm and dry.  Neurological:     Mental Status: She is alert and oriented to person, place, and time.  Psychiatric:        Behavior: Behavior normal.     (all labs ordered are listed, but only abnormal results are displayed) Labs Reviewed  RESP PANEL BY RT-PCR (RSV, FLU A&B, COVID)  RVPGX2 - Abnormal; Notable for the following components:      Result Value   Influenza A by PCR POSITIVE (*)    All other components within normal limits  BASIC METABOLIC PANEL WITH GFR - Abnormal; Notable for the following components:   Glucose, Bld 110 (*)    All  other components within normal limits  CBC - Abnormal; Notable for the following components:   RBC 5.25 (*)    MCV 79.6 (*)    MCH 25.1 (*)    RDW 16.3 (*)    All other components within normal limits  HCG, SERUM, QUALITATIVE    EKG: None  Radiology: DG Chest 2 View Result Date: 06/05/2024 EXAM: 2 VIEW(S) XRAY OF THE CHEST 06/05/2024 09:31:00 PM COMPARISON: PA lateral 11/25/2023. CLINICAL HISTORY: Shortness of breath, coughing, and chest pain for 3 days. Sick contact at home with the flu. FINDINGS: LUNGS AND PLEURA: Central bronchial thickening in both lungs, without evidence of focal pneumonia. Findings consistent with bronchitis. No pleural effusion. No pneumothorax. HEART AND MEDIASTINUM: No acute abnormality of the cardiac and mediastinal silhouettes. BONES AND SOFT TISSUES: No acute osseous abnormality. IMPRESSION: 1. Findings consistent with bronchitis. 2. No focal pneumonia. Electronically signed by: Francis Quam MD 06/05/2024 10:51 PM EST RP Workstation: [id number]     Procedures   Medications Ordered in the ED  magnesium  sulfate IVPB 2 g 50 mL (2 g Intravenous New Bag/Given 06/06/24 0600)  ipratropium-albuterol  (DUONEB) 0.5-2.5 (3) MG/3ML nebulizer solution 3 mL (3 mLs Nebulization Given 06/05/24 2132)  acetaminophen  (TYLENOL ) tablet 650 mg (650 mg Oral Given 06/06/24 0554)  ipratropium-albuterol  (DUONEB) 0.5-2.5 (3) MG/3ML nebulizer solution 3 mL (3 mLs Nebulization Given 06/06/24 0556)  methylPREDNISolone  sodium succinate (SOLU-MEDROL ) 125 mg/2 mL injection 125 mg (125 mg Intravenous Given 06/06/24 0554)  albuterol  (PROVENTIL ) (2.5 MG/3ML) 0.083% nebulizer solution 10 mg (10 mg Nebulization Given 06/06/24 0617)                                    Medical Decision Making Amount and/or Complexity of Data Reviewed Labs: ordered. Radiology: ordered.  Risk OTC drugs. Prescription drug management.   This patient presents to the ED for concern of cough, SHOB, this  involves an extensive number of treatment options, and is a complaint that carries with it a high risk of complications and morbidity.  The differential diagnosis includes flu, PNA, asthma exacerbation    Co morbidities / Chronic conditions that complicate the patient evaluation  asthma   Additional history obtained:  Additional history obtained from EMR External records from outside source obtained and reviewed including prior labs and imaging    Lab Tests:  I Ordered, and personally interpreted labs.  The pertinent results include: hCG negative.  CBC without significant findings.  BMP without significant findings.  Is positive for influenza A.   Imaging Studies ordered:  I ordered imaging studies including chest x-ray I independently visualized and interpreted imaging which showed bronchitic changes I agree with the radiologist interpretation   Cardiac  Monitoring: / EKG:  The patient was maintained on a cardiac monitor.  I personally viewed and interpreted the cardiac monitored which showed an underlying rhythm of: Sinus tachycardia, rate 114   Problem List / ED Course / Critical interventions / Medication management  33 year old female with history of asthma presents with 3 days of flulike symptoms after exposure to family members who have tested positive for the flu.  Notes that she has asthma and is notably short of breath without improvement with her inhaler.  O2 91% on room air.  She is mildly tachycardic and borderline febrile.  She is provided with Tylenol , continuous neb, magnesium  and Solu-Medrol .  Chest x-ray shows bronchitis.  Patient is signed out to oncoming provider at change of shift.  May need admission if not improving. I ordered medication including Solu-Medrol , Tylenol , continuous neb Reevaluation of the patient after these medicines showed that the patient pending reevaluation at time of signout to oncoming provider I have reviewed the patients home medicines  and have made adjustments as needed   Social Determinants of Health:  Has PCP, lives with family   Test / Admission - Considered:  Disposition pending at time of signout to oncoming provider      Final diagnoses:  Influenza A  Moderate persistent asthma with exacerbation    ED Discharge Orders     None          Beverley Leita LABOR, PA-C 06/06/24 0626    Beverley Leita LABOR, PA-C 06/06/24 [id number]    Theadore Ozell HERO, MD 06/06/24 9353    Theadore Ozell HERO, MD 06/26/24 [phone]  "

## 2024-06-06 NOTE — Progress Notes (Signed)
 RT attempted to obtain an abg but was unsuccessful, RN notified.

## 2024-06-06 NOTE — Hospital Course (Addendum)
 Cassandra [facility] is a 33 y.o. person living with a history of asthma, GERD who presented with increasing shortness of breath and admitted for acute hypoxic respiratory failure secondary to asthma exacerbation from positive influenza A.     #Acute Hypoxic Respiratory Failure #2/2 Asthma Exacerbation from Positive Influenza A Presented to ED tachypneic, tachycardic, borderline febrile with worsening oxygenation status since Christmas day. Home albuterol  inhaler did not provide improvement. Received IV Solu-Medrol , 2 DuoNeb treatments, and magnesium  sulfate in ED without significant oxygenation improvement, saturating 85% on RA. Was placed on 3L nasal cannula, improved to 97% with supplemental oxygen.  No respiratory distress, accessory muscle use, or changes in mentation concerning for further worsening of asthma during initial evaluation. Influenza A positive despite vaccinated status. Started Tamiflu  75mg  twice daily on 12/27 for 5 days given associated benefits for asthmatic patients requiring hospitalization.  Does not see pulmonology outpatient.  Monitored peak expiratory flow rate for daily clinical improvement and risk assessment for deterioration into status asthmaticus.  Encouraged proper PO fluid and food intake as tolerated, given 1L LR for rehydration as patient with decreased PO intake prior to admission. Monitored O2 saturation with goal > 92%, and weaned 3L [place] as tolerated.  Patient able to ambulate while maintaining her saturations on day of discharge.  Discharge with prednisone  40 and Tamiflu  to complete 5-day courses of both, end of therapy 12/31.  Discharged with new ICS+LABA inhaler. Also recommended that patient reach out to her pediatrician to ask about prophylactic flu medication for her 49-month-old.   #Breastfeeding Patient currently 8wks postpartum, electing to breastfeed her baby. On admission, she was without her home breast pumps to continuously lactate. She was concerned about  clogs, potential for mastitis, and inability to feed her son. Consulted lactation services for breast pumps during admission or until patient's partner could bring home pumps and storage containers to room. Ordered coconut oil as barrier cream for areola/nipple. Cautious with medications given to patient given potential excretion through breast milk and active breastfeeding.

## 2024-06-06 NOTE — ED Provider Notes (Signed)
 Patient is a 33 year old female who was signed out to myself at shift change.  She presented to the emergency department the chief complaint of cough, congestion, shortness of breath which has been worsening over the past 3 days.  She does have a history of asthma.  She was hypoxic on presentation and has received magnesium , Solu-Medrol , multiple breathing treatments and was noted to be positive for influenza A.  She has a known noted positive exposure to influenza.  Dispo pending reevaluation. Physical Exam  BP 114/82 (BP Location: Left Arm)   Pulse (!) 110   Temp 100 F (37.8 C) (Oral)   Resp 18   SpO2 91%   Physical Exam  Procedures  Procedures  ED Course / MDM    Medical Decision Making Amount and/or Complexity of Data Reviewed Labs: ordered. Radiology: ordered.  Risk OTC drugs. Prescription drug management. Decision regarding hospitalization.   Patient does have improved wheezing on exam but is now requiring 3 L nasal cannula as her oxygen saturations did drop to approximate 85% on room air on my reevaluation.  Will plan for admission to the hospital service and have discussed patient case with the hospitalist.  Patient does not appear to have acute distress warranting BiPAP or intubation at this point.  Tachycardia does appear to be secondary to the multiple breathing treatments.       Daralene Lonni BIRCH, PA-C 06/06/24 [id number]    Tegeler, Lonni PARAS, MD 06/06/24 [phone]

## 2024-06-06 NOTE — Plan of Care (Signed)
" °  Problem: Health Behavior/Discharge Planning: Goal: Ability to manage health-related needs will improve 06/06/2024 1616 by Andrez Galley, RN Outcome: Progressing 06/06/2024 1616 by Andrez Galley, RN Outcome: Progressing   Problem: Clinical Measurements: Goal: Will remain free from infection Outcome: Progressing Goal: Respiratory complications will improve Outcome: Progressing   Problem: Activity: Goal: Risk for activity intolerance will decrease Outcome: Progressing   Problem: Nutrition: Goal: Adequate nutrition will be maintained Outcome: Progressing   "

## 2024-06-06 NOTE — Plan of Care (Signed)
?  Problem: Health Behavior/Discharge Planning: ?Goal: Ability to manage health-related needs will improve ?Outcome: Progressing ?  ?Problem: Clinical Measurements: ?Goal: Will remain free from infection ?Outcome: Progressing ?Goal: Respiratory complications will improve ?Outcome: Progressing ?  ?

## 2024-06-06 NOTE — Progress Notes (Signed)
 Assisting with patient's admission and she mentioned recent birth and breast feeding/pumping. Breast pump and kit obtained from pediatric unit and lactation consult made aware. Patient resting comfortably and given ice packs per lactation consult recommendation.  -Armida Ada SWOT RN

## 2024-06-06 NOTE — ED Notes (Signed)
 PA put pt on 2L of O2, O2 was 85% room air.

## 2024-06-07 ENCOUNTER — Other Ambulatory Visit (HOSPITAL_COMMUNITY): Payer: Self-pay

## 2024-06-07 DIAGNOSIS — J9601 Acute respiratory failure with hypoxia: Secondary | ICD-10-CM | POA: Diagnosis not present

## 2024-06-07 DIAGNOSIS — J45901 Unspecified asthma with (acute) exacerbation: Secondary | ICD-10-CM | POA: Diagnosis not present

## 2024-06-07 DIAGNOSIS — J101 Influenza due to other identified influenza virus with other respiratory manifestations: Secondary | ICD-10-CM | POA: Diagnosis not present

## 2024-06-07 LAB — BASIC METABOLIC PANEL WITH GFR
Anion gap: 9 (ref 5–15)
BUN: 14 mg/dL (ref 6–20)
CO2: 27 mmol/L (ref 22–32)
Calcium: 9.6 mg/dL (ref 8.9–10.3)
Chloride: 103 mmol/L (ref 98–111)
Creatinine, Ser: 0.61 mg/dL (ref 0.44–1.00)
GFR, Estimated: >60 mL/min
Glucose, Bld: 117 mg/dL — ABNORMAL HIGH (ref 70–99)
Potassium: 4.2 mmol/L (ref 3.5–5.1)
Sodium: 139 mmol/L (ref 135–145)

## 2024-06-07 MED ORDER — OSELTAMIVIR PHOSPHATE 75 MG PO CAPS
75.0000 mg | ORAL_CAPSULE | Freq: Two times a day (BID) | ORAL | 0 refills | Status: DC
  Filled 2024-06-07: qty 6, 3d supply, fill #0

## 2024-06-07 MED ORDER — ALUM & MAG HYDROXIDE-SIMETH 200-200-20 MG/5ML PO SUSP
15.0000 mL | ORAL | Status: DC | PRN
  Administered 2024-06-07: 15 mL via ORAL
  Filled 2024-06-07: qty 30

## 2024-06-07 MED ORDER — IPRATROPIUM-ALBUTEROL 0.5-2.5 (3) MG/3ML IN SOLN: 3.0000 mL | Freq: Two times a day (BID) | RESPIRATORY_TRACT | Status: DC

## 2024-06-07 MED ORDER — IPRATROPIUM-ALBUTEROL 0.5-2.5 (3) MG/3ML IN SOLN: 3.0000 mL | RESPIRATORY_TRACT | 0 refills | Status: AC | PRN

## 2024-06-07 MED ORDER — OSELTAMIVIR PHOSPHATE 75 MG PO CAPS: 75.0000 mg | ORAL_CAPSULE | Freq: Two times a day (BID) | ORAL | 0 refills | Status: DC

## 2024-06-07 MED ORDER — IPRATROPIUM-ALBUTEROL 0.5-2.5 (3) MG/3ML IN SOLN
3.0000 mL | RESPIRATORY_TRACT | Status: DC
  Administered 2024-06-07: 3 mL via RESPIRATORY_TRACT
  Filled 2024-06-07: qty 3

## 2024-06-07 MED ORDER — BUDESONIDE-FORMOTEROL FUMARATE 80-4.5 MCG/ACT IN AERO
2.0000 | INHALATION_SPRAY | Freq: Every day | RESPIRATORY_TRACT | 12 refills | Status: DC
  Filled 2024-06-07: qty 10.2, 30d supply, fill #0

## 2024-06-07 MED ORDER — PREDNISONE 20 MG PO TABS: 40.0000 mg | ORAL_TABLET | Freq: Every day | ORAL | 0 refills | Status: DC

## 2024-06-07 MED ORDER — PREDNISONE 20 MG PO TABS: 40.0000 mg | ORAL_TABLET | Freq: Every day | ORAL | 0 refills | Status: AC

## 2024-06-07 MED ORDER — PREDNISONE 20 MG PO TABS
40.0000 mg | ORAL_TABLET | Freq: Every day | ORAL | 0 refills | Status: DC
  Filled 2024-06-07: qty 6, 3d supply, fill #0

## 2024-06-07 MED ORDER — IPRATROPIUM-ALBUTEROL 0.5-2.5 (3) MG/3ML IN SOLN: 3.0000 mL | RESPIRATORY_TRACT | 0 refills | Status: DC | PRN

## 2024-06-07 MED ORDER — BUDESONIDE-FORMOTEROL FUMARATE 80-4.5 MCG/ACT IN AERO: 2.0000 | INHALATION_SPRAY | Freq: Every day | RESPIRATORY_TRACT | 12 refills | Status: DC

## 2024-06-07 MED ORDER — ORAL CARE MOUTH RINSE: 15.0000 mL | OROMUCOSAL | Status: DC | PRN

## 2024-06-07 MED ORDER — BUDESONIDE-FORMOTEROL FUMARATE 80-4.5 MCG/ACT IN AERO: 2.0000 | INHALATION_SPRAY | Freq: Every day | RESPIRATORY_TRACT | 12 refills | Status: AC

## 2024-06-07 MED ORDER — OSELTAMIVIR PHOSPHATE 75 MG PO CAPS: 75.0000 mg | ORAL_CAPSULE | Freq: Two times a day (BID) | ORAL | 0 refills | Status: AC

## 2024-06-07 NOTE — Lactation Note (Signed)
 Lactation Consultation Note  Patient Name: Cassandra Harrison Unijb'd Date: 06/07/2024 Age:33 y.o. Reason for consult: Follow-up assessment;Other (Comment);Term;Exclusive pumping and bottle feeding (Maternal Readmission)    LC and LC student visited with 2 month PP mom. Currently admitted for Acute Hypoxic Respiratory Failure.When [facility] and LC student entered room mom had a total of 4 storage bottles on ice in basin. Mom states that she is exclusively pumping and does not bring baby to breast.  LC and LC student educated mom benefits of power pumping in case her supply took a dip or to keep up with baby Cassandra Harrison's growth. Mom was also given NICU cooler for traveling home after her discharge.  Mom has Spectra  S2 that she uses at home. States that she tries to pumps about 6 times per 24 hour period. Mom was praised for all of her efforts.   Feeding Mother's Current Feeding Choice: Breast Milk   Lactation Tools Discussed/Used Tools: Other (comment);Flanges;Pump (NICU Cooler for Bottles) Flange Size: 18;21 Breast pump type: Double-Electric Breast Pump Pump Education: Setup, frequency, and cleaning;Milk Storage Reason for Pumping: Infant Separation, Mom admitted to 6N Pumping frequency: 6 Pumped volume: 90 mL (90-18ml)  Interventions Interventions: Breast feeding basics reviewed;Education;DEBP;Expressed milk Plan: Continuing pumping ever 3 hours or whenever baby is receiving bottle Power pump 2x day, morning or evening, if noticing baby's demand is outgrowing maternal supply.  Only mom was present for visit. All concerns and questions were addressed and she is to follow up with lactation if needed.  Discharge Pump: DEBP (Spectra  s2)  Consult Status  Consult Status: Complete Date: 06/07/24 Follow-up type: Call as needed    Marianne Daring 06/07/2024, 3:26 PM

## 2024-06-07 NOTE — Progress Notes (Signed)
 Patient has received her discharge papers and is currently waiting for Nebulizer to be delivered to her room.

## 2024-06-07 NOTE — Discharge Instructions (Addendum)
 You came to the hospital for shortness of breath and you were diagnosed with the flu and an asthma exacerbation form the flu.  We treated you with breathing treatments and tamiflu .   *For your asthma exacerbation  -We have started you on these following medications:  -Prednisone : take two tablets with breakfast daily starting tomorrow  -Symbicort  inhaler: Inhale 2 puffs daily, you may also use this inhaler as needed for shortness of breath and/or wheezing: be sure to brush your teeth after use to prevent thrush infection   -Nebulizer with duonebs (breathing treatments) use this every 4-6 hours as needed for shortness of breath or wheezing, while you are feeling ill: use the nebulizer AS NEEDED instead of the Symbicort  (you may use Symbicort  daily during this time) and you may start to use symbicort  as needed once you start to feel better and are no longer using the duonebs)  -We have stopped the following medications:  -Albuterol  inhaler: the Symbicort  inhaler replaces the needed for an as needed albuterol  inhaler   -Please see PCP in 7 to 10 days   *For your flu infection -We have started you on these following medications:  -Tamiflu : take one tablet every morning and night starting tonight   -REMEMBER TO DISCUSS PREVENTION THERAPY AGAINST INFLUENZA FOR YOUR INFANT. PLEASE CALL AND DISCUSS THIS WITH THEIR OFFICE  Follow-up appointments: Please visit your family doctor in 7 to 10 days  If you have any questions or concerns please feel free to call: Internal medicine clinic at [phone]   If you have any of these following symptoms, please call us  or seek care at an emergency department: -Chest Pain -Difficulty Breathing -Syncope (passing out) -Drooping of face -Slurred speech -Sudden weakness in your leg or arm -Fever -Chills   We are glad that you are feeling better, it was a pleasure to care for you!

## 2024-06-07 NOTE — Plan of Care (Signed)
   Problem: Education: Goal: Knowledge of General Education information will improve Description: Including pain rating scale, medication(s)/side effects and non-pharmacologic comfort measures Outcome: Progressing   Problem: Clinical Measurements: Goal: Respiratory complications will improve Outcome: Progressing   Problem: Activity: Goal: Risk for activity intolerance will decrease Outcome: Progressing

## 2024-06-07 NOTE — Discharge Summary (Signed)
 "  Name: Cassandra Harrison MRN: [id number] DOB: 31-Jul-1990 32 y.o. PCP: Ilah Crigler, MD  Date of Admission: 06/05/2024  8:46 PM Date of Discharge: 06/07/2024 Attending Physician: Dr. Mliss Pouch  Discharge Diagnosis: 1. Principal Problem:   Acute hypoxic respiratory failure [facility])    Discharge Medications: Allergies as of 06/07/2024       Reactions   Olive Oil Nausea And Vomiting, Other (See Comments)   She can not have anything with olives in it   Latex Rash, Other (See Comments)   Patient states that she is allergic to latex condoms.  They give her a rash and a yeast infection.        Medication List     STOP taking these medications    A.E.R. Witch Hazel pad Generic drug: witch hazel-glycerin    albuterol  108 (90 Base) MCG/ACT inhaler Commonly known as: VENTOLIN  HFA   benzocaine -Menthol  20-0.5 % Aero Commonly known as: DERMOPLAST   Blood Pressure Kit Devi   dibucaine 1 % Oint Commonly known as: NUPERCAINAL   ibuprofen  600 MG tablet Commonly known as: ADVIL    pantoprazole  40 MG tablet Commonly known as: Protonix    Stool Softener/Laxative 50-8.6 MG tablet Generic drug: senna-docusate   Vitafol  Gummies 3.33-0.333-34.8 MG Chew       TAKE these medications    Acetaminophen  Extra Strength 500 MG Tabs Take 2 tablets (1,000 mg total) by mouth every 6 (six) hours as needed for fever.   budesonide -formoterol  80-4.5 MCG/ACT inhaler Commonly known as: Symbicort  Inhale 2 puffs into the lungs daily. You may also use inhaler as needed for shortness of breath and wheezing.   coconut oil Oil Apply 1 Application topically as needed.   ipratropium-albuterol  0.5-2.5 (3) MG/3ML Soln Commonly known as: DUONEB Take 3 mLs by nebulization every 4 (four) hours as needed (For wheezing or shortness of breath).   oseltamivir  75 MG capsule Commonly known as: TAMIFLU  Take 1 capsule (75 mg total) by mouth 2 (two) times daily for 3 days.   predniSONE  20 MG  tablet Commonly known as: DELTASONE  Take 2 tablets (40 mg total) by mouth daily with breakfast for 3 days. Start taking on: June 08, 2024               Durable Medical Equipment  (From admission, onward)           Start     Ordered   06/07/24 0000  For home use only DME Nebulizer machine       Question Answer Comment  Patient needs a nebulizer to treat with the following condition Asthma exacerbation   Length of Need 6 Months   Additional equipment included Filter   Additional equipment included Administration kit      06/07/24 1229            Disposition and follow-up:   Cassandra Harrison was discharged from [facility] in Stable condition.  At the hospital follow up visit please address:  1.  Flu A with asthma exacerbation: Sent home with new ICS + LABA inhaler, prednisone  and tamiflu . Ensure symptoms have resolved. Adjust asthma medication recommended as necessary. Also encouraged patient to call pediatrician to discuss flu prophylaxis for her 80 month old.   2.  Labs / imaging needed at time of follow-up: none  3.  Pending labs/ test needing follow-up: none  Follow-up Appointments:  Follow-up Information     Ilah Crigler, MD. Call today.   Specialty: Family Medicine Why: to make a  hospital follow up appointment Contact information: [address] [place] [address] [phone]                  Hospital Course by problem list:  Cassandra Harrison is a 33 y.o. person living with a history of asthma, GERD who presented with increasing shortness of breath and admitted for acute hypoxic respiratory failure secondary to asthma exacerbation from positive influenza A.     #Acute Hypoxic Respiratory Failure #2/2 Asthma Exacerbation from Positive Influenza A Presented to ED tachypneic, tachycardic, borderline febrile with worsening oxygenation status since Christmas day. Home albuterol  inhaler did not provide improvement.  Received IV Solu-Medrol , 2 DuoNeb treatments, and magnesium  sulfate in ED without significant oxygenation improvement, saturating 85% on Harrison. Was placed on 3L nasal cannula, improved to 97% with supplemental oxygen.  No respiratory distress, accessory muscle use, or changes in mentation concerning for further worsening of asthma during initial evaluation. Influenza A positive despite vaccinated status. Started Tamiflu  75mg  twice daily on 12/27 for 5 days given associated benefits for asthmatic patients requiring hospitalization.  Does not see pulmonology outpatient.  Monitored peak expiratory flow rate for daily clinical improvement and risk assessment for deterioration into status asthmaticus.  Encouraged proper PO fluid and food intake as tolerated, given 1L LR for rehydration as patient with decreased PO intake prior to admission. Monitored O2 saturation with goal > 92%, and weaned 3L [place] as tolerated.  Patient able to ambulate while maintaining her saturations on day of discharge.  Discharge with prednisone  40 and Tamiflu  to complete 5-day courses of both, end of therapy 12/31.  Discharged with new ICS+LABA inhaler. Also recommended that patient reach out to her pediatrician to ask about prophylactic flu medication for her 40-month-old.   #Breastfeeding Patient currently 8wks postpartum, electing to breastfeed her baby. On admission, she was without her home breast pumps to continuously lactate. She was concerned about clogs, potential for mastitis, and inability to feed her son. Consulted lactation services for breast pumps during admission or until patient's partner could bring home pumps and storage containers to room. Ordered coconut oil as barrier cream for areola/nipple. Cautious with medications given to patient given potential excretion through breast milk and active breastfeeding.      Subjective She slept ok, woke up on and off. Has an appetite. Denies myalgias. Her breathing has gotten better,  she had a breathing treatment this morning and hasn't been using the [place] since then and feels comfortable. Discussed plan to have her ambulate and check her saturations before discharge. Discussed sending her home with a new inhaler, Symbicort  and also recommended she contact her pediatrician to ask if she should prophylactically treat her 66 month old for the flu because there are now multiple sick family members in the home. Patient agrees and understands.   Discharge Exam:   BP 104/64 (BP Location: Right Arm)   Pulse 85   Temp 98.1 F (36.7 C) (Oral)   Resp 18   SpO2 95%  Discharge exam:   Physical Exam Vitals reviewed.  Constitutional:      Appearance: She is not ill-appearing or diaphoretic.  Eyes:     Pupils: Pupils are equal, round, and reactive to light.  Cardiovascular:     Rate and Rhythm: Normal rate and regular rhythm.  Pulmonary:     Effort: Pulmonary effort is normal on room air. No tachypnea, accessory muscle usage or respiratory distress.     Comments: Mild bilateral wheezing, improving, Left upper >  right upper Skin:    General: Skin is warm.  Neurological:     General: No focal deficit present.     Mental Status: She is alert and oriented to person, place, and time.  Psychiatric:        Mood and Affect: Mood normal.        Behavior: Behavior normal.     Pertinent Labs, Studies, and Procedures:     Latest Ref Rng & Units 06/05/2024    9:02 PM 04/05/2024    5:33 AM 04/04/2024    1:11 AM  CBC  WBC 4.0 - 10.5 K/uL 5.8  20.0  7.8   Hemoglobin 12.0 - 15.0 g/dL 86.7  89.9  88.0   Hematocrit 36.0 - 46.0 % 41.8  30.6  36.2   Platelets 150 - 400 K/uL 235  193  239        Latest Ref Rng & Units 06/07/2024    8:18 AM 06/05/2024    9:02 PM 06/24/2023    8:32 PM  CMP  Glucose 70 - 99 mg/dL 882  889  896   BUN 6 - 20 mg/dL 14  7  9    Creatinine 0.44 - 1.00 mg/dL 9.38  9.39  9.24   Sodium 135 - 145 mmol/L 139  135  139   Potassium 3.5 - 5.1 mmol/L 4.2  4.5  4.6    Chloride 98 - 111 mmol/L 103  100  106   CO2 22 - 32 mmol/L 27  22  25    Calcium 8.9 - 10.3 mg/dL 9.6  9.7  9.7     DG Chest 2 View Result Date: 06/05/2024 EXAM: 2 VIEW(S) XRAY OF THE CHEST 06/05/2024 09:31:00 PM COMPARISON: PA lateral 11/25/2023. CLINICAL HISTORY: Shortness of breath, coughing, and chest pain for 3 days. Sick contact at home with the flu. FINDINGS: LUNGS AND PLEURA: Central bronchial thickening in both lungs, without evidence of focal pneumonia. Findings consistent with bronchitis. No pleural effusion. No pneumothorax. HEART AND MEDIASTINUM: No acute abnormality of the cardiac and mediastinal silhouettes. BONES AND SOFT TISSUES: No acute osseous abnormality. IMPRESSION: 1. Findings consistent with bronchitis. 2. No focal pneumonia. Electronically signed by: Francis Quam MD 06/05/2024 10:51 PM EST RP Workstation: [id number]     Discharge Instructions: Discharge Instructions     For home use only DME Nebulizer machine   Complete by: As directed    Patient needs a nebulizer to treat with the following condition: Asthma exacerbation   Length of Need: 6 Months   Additional equipment included:  Filter Administration kit         Signed: Charmayne Holmes, DO 06/07/2024, 12:35 PM     "

## 2024-06-07 NOTE — TOC Transition Note (Addendum)
 Transition of Care [facility]) - Discharge Note   Patient Details  Name: Cassandra Harrison MRN: [id number] Date of Birth: 1991-04-23  Transition of Care [facility]) CM/SW Contact:  Robynn Eileen Hoose, RN Phone Number: 06/07/2024, 1:40 PM   Clinical Narrative:   Patient is being discharged today. Nebulizer ordered through Jermaine with [employer] to be delivered to patient bedside before discharging home.  1357: Per Jermaine, currently out of nebulizers. Order sent to [employer] through [employer] portal and phone call made to call center.  1430: Call to [employer] to check status of nebulizer delivery. Per rep order still processing, request return call in 30 mins to check back on status.  1550: Call to [employer] to get update on nebulizer delivery. Per rep order was dispatched out, waiting for delivery driver.    Final next level of care: Home/Self Care Barriers to Discharge: No Barriers Identified   Patient Goals and CMS Choice            Discharge Placement                       Discharge Plan and Services Additional resources added to the After Visit Summary for                  DME Arranged: Nebulizer machine DME Agency: [employer] Date DME Agency Contacted: 06/07/24 Time DME Agency Contacted: 1339 Representative spoke with at DME Agency: [place]            Social Drivers of Health (SDOH) Interventions SDOH Screenings   Food Insecurity: No Food Insecurity (06/06/2024)  Housing: Low Risk (06/06/2024)  Transportation Needs: No Transportation Needs (06/06/2024)  Utilities: Not At Risk (06/06/2024)  Depression (PHQ2-9): Low Risk (01/28/2024)  Tobacco Use: High Risk (04/04/2024)     Readmission Risk Interventions     No data to display

## 2024-06-07 NOTE — Plan of Care (Signed)

## 2024-06-07 NOTE — Progress Notes (Signed)
 SATURATION QUALIFICATIONS: (This note is used to comply with regulatory documentation for home oxygen)  Patient Saturations on Room Air at Rest = 94%  Patient Saturations on Room Air while Ambulating was between 94% and 96%.   Patient walked approximately 130 ft and felt out of breath so we had to conclude the o2 walking test.

## 2024-06-08 ENCOUNTER — Telehealth: Payer: Self-pay

## 2024-06-08 NOTE — Telephone Encounter (Signed)
 Prior Authorization for patient (Budesonide -Formoterol  Fumarate 80-4.5MCG/ACT aerosol) came through on cover my meds was submitted awaiting approval or denial.  XZB:[id number]

## 2024-06-08 NOTE — Telephone Encounter (Signed)
 Cassandra Harrison (Key: [id number]) PA Case ID #: [id number] Need Help? Call us  at [phone] Archived today by you Outcome N/A today by [employer] 2017 General.Closed by health plan.We thank you for taking the time to submit your request. However, this request has been closed because you told us  on 06/07/2024 that the member's medication has been switched to a recommended formulary alternative, brand name Symbicort . Drug Budesonide -Formoterol  Fumarate 80-4.5MCG/ACT aerosol ePA cloud logo Form [employer] Electronic Prior Authorization Form
